# Patient Record
Sex: Female | Born: 1945 | Race: White | Hispanic: No | Marital: Single | Smoking: Never smoker
Health system: Southern US, Community
[De-identification: ages and names within clinical notes are randomized; demographics above are authoritative.]

## PROBLEM LIST (undated history)

## (undated) DIAGNOSIS — T4145XA Adverse effect of unspecified anesthetic, initial encounter: Secondary | ICD-10-CM

## (undated) DIAGNOSIS — F419 Anxiety disorder, unspecified: Secondary | ICD-10-CM

## (undated) DIAGNOSIS — T8149XA Infection following a procedure, other surgical site, initial encounter: Secondary | ICD-10-CM

## (undated) DIAGNOSIS — N8110 Cystocele, unspecified: Secondary | ICD-10-CM

## (undated) DIAGNOSIS — K219 Gastro-esophageal reflux disease without esophagitis: Secondary | ICD-10-CM

## (undated) DIAGNOSIS — F32A Depression, unspecified: Secondary | ICD-10-CM

## (undated) DIAGNOSIS — E46 Unspecified protein-calorie malnutrition: Secondary | ICD-10-CM

## (undated) DIAGNOSIS — K651 Peritoneal abscess: Secondary | ICD-10-CM

## (undated) DIAGNOSIS — N951 Menopausal and female climacteric states: Secondary | ICD-10-CM

## (undated) DIAGNOSIS — M47816 Spondylosis without myelopathy or radiculopathy, lumbar region: Secondary | ICD-10-CM

## (undated) DIAGNOSIS — K623 Rectal prolapse: Secondary | ICD-10-CM

## (undated) DIAGNOSIS — M1990 Unspecified osteoarthritis, unspecified site: Secondary | ICD-10-CM

## (undated) DIAGNOSIS — T8143XA Infection following a procedure, organ and space surgical site, initial encounter: Secondary | ICD-10-CM

## (undated) DIAGNOSIS — K56609 Unspecified intestinal obstruction, unspecified as to partial versus complete obstruction: Secondary | ICD-10-CM

## (undated) DIAGNOSIS — T8859XA Other complications of anesthesia, initial encounter: Secondary | ICD-10-CM

## (undated) DIAGNOSIS — E039 Hypothyroidism, unspecified: Secondary | ICD-10-CM

## (undated) DIAGNOSIS — IMO0002 Reserved for concepts with insufficient information to code with codable children: Secondary | ICD-10-CM

## (undated) DIAGNOSIS — M810 Age-related osteoporosis without current pathological fracture: Secondary | ICD-10-CM

## (undated) HISTORY — PX: TONSILLECTOMY: SUR1361

## (undated) HISTORY — PX: OTHER SURGICAL HISTORY: SHX169

## (undated) HISTORY — PX: ABDOMINAL HYSTERECTOMY: SHX81

## (undated) HISTORY — PX: ABDOMINAL SURGERY: SHX537

---

## 1999-06-29 ENCOUNTER — Other Ambulatory Visit: Admission: RE | Admit: 1999-06-29 | Discharge: 1999-06-29 | Payer: Self-pay | Admitting: Obstetrics and Gynecology

## 2000-07-14 ENCOUNTER — Other Ambulatory Visit: Admission: RE | Admit: 2000-07-14 | Discharge: 2000-07-14 | Payer: Self-pay | Admitting: Internal Medicine

## 2002-09-11 ENCOUNTER — Ambulatory Visit (HOSPITAL_COMMUNITY): Admission: RE | Admit: 2002-09-11 | Discharge: 2002-09-11 | Payer: Self-pay | Admitting: Obstetrics & Gynecology

## 2002-09-11 ENCOUNTER — Encounter: Payer: Self-pay | Admitting: Obstetrics & Gynecology

## 2005-06-21 ENCOUNTER — Ambulatory Visit: Payer: Self-pay | Admitting: Orthopedic Surgery

## 2006-10-06 ENCOUNTER — Encounter: Admission: RE | Admit: 2006-10-06 | Discharge: 2006-10-06 | Payer: Self-pay | Admitting: Obstetrics & Gynecology

## 2007-10-11 ENCOUNTER — Encounter: Admission: RE | Admit: 2007-10-11 | Discharge: 2007-10-11 | Payer: Self-pay | Admitting: Family Medicine

## 2008-10-29 ENCOUNTER — Encounter: Admission: RE | Admit: 2008-10-29 | Discharge: 2008-10-29 | Payer: Self-pay | Admitting: Family Medicine

## 2009-11-03 ENCOUNTER — Encounter: Admission: RE | Admit: 2009-11-03 | Discharge: 2009-11-03 | Payer: Self-pay | Admitting: Obstetrics & Gynecology

## 2010-05-04 ENCOUNTER — Encounter: Payer: Self-pay | Admitting: Family Medicine

## 2010-08-25 ENCOUNTER — Other Ambulatory Visit: Payer: Self-pay | Admitting: Family Medicine

## 2010-08-25 DIAGNOSIS — Z1231 Encounter for screening mammogram for malignant neoplasm of breast: Secondary | ICD-10-CM

## 2010-11-05 ENCOUNTER — Ambulatory Visit
Admission: RE | Admit: 2010-11-05 | Discharge: 2010-11-05 | Disposition: A | Discharge status: Home / Self Care | Payer: BC Managed Care – PPO | Source: Ambulatory Visit | Attending: Family Medicine | Admitting: Family Medicine

## 2010-11-05 DIAGNOSIS — Z1231 Encounter for screening mammogram for malignant neoplasm of breast: Secondary | ICD-10-CM

## 2011-08-30 ENCOUNTER — Other Ambulatory Visit: Payer: Self-pay | Admitting: Family Medicine

## 2011-08-30 DIAGNOSIS — Z1231 Encounter for screening mammogram for malignant neoplasm of breast: Secondary | ICD-10-CM

## 2011-10-07 ENCOUNTER — Telehealth (HOSPITAL_COMMUNITY): Payer: Self-pay | Admitting: Dietician

## 2011-10-07 NOTE — Telephone Encounter (Signed)
Pt called inquiring about services. She reports that she is pre-diabetic. Offered diabetes class to pt, but pt declined. Provided simple guidance on diabetic diet over telephone, emphasizing plate method and sources of carbohydrates. Discussed nutritional content of commonly eaten foods. Encouraged pt to limit foods in refined carbohydrates in diet. Pt also requesting written information to be sent to her home. Sent plate method handout and diabetes class flyer to pt home via [place] [employer].

## 2011-11-08 ENCOUNTER — Ambulatory Visit
Admission: RE | Admit: 2011-11-08 | Discharge: 2011-11-08 | Disposition: A | Discharge status: Home / Self Care | Payer: Medicare Other | Source: Ambulatory Visit | Attending: Family Medicine | Admitting: Family Medicine

## 2011-11-08 DIAGNOSIS — Z1231 Encounter for screening mammogram for malignant neoplasm of breast: Secondary | ICD-10-CM

## 2011-12-06 ENCOUNTER — Other Ambulatory Visit (HOSPITAL_COMMUNITY): Payer: Self-pay | Admitting: Family Medicine

## 2011-12-06 DIAGNOSIS — Z139 Encounter for screening, unspecified: Secondary | ICD-10-CM

## 2011-12-07 ENCOUNTER — Ambulatory Visit (HOSPITAL_COMMUNITY)
Admission: RE | Admit: 2011-12-07 | Discharge: 2011-12-07 | Disposition: A | Discharge status: Home / Self Care | Payer: Medicare Other | Source: Ambulatory Visit | Attending: Family Medicine | Admitting: Family Medicine

## 2011-12-07 DIAGNOSIS — Z139 Encounter for screening, unspecified: Secondary | ICD-10-CM

## 2011-12-07 DIAGNOSIS — M818 Other osteoporosis without current pathological fracture: Secondary | ICD-10-CM | POA: Insufficient documentation

## 2011-12-07 DIAGNOSIS — Z13820 Encounter for screening for osteoporosis: Secondary | ICD-10-CM | POA: Insufficient documentation

## 2011-12-08 ENCOUNTER — Other Ambulatory Visit (HOSPITAL_COMMUNITY): Payer: Medicare Other

## 2011-12-22 DIAGNOSIS — R109 Unspecified abdominal pain: Secondary | ICD-10-CM

## 2012-01-10 ENCOUNTER — Encounter (HOSPITAL_COMMUNITY): Payer: Self-pay | Admitting: *Deleted

## 2012-01-10 ENCOUNTER — Emergency Department (HOSPITAL_COMMUNITY): Payer: Medicare Other

## 2012-01-10 ENCOUNTER — Inpatient Hospital Stay (HOSPITAL_COMMUNITY)
Admission: EM | Admit: 2012-01-10 | Discharge: 2012-01-18 | DRG: 862 | Disposition: A | Discharge status: Home Health | Payer: Medicare Other | Attending: Internal Medicine | Admitting: Internal Medicine

## 2012-01-10 DIAGNOSIS — M7989 Other specified soft tissue disorders: Secondary | ICD-10-CM

## 2012-01-10 DIAGNOSIS — T8140XA Infection following a procedure, unspecified, initial encounter: Principal | ICD-10-CM | POA: Diagnosis present

## 2012-01-10 DIAGNOSIS — E860 Dehydration: Secondary | ICD-10-CM | POA: Diagnosis present

## 2012-01-10 DIAGNOSIS — D649 Anemia, unspecified: Secondary | ICD-10-CM

## 2012-01-10 DIAGNOSIS — Z885 Allergy status to narcotic agent status: Secondary | ICD-10-CM

## 2012-01-10 DIAGNOSIS — D473 Essential (hemorrhagic) thrombocythemia: Secondary | ICD-10-CM | POA: Diagnosis present

## 2012-01-10 DIAGNOSIS — R799 Abnormal finding of blood chemistry, unspecified: Secondary | ICD-10-CM | POA: Diagnosis present

## 2012-01-10 DIAGNOSIS — E876 Hypokalemia: Secondary | ICD-10-CM | POA: Diagnosis present

## 2012-01-10 DIAGNOSIS — Y836 Removal of other organ (partial) (total) as the cause of abnormal reaction of the patient, or of later complication, without mention of misadventure at the time of the procedure: Secondary | ICD-10-CM | POA: Diagnosis present

## 2012-01-10 DIAGNOSIS — Y92009 Unspecified place in unspecified non-institutional (private) residence as the place of occurrence of the external cause: Secondary | ICD-10-CM

## 2012-01-10 DIAGNOSIS — M810 Age-related osteoporosis without current pathological fracture: Secondary | ICD-10-CM | POA: Diagnosis present

## 2012-01-10 DIAGNOSIS — D509 Iron deficiency anemia, unspecified: Secondary | ICD-10-CM | POA: Diagnosis present

## 2012-01-10 DIAGNOSIS — R609 Edema, unspecified: Secondary | ICD-10-CM | POA: Diagnosis not present

## 2012-01-10 DIAGNOSIS — A498 Other bacterial infections of unspecified site: Secondary | ICD-10-CM | POA: Diagnosis present

## 2012-01-10 DIAGNOSIS — Z90710 Acquired absence of both cervix and uterus: Secondary | ICD-10-CM

## 2012-01-10 DIAGNOSIS — K651 Peritoneal abscess: Secondary | ICD-10-CM | POA: Diagnosis present

## 2012-01-10 DIAGNOSIS — E43 Unspecified severe protein-calorie malnutrition: Secondary | ICD-10-CM | POA: Diagnosis present

## 2012-01-10 DIAGNOSIS — K649 Unspecified hemorrhoids: Secondary | ICD-10-CM | POA: Diagnosis present

## 2012-01-10 DIAGNOSIS — M1990 Unspecified osteoarthritis, unspecified site: Secondary | ICD-10-CM | POA: Diagnosis present

## 2012-01-10 DIAGNOSIS — R197 Diarrhea, unspecified: Secondary | ICD-10-CM | POA: Diagnosis present

## 2012-01-10 DIAGNOSIS — R109 Unspecified abdominal pain: Secondary | ICD-10-CM

## 2012-01-10 DIAGNOSIS — Z79899 Other long term (current) drug therapy: Secondary | ICD-10-CM

## 2012-01-10 DIAGNOSIS — Z7982 Long term (current) use of aspirin: Secondary | ICD-10-CM

## 2012-01-10 DIAGNOSIS — Y9210 Unspecified residential institution as the place of occurrence of the external cause: Secondary | ICD-10-CM | POA: Diagnosis present

## 2012-01-10 DIAGNOSIS — E46 Unspecified protein-calorie malnutrition: Secondary | ICD-10-CM | POA: Diagnosis present

## 2012-01-10 DIAGNOSIS — IMO0002 Reserved for concepts with insufficient information to code with codable children: Secondary | ICD-10-CM

## 2012-01-10 HISTORY — DX: Unspecified osteoarthritis, unspecified site: M19.90

## 2012-01-10 HISTORY — DX: Age-related osteoporosis without current pathological fracture: M81.0

## 2012-01-10 LAB — URINALYSIS, ROUTINE W REFLEX MICROSCOPIC
Leukocytes, UA: NEGATIVE
Nitrite: NEGATIVE
Specific Gravity, Urine: 1.01 (ref 1.005–1.030)
pH: 6.5 (ref 5.0–8.0)

## 2012-01-10 LAB — COMPREHENSIVE METABOLIC PANEL
Albumin: 1.9 g/dL — ABNORMAL LOW (ref 3.5–5.2)
Alkaline Phosphatase: 106 U/L (ref 39–117)
BUN: 7 mg/dL (ref 6–23)
Chloride: 91 mEq/L — ABNORMAL LOW (ref 96–112)
Glucose, Bld: 98 mg/dL (ref 70–99)
Potassium: 2 mEq/L — CL (ref 3.5–5.1)
Total Bilirubin: 0.3 mg/dL (ref 0.3–1.2)

## 2012-01-10 LAB — CBC WITH DIFFERENTIAL/PLATELET
Basophils Relative: 0 % (ref 0–1)
Hemoglobin: 10.4 g/dL — ABNORMAL LOW (ref 12.0–15.0)
Lymphs Abs: 1 10*3/uL (ref 0.7–4.0)
Monocytes Relative: 8 % (ref 3–12)
Neutro Abs: 14.2 10*3/uL — ABNORMAL HIGH (ref 1.7–7.7)
Neutrophils Relative %: 86 % — ABNORMAL HIGH (ref 43–77)
RBC: 3.59 MIL/uL — ABNORMAL LOW (ref 3.87–5.11)

## 2012-01-10 LAB — LIPASE, BLOOD: Lipase: 64 U/L — ABNORMAL HIGH (ref 11–59)

## 2012-01-10 MED ORDER — HYDROMORPHONE HCL PF 1 MG/ML IJ SOLN
1.0000 mg | Freq: Once | INTRAMUSCULAR | Status: AC
  Administered 2012-01-10: 1 mg via INTRAVENOUS
  Filled 2012-01-10: qty 1

## 2012-01-10 MED ORDER — SODIUM CHLORIDE 0.9 % IV BOLUS (SEPSIS)
1000.0000 mL | Freq: Once | INTRAVENOUS | Status: AC
  Administered 2012-01-10: 1000 mL via INTRAVENOUS

## 2012-01-10 MED ORDER — POTASSIUM CHLORIDE 10 MEQ/100ML IV SOLN
10.0000 meq | Freq: Once | INTRAVENOUS | Status: AC
  Administered 2012-01-11: 10 meq via INTRAVENOUS
  Filled 2012-01-10: qty 100

## 2012-01-10 MED ORDER — SODIUM CHLORIDE 0.9 % IV SOLN
INTRAVENOUS | Status: DC
  Administered 2012-01-10: 23:00:00 via INTRAVENOUS

## 2012-01-10 MED ORDER — ONDANSETRON HCL 4 MG/2ML IJ SOLN
4.0000 mg | Freq: Once | INTRAMUSCULAR | Status: AC
  Administered 2012-01-10: 4 mg via INTRAVENOUS
  Filled 2012-01-10: qty 2

## 2012-01-10 MED ORDER — IOHEXOL 300 MG/ML  SOLN
100.0000 mL | Freq: Once | INTRAMUSCULAR | Status: AC | PRN
  Administered 2012-01-10: 100 mL via INTRAVENOUS

## 2012-01-10 NOTE — ED Provider Notes (Addendum)
History    This chart was scribed for Shelda Jakes, MD, MD by Smitty Pluck. The patient was seen in room APA09 and the patient's care was started at 9:20PM.   CSN: [id number]  Arrival date & time 01/10/12  1756      Chief Complaint  Patient presents with  . Abdominal Pain    (Consider location/radiation/quality/duration/timing/severity/associated sxs/prior treatment) Patient is a 66 y.o. female presenting with abdominal pain. The history is provided by the patient. No language interpreter was used.  Abdominal Pain The primary symptoms of the illness include abdominal pain, fever, nausea and diarrhea. The primary symptoms of the illness do not include shortness of breath. The current episode started more than 2 days ago. The onset of the illness was sudden. The problem has been gradually worsening.  The fever began more than 1 week ago. The maximum temperature recorded prior to her arrival was 100 to 100.9 F.  Nausea began more than 1 week ago.  The diarrhea began more than 1 week ago. The diarrhea is watery. The diarrhea occurs 5 to 10 times per day.   Yvette Branch is a 66 y.o. female who presents to the Emergency Department complaining of constant, moderate abdominal pain, fever and diarrhea onset 2 weeks ago. Pt has had 6 bowel movements today. Pt had home nurse come to her house today and was told her fever had temperature 100 today.  Pt had bowel obstruction surgery (adhesions) 10 days ago by [facility] at [place]. The symptoms have gotten worse since the surgery. Pt reports having decreased appetite. She states her abdominal pain is aggravated when she urinates. Pt reports having hysterectomy in the past.   PCP is Dr. Phillips Odor   Past Medical History  Diagnosis Date  . Arthritis   . Osteoporosis     Past Surgical History  Procedure Date  . Abdominal hysterectomy   . Abdominal surgery   . Bowel obstruction     History reviewed. No pertinent family history.  History    Substance Use Topics  . Smoking status: Never Smoker   . Smokeless tobacco: Not on file  . Alcohol Use: No    OB History    Grav Para Term Preterm Abortions TAB SAB Ect Mult Living                  Review of Systems  Constitutional: Positive for fever.  HENT: Negative for congestion and sore throat.   Respiratory: Negative for cough and shortness of breath.   Gastrointestinal: Positive for nausea, abdominal pain and diarrhea.  Skin: Negative for rash.  Neurological: Negative for numbness and headaches.  All other systems reviewed and are negative.    Allergies  Hydrocodone  Home Medications     BP 126/69  Pulse 86  Temp 101.5 F (38.6 C) (Rectal)  Resp 17  Ht 5\' 2"  (1.575 m)  Wt 100 lb (45.36 kg)  BMI 18.29 kg/m2  SpO2 96%  Physical Exam  Nursing note and vitals reviewed. Constitutional: She is oriented to person, place, and time. She appears well-developed and well-nourished. No distress.  HENT:  Head: Normocephalic and atraumatic.  Mouth/Throat: Mucous membranes are dry.  Cardiovascular: Normal rate, regular rhythm and normal heart sounds.   No murmur heard. Pulmonary/Chest: Effort normal and breath sounds normal. No respiratory distress. She has no wheezes. She has no rales.  Abdominal: Bowel sounds are decreased. There is no tenderness.       8 cm long incision  No redness No signs of infection   Musculoskeletal: Normal range of motion.  Lymphadenopathy:    She has no cervical adenopathy.  Neurological: She is alert and oriented to person, place, and time. No cranial nerve deficit.  Skin: Skin is warm and dry.  Psychiatric: She has a normal mood and affect. Her behavior is normal.    ED Course  Procedures (including critical care time) DIAGNOSTIC STUDIES: Oxygen Saturation is 98% on room air, normal by my interpretation.    COORDINATION OF CARE: 9:35 PM Discussed ED treatment with pt     Labs Reviewed  URINALYSIS, ROUTINE W REFLEX  MICROSCOPIC - Abnormal; Notable for the following:    Ketones, ur 15 (*)     All other components within normal limits  CBC WITH DIFFERENTIAL - Abnormal; Notable for the following:    WBC 16.6 (*)     RBC 3.59 (*)     Hemoglobin 10.4 (*)     HCT 29.9 (*)     Platelets 565 (*)     Neutrophils Relative 86 (*)     Neutro Abs 14.2 (*)     Lymphocytes Relative 6 (*)     Monocytes Absolute 1.3 (*)     All other components within normal limits  COMPREHENSIVE METABOLIC PANEL - Abnormal; Notable for the following:    Sodium 133 (*)     Potassium 2.0 (*)     Chloride 91 (*)     Creatinine, Ser 0.40 (*)     Total Protein 5.6 (*)     Albumin 1.9 (*)     All other components within normal limits  LIPASE, BLOOD - Abnormal; Notable for the following:    Lipase 64 (*)     All other components within normal limits   No results found. Results for orders placed during the hospital encounter of 01/10/12  URINALYSIS, ROUTINE W REFLEX MICROSCOPIC      Component Value Range   Color, Urine YELLOW  YELLOW   APPearance CLEAR  CLEAR   Specific Gravity, Urine 1.010  1.005 - 1.030   pH 6.5  5.0 - 8.0   Glucose, UA NEGATIVE  NEGATIVE mg/dL   Hgb urine dipstick NEGATIVE  NEGATIVE   Bilirubin Urine NEGATIVE  NEGATIVE   Ketones, ur 15 (*) NEGATIVE mg/dL   Protein, ur NEGATIVE  NEGATIVE mg/dL   Urobilinogen, UA 0.2  0.0 - 1.0 mg/dL   Nitrite NEGATIVE  NEGATIVE   Leukocytes, UA NEGATIVE  NEGATIVE  CBC WITH DIFFERENTIAL      Component Value Range   WBC 16.6 (*) 4.0 - 10.5 K/uL   RBC 3.59 (*) 3.87 - 5.11 MIL/uL   Hemoglobin 10.4 (*) 12.0 - 15.0 g/dL   HCT 16.1 (*) 09.6 - 04.5 %   MCV 83.3  78.0 - 100.0 fL   MCH 29.0  26.0 - 34.0 pg   MCHC 34.8  30.0 - 36.0 g/dL   RDW 40.9  81.1 - 91.4 %   Platelets 565 (*) 150 - 400 K/uL   Neutrophils Relative 86 (*) 43 - 77 %   Neutro Abs 14.2 (*) 1.7 - 7.7 K/uL   Lymphocytes Relative 6 (*) 12 - 46 %   Lymphs Abs 1.0  0.7 - 4.0 K/uL   Monocytes Relative 8  3 -  12 %   Monocytes Absolute 1.3 (*) 0.1 - 1.0 K/uL   Eosinophils Relative 0  0 - 5 %   Eosinophils Absolute 0.0  0.0 -  0.7 K/uL   Basophils Relative 0  0 - 1 %   Basophils Absolute 0.0  0.0 - 0.1 K/uL  COMPREHENSIVE METABOLIC PANEL      Component Value Range   Sodium 133 (*) 135 - 145 mEq/L   Potassium 2.0 (*) 3.5 - 5.1 mEq/L   Chloride 91 (*) 96 - 112 mEq/L   CO2 32  19 - 32 mEq/L   Glucose, Bld 98  70 - 99 mg/dL   BUN 7  6 - 23 mg/dL   Creatinine, Ser 1.61 (*) 0.50 - 1.10 mg/dL   Calcium 8.4  8.4 - 09.6 mg/dL   Total Protein 5.6 (*) 6.0 - 8.3 g/dL   Albumin 1.9 (*) 3.5 - 5.2 g/dL   AST 22  0 - 37 U/L   ALT 19  0 - 35 U/L   Alkaline Phosphatase 106  39 - 117 U/L   Total Bilirubin 0.3  0.3 - 1.2 mg/dL   GFR calc non Af Amer >90  >90 mL/min   GFR calc Af Amer >90  >90 mL/min  LIPASE, BLOOD      Component Value Range   Lipase 64 (*) 11 - 59 U/L    Date: 01/10/2012  Rate: 100  Rhythm: normal sinus rhythm  QRS Axis: normal  Intervals: normal  ST/T Wave abnormalities: nonspecific T wave changes  Conduction Disutrbances:none  Narrative Interpretation:   Old EKG Reviewed: none available    1. Abdominal pain   2. Hypokalemia       MDM  And patient's CT scan is still pending. It will be important in sorting out was going with abdominal pain nausea and diarrhea and the history of fever at home. Patient has marked hypokalemia along with hyponatremia hypokalemia is being treated with 10 mEq of potassium chloride x2. Patient is on cardiac monitor. Patient also has a moderate leukocytosis of 16,000. Patient will require admission even if CT scan is negative. Pending results of CT scan the surgical consultation may be required. Patient's surgery was done at [facility]. For the hypernatremia patient's been treated with 1 L of normal saline and has a maintenance rate as well to slowly correct that. Patient's urinalysis is negative liver function tests without sniffing abnormalities of the  lipase being mildly elevated at 64. This could represent some mild pancreatitis.     I personally performed the services described in this documentation, which was scribed in my presence. The recorded information has been reviewed and considered.      Shelda Jakes, MD 01/10/12 2354   Addendum: CT scan shows a large pelvic abscess measuring about 10 cm in size. Discussed with her surgeon in the evening he's okay with her being admitted here contacted Franky Macho from general surgery for consultation purposes also discussed patient with the admitting hospitalist we will admit here to step down unit have ordered blood cultures x2 lactate and pro calcitonin. Hospitals will decide which antibiotics. The pelvic abscess will most likely be drained percutaneously via CT direction by interventional radiology. All this is explained to the patient and she is in agreement with staying here. Patient did develop a fever here.  Shelda Jakes, MD 01/11/12 0120

## 2012-01-10 NOTE — ED Notes (Signed)
abd pain,fever, diarrhea, nausea, no vomiting, Had surgery for bowel obst at [facility] 2 weeks ago

## 2012-01-11 ENCOUNTER — Encounter (HOSPITAL_COMMUNITY): Payer: Self-pay | Admitting: Internal Medicine

## 2012-01-11 DIAGNOSIS — T8140XA Infection following a procedure, unspecified, initial encounter: Principal | ICD-10-CM

## 2012-01-11 DIAGNOSIS — E876 Hypokalemia: Secondary | ICD-10-CM | POA: Diagnosis present

## 2012-01-11 DIAGNOSIS — E860 Dehydration: Secondary | ICD-10-CM | POA: Diagnosis present

## 2012-01-11 DIAGNOSIS — D509 Iron deficiency anemia, unspecified: Secondary | ICD-10-CM | POA: Diagnosis present

## 2012-01-11 DIAGNOSIS — E46 Unspecified protein-calorie malnutrition: Secondary | ICD-10-CM | POA: Diagnosis present

## 2012-01-11 DIAGNOSIS — R109 Unspecified abdominal pain: Secondary | ICD-10-CM

## 2012-01-11 DIAGNOSIS — K651 Peritoneal abscess: Secondary | ICD-10-CM

## 2012-01-11 DIAGNOSIS — R197 Diarrhea, unspecified: Secondary | ICD-10-CM

## 2012-01-11 LAB — IRON AND TIBC: Iron: <10 ug/dL — ABNORMAL LOW (ref 42–135)

## 2012-01-11 LAB — COMPREHENSIVE METABOLIC PANEL
CO2: 29 mEq/L (ref 19–32)
Calcium: 7.6 mg/dL — ABNORMAL LOW (ref 8.4–10.5)
Chloride: 94 mEq/L — ABNORMAL LOW (ref 96–112)
Creatinine, Ser: 0.4 mg/dL — ABNORMAL LOW (ref 0.50–1.10)
GFR calc Af Amer: >90 mL/min (ref >90)
GFR calc non Af Amer: >90 mL/min (ref >90)
Glucose, Bld: 107 mg/dL — ABNORMAL HIGH (ref 70–99)
Total Bilirubin: 0.3 mg/dL (ref 0.3–1.2)

## 2012-01-11 LAB — CBC
Hemoglobin: 9.3 g/dL — ABNORMAL LOW (ref 12.0–15.0)
MCHC: 34.6 g/dL (ref 30.0–36.0)
Platelets: 566 10*3/uL — ABNORMAL HIGH (ref 150–400)
RBC: 3.2 MIL/uL — ABNORMAL LOW (ref 3.87–5.11)

## 2012-01-11 LAB — APTT: aPTT: 33 seconds (ref 24–37)

## 2012-01-11 LAB — FOLATE: Folate: 11.6 ng/mL

## 2012-01-11 LAB — LACTIC ACID, PLASMA: Lactic Acid, Venous: 0.8 mmol/L (ref 0.5–2.2)

## 2012-01-11 LAB — PROCALCITONIN: Procalcitonin: 4.22 ng/mL

## 2012-01-11 LAB — PROTIME-INR: INR: 1.08 (ref 0.00–1.49)

## 2012-01-11 LAB — VITAMIN B12: Vitamin B-12: 1534 pg/mL — ABNORMAL HIGH (ref 211–911)

## 2012-01-11 MED ORDER — ACETAMINOPHEN 325 MG PO TABS: 650.0000 mg | ORAL_TABLET | Freq: Four times a day (QID) | ORAL | Status: DC | PRN

## 2012-01-11 MED ORDER — ONDANSETRON HCL 4 MG PO TABS
4.0000 mg | ORAL_TABLET | Freq: Four times a day (QID) | ORAL | Status: DC | PRN
  Administered 2012-01-13: 4 mg via ORAL
  Filled 2012-01-11: qty 1

## 2012-01-11 MED ORDER — ACETAMINOPHEN 650 MG RE SUPP: 650.0000 mg | Freq: Four times a day (QID) | RECTAL | Status: DC | PRN

## 2012-01-11 MED ORDER — MAGNESIUM SULFATE 40 MG/ML IJ SOLN
2.0000 g | Freq: Once | INTRAMUSCULAR | Status: AC
  Administered 2012-01-11: 2 g via INTRAVENOUS
  Filled 2012-01-11: qty 50

## 2012-01-11 MED ORDER — PIPERACILLIN-TAZOBACTAM 3.375 G IVPB
3.3750 g | Freq: Three times a day (TID) | INTRAVENOUS | Status: DC
  Administered 2012-01-11 – 2012-01-15 (×13): 3.375 g via INTRAVENOUS
  Filled 2012-01-11 (×20): qty 50

## 2012-01-11 MED ORDER — METRONIDAZOLE IN NACL 5-0.79 MG/ML-% IV SOLN
500.0000 mg | Freq: Three times a day (TID) | INTRAVENOUS | Status: DC
  Administered 2012-01-11 – 2012-01-14 (×8): 500 mg via INTRAVENOUS
  Filled 2012-01-11 (×10): qty 100

## 2012-01-11 MED ORDER — POTASSIUM CHLORIDE 10 MEQ/100ML IV SOLN
10.0000 meq | INTRAVENOUS | Status: AC
  Administered 2012-01-11 (×4): 10 meq via INTRAVENOUS
  Filled 2012-01-11: qty 300
  Filled 2012-01-11: qty 100

## 2012-01-11 MED ORDER — PIPERACILLIN-TAZOBACTAM 3.375 G IVPB: 3.3750 g | Freq: Once | INTRAVENOUS | Status: DC

## 2012-01-11 MED ORDER — ONDANSETRON HCL 4 MG/2ML IJ SOLN
4.0000 mg | Freq: Four times a day (QID) | INTRAMUSCULAR | Status: DC | PRN
  Administered 2012-01-12 – 2012-01-14 (×7): 4 mg via INTRAVENOUS
  Filled 2012-01-11 (×8): qty 2

## 2012-01-11 MED ORDER — POTASSIUM CHLORIDE IN NACL 20-0.9 MEQ/L-% IV SOLN
INTRAVENOUS | Status: DC
  Administered 2012-01-11: 100 mL/h via INTRAVENOUS
  Administered 2012-01-11 – 2012-01-12 (×4): via INTRAVENOUS
  Administered 2012-01-13: 1000 mL via INTRAVENOUS
  Administered 2012-01-14: 20 mL/h via INTRAVENOUS

## 2012-01-11 MED ORDER — ALPRAZOLAM 0.25 MG PO TABS
0.2500 mg | ORAL_TABLET | Freq: Four times a day (QID) | ORAL | Status: DC | PRN
  Administered 2012-01-11 – 2012-01-18 (×20): 0.25 mg via ORAL
  Filled 2012-01-11 (×22): qty 1

## 2012-01-11 MED ORDER — VANCOMYCIN HCL 1000 MG IV SOLR
750.0000 mg | Freq: Two times a day (BID) | INTRAVENOUS | Status: DC
  Administered 2012-01-11 – 2012-01-12 (×2): 750 mg via INTRAVENOUS
  Filled 2012-01-11 (×4): qty 750

## 2012-01-11 MED ORDER — HYDROMORPHONE HCL PF 1 MG/ML IJ SOLN
1.0000 mg | INTRAMUSCULAR | Status: DC | PRN
  Administered 2012-01-11 – 2012-01-18 (×19): 1 mg via INTRAVENOUS
  Filled 2012-01-11 (×4): qty 1
  Filled 2012-01-11: qty 5
  Filled 2012-01-11 (×3): qty 1
  Filled 2012-01-11: qty 10
  Filled 2012-01-11: qty 5
  Filled 2012-01-11 (×4): qty 1
  Filled 2012-01-11: qty 15
  Filled 2012-01-11: qty 1
  Filled 2012-01-11: qty 15
  Filled 2012-01-11: qty 1
  Filled 2012-01-11: qty 15
  Filled 2012-01-11: qty 1
  Filled 2012-01-11: qty 15
  Filled 2012-01-11 (×2): qty 1
  Filled 2012-01-11: qty 20
  Filled 2012-01-11: qty 1
  Filled 2012-01-11: qty 20
  Filled 2012-01-11 (×2): qty 10
  Filled 2012-01-11: qty 1
  Filled 2012-01-11 (×2): qty 15
  Filled 2012-01-11 (×2): qty 1

## 2012-01-11 MED ORDER — SODIUM CHLORIDE 0.9 % IJ SOLN
3.0000 mL | Freq: Two times a day (BID) | INTRAMUSCULAR | Status: DC
  Administered 2012-01-11 – 2012-01-12 (×2): 3 mL via INTRAVENOUS

## 2012-01-11 MED ORDER — POTASSIUM CHLORIDE 10 MEQ/100ML IV SOLN
10.0000 meq | INTRAVENOUS | Status: AC
  Administered 2012-01-11 (×4): 10 meq via INTRAVENOUS
  Filled 2012-01-11: qty 400

## 2012-01-11 MED ORDER — VANCOMYCIN HCL IN DEXTROSE 1-5 GM/200ML-% IV SOLN
1000.0000 mg | Freq: Once | INTRAVENOUS | Status: DC
  Administered 2012-01-11: 1000 mg via INTRAVENOUS
  Filled 2012-01-11: qty 200

## 2012-01-11 MED ORDER — SODIUM CHLORIDE 0.9 % IV SOLN: INTRAVENOUS | Status: DC

## 2012-01-11 MED ORDER — ALBUTEROL SULFATE (5 MG/ML) 0.5% IN NEBU: 2.5000 mg | INHALATION_SOLUTION | RESPIRATORY_TRACT | Status: DC | PRN

## 2012-01-11 NOTE — Progress Notes (Signed)
Patient admitted earlier this morning by Dr. Rito Ehrlich.  Patient seen and examined, database reviewed.  She was recently admitted and treated at [facility] for a bowel obstruction and undergone surgical intervention. She had gone home, had continued abdominal pain, and developed diarrhea. She came back to the emergency room where she was noted to be markedly hypokalemic, had a significant leukocytosis, and CT scan of the abdomen and pelvis indicated intra-abdominal abscess. She's been started on broad-spectrum antibiotics. Stool C. difficile pending. We'll empirically start the patient on Flagyl. Potassium is being replaced. We'll await further recommendations surgery.

## 2012-01-11 NOTE — Progress Notes (Signed)
ANTIBIOTIC CONSULT NOTE - INITIAL  Pharmacy Consult for Zosyn & vancomycin Indication:   Intra-abdominal abcess  Allergies  Allergen Reactions  . Hydrocodone Other (See Comments)    REACTION: causes pains in stomach    Patient Measurements: Height: 5\' 2"  (157.5 cm) Weight: 118 lb 9.7 oz (53.8 kg) IBW/kg (Calculated) : 50.1    Vital Signs: Temp: 101.5 F (38.6 C) (09/30 2250) Temp src: Rectal (09/30 2250) BP: 124/64 mmHg (10/01 0115) Pulse Rate: 99  (10/01 0300) Intake/Output from previous day:   Intake/Output from this shift:    Labs:  [facility] 01/10/12 2152  WBC 16.6*  HGB 10.4*  PLT 565*  LABCREA --  CREATININE 0.40*   Estimated Creatinine Clearance: 55.4 ml/min (by C-G formula based on Cr of 0.4).   Microbiology: Recent Results (from the past 720 hour(s))  CULTURE, BLOOD (ROUTINE X 2)     Status: Normal (Preliminary result)   Collection Time   01/11/12  1:11 AM      Component Value Range Status Comment   Specimen Description Blood RIGHT ARM   Final    Special Requests BOTTLES DRAWN AEROBIC AND ANAEROBIC 6 CC EACH   Final    Culture PENDING   Incomplete    Report Status PENDING   Incomplete   CULTURE, BLOOD (ROUTINE X 2)     Status: Normal (Preliminary result)   Collection Time   01/11/12  1:12 AM      Component Value Range Status Comment   Specimen Description Blood LEFT HAND   Final    Special Requests BOTTLES DRAWN AEROBIC AND ANAEROBIC 6 CC EACH   Final    Culture PENDING   Incomplete    Report Status PENDING   Incomplete     Medical History: Past Medical History  Diagnosis Date  . Arthritis   . Osteoporosis     Medications:  Scheduled:    . HYDROmorphone  1 mg Intravenous Once  . ondansetron  4 mg Intravenous Once  . potassium chloride  10 mEq Intravenous Once  . potassium chloride  10 mEq Intravenous Once  . potassium chloride  10 mEq Intravenous Q1 Hr x 4  . sodium chloride  1,000 mL Intravenous Once  . sodium chloride  3 mL  Intravenous Q12H  . DISCONTD: sodium chloride   Intravenous STAT  . DISCONTD: piperacillin-tazobactam (ZOSYN)  IV  3.375 g Intravenous Once  . DISCONTD: vancomycin  1,000 mg Intravenous Once   Infusions:    . 0.9 % NaCl with KCl 20 mEq / L    . DISCONTD: sodium chloride 100 mL/hr at 01/10/12 2314   PRN: acetaminophen, acetaminophen, albuterol, ALPRAZolam, HYDROmorphone (DILAUDID) injection, iohexol, ondansetron (ZOFRAN) IV, ondansetron Assessment: Patient s/p abdominal surgery, re-admitted with radiological findings consistent with intra-abdominal abcess and possible atelectasis.  Noted that patient also malnourished with serum albumin = 1.9 (therefore expecting larger volume of distribution of  Vancomycin with possible capillary leaking & increased interstitial fluid pool).  Goal of Therapy:  Aggressive protocol for vancomycin with a Vd 1l/kg or higher suggests maintenance regimen 750mg  q12h to keep trough >15-53mcg/ml.  Zosyn protocol for CrCl.74ml/min remains 3.375gm IV q8h.  Plan:  1.  Start maintenance vancomycin regimen 750mg  IV q12h and will check serum vancomycin level prior to 3rd dose Wednesday afternoon. 2.  Start maintenance regimen Zosyn 3.375gm IV q8h  Shirley Muscat E 01/11/2012,3:45 AM

## 2012-01-11 NOTE — Evaluation (Signed)
Occupational Therapy Evaluation Patient Details Name: Yvette Branch MRN: [id number] DOB: 21-Apr-1945 Today's Date: 01/11/2012 Time: 1191-4782 OT Time Calculation (min): 22 min  OT Assessment / Plan / Recommendation Clinical Impression  Patient is a 66 y/o female s/p Intra-abdominal abcess post procedure presenting to acute OT with all education complete. Patient lives alone. Recommended patient purchase toilet seat riser and shower chair with back to increase safety and functional performance at home. Informed patient of vendors where DME may be purchased. Educated patient on Bil UE exercises that she could perform at home to strengthen triceps. Patient verbalized understanding. Patient does not require OT srevices at this time; will sign off.     OT Assessment  Patient does not need any further OT services    Follow Up Recommendations  No OT follow up       Equipment Recommendations  Tub/shower seat;Toilet rise with handles          Precautions / Restrictions Precautions Precautions: None Restrictions Weight Bearing Restrictions: No   Pertinent Vitals/Pain No report of pain.    ADL  Lower Body Dressing: Performed;Supervision/safety Where Assessed - Lower Body Dressing: Unsupported sitting Toilet Transfer: Performed;Supervision/safety Toilet Transfer Method: Other (comment) (ambulating) Toilet Transfer Equipment: Bedside commode Toileting - Clothing Manipulation and Hygiene: Performed;Supervision/safety Where Assessed - Toileting Clothing Manipulation and Hygiene: Standing Transfers/Ambulation Related to ADLs: Patient transfers at Supervision level w/o AD.      Visit Information  Last OT Received On: 01/11/12 Assistance Needed: +1    Subjective Data  Subjective: "I'm doing just fine." Patient Stated Goal: To go home.   Prior Functioning     Home Living Lives With: Alone Available Help at Discharge: Family;Available PRN/intermittently Type of Home: House Home  Access: Level entry Home Layout: One level Bathroom Shower/Tub: Tub/shower unit;Curtain Firefighter: Standard Home Adaptive Equipment: None Prior Function Level of Independence: Independent Able to Take Stairs?: Yes Driving: Yes Vocation: Retired Musician: No difficulties Dominant Hand: Right            Cognition  Overall Cognitive Status: Appears within functional limits for tasks assessed/performed Arousal/Alertness: Awake/alert Orientation Level: Appears intact for tasks assessed Behavior During Session: [facility] for tasks performed    Extremity/Trunk Assessment Right Upper Extremity Assessment RUE ROM/Strength/Tone: Deficits RUE ROM/Strength/Tone Deficits: A/ROM WFL in all ranges. MMT: 4/5 RUE Sensation: WFL - Light Touch;WFL - Proprioception RUE Coordination: WFL - gross/fine motor Left Upper Extremity Assessment LUE ROM/Strength/Tone: Deficits LUE ROM/Strength/Tone Deficits: A/ROM WFL in all ranges. MMT: 4/5 LUE Sensation: WFL - Light Touch;WFL - Proprioception LUE Coordination: WFL - gross/fine motor     Mobility  Transfers Transfers: Stand to Sit;Sit to Stand Sit to Stand: 5: Supervision;From chair/3-in-1;With upper extremity assist Stand to Sit: 5: Supervision;With upper extremity assist;To chair/3-in-1                End of Session OT - End of Session Activity Tolerance: Patient tolerated treatment well Patient left: with call bell/phone within reach;in chair    Limmie Patricia, OTR/L 01/11/2012, 2:22 PM

## 2012-01-11 NOTE — Progress Notes (Signed)
Dr Lovell Sheehan paged and made aware of surgery consult at 0930.

## 2012-01-11 NOTE — Evaluation (Signed)
Physical Therapy Evaluation Patient Details Name: Yvette Branch MRN: [id number] DOB: 19-Nov-1945 Today's Date: 01/11/2012 Time: 4098-1191 PT Time Calculation (min): 56 min  PT Assessment / Plan / Recommendation Clinical Impression  Pt was seen for eval and not found to have any functional problems. She plans to have family support when going home.  I do not see any PT or DME needs    PT Assessment  Patent does not need any further PT services    Follow Up Recommendations  No PT follow up    Barriers to Discharge        Equipment Recommendations  None recommended by PT    Recommendations for Other Services     Frequency      Precautions / Restrictions Precautions Precautions: None Restrictions Weight Bearing Restrictions: No   Pertinent Vitals/Pain       Mobility  Bed Mobility Bed Mobility: Supine to Sit;Sit to Supine Supine to Sit: HOB elevated;5: Supervision Sit to Supine: Not Tested (comment) Transfers Transfers: Stand Pivot Transfers;Stand to Sit;Sit to Stand Sit to Stand: 5: Supervision;With upper extremity assist;From bed Stand to Sit: 5: Supervision;With upper extremity assist;To chair/3-in-1 Stand Pivot Transfers: 5: Supervision Ambulation/Gait Ambulation/Gait Assistance: 6: Modified independent (Device/Increase time) Ambulation Distance (Feet): 300 Feet Assistive device: None Gait Pattern: Within Functional Limits Gait velocity: WNL General Gait Details: no gait abnormalities Stairs: No Wheelchair Mobility Wheelchair Mobility: No    Shoulder Instructions     Exercises General Exercises - Lower Extremity Ankle Circles/Pumps: Strengthening;Both;10 reps;Supine Quad Sets: AROM;Both;10 reps;Supine Gluteal Sets: AROM;Both;10 reps;Supine Short Arc Quad: AROM;Both;10 reps;Supine Heel Slides: AROM;Both;10 reps;Supine Hip ABduction/ADduction: AROM;Both;10 reps;Supine   PT Diagnosis:    PT Problem List:   PT Treatment Interventions:     PT Goals     Visit Information  Last PT Received On: 01/11/12    Subjective Data  Subjective: I've been getting up to go to the bathroom a lot Patient Stated Goal: recover from this illness   Prior Functioning  Home Living Lives With: Alone Available Help at Discharge: Family;Available 24 hours/day Type of Home: House Home Access: Level entry Home Layout: One level Home Adaptive Equipment: None Prior Function Level of Independence: Independent Able to Take Stairs?: Yes Driving: Yes Vocation: Retired Musician: No difficulties    Cognition  Overall Cognitive Status: Appears within functional limits for tasks assessed/performed Arousal/Alertness: Awake/alert Orientation Level: Appears intact for tasks assessed Behavior During Session: [facility] for tasks performed    Extremity/Trunk Assessment Right Lower Extremity Assessment RLE ROM/Strength/Tone: Within functional levels RLE Sensation: WFL - Light Touch RLE Coordination: WFL - gross motor Left Lower Extremity Assessment LLE ROM/Strength/Tone: Within functional levels LLE Sensation: WFL - Light Touch LLE Coordination: WFL - gross motor Trunk Assessment Trunk Assessment: Normal   Balance Balance Balance Assessed: No (WNL by observation)  End of Session PT - End of Session Equipment Utilized During Treatment: Gait belt Activity Tolerance: Patient tolerated treatment well Patient left: in chair;with call bell/phone within reach;with nursing in room Nurse Communication: Mobility status  GP     Konrad Penta 01/11/2012, 12:57 PM

## 2012-01-11 NOTE — H&P (Signed)
Yvette Branch is an 66 y.o. female.    PCP: Colette Ribas, MD  She was operated on by Dr. Gabriel Cirri in [facility] in [facility] about 10 days ago.  Chief Complaint: Abdominal pain, diarrhea  HPI: This is a 66 year old, Caucasian female, with a past with a history of osteoporosis, and osteoarthritis. She was admitted about the 2 weeks ago, at [facility] for small bowel obstruction. Adhesions were noted and patient underwent abdominal surgery. Patient was discharged subsequently. She went home. However, she did not feel any better. She still felt very weak. Had unsteady gait. Had dizziness at times. The abdominal pain persisted around the incision site. She's was having chills and then started having fevers in the last few days. She's been having diarrhea continuously since discharge. She reports 6-7 bowel movements on a daily basis. The stool is yellow in color. She notices some blood while wiping herself because she has hemorrhoids. The abdominal pain is at 8/10 in intensity. Is a sharp pain, which comes and goes. She's also noticed decreased amount of urination in the last few days. Denies any blood in the urine. She's had poor oral intake. She reports dry mouth. Overall she has been feeling quite poorly and so, she decided to come to the hospital.   Home Medications: Prior to Admission medications   Medication Sig Start Date End Date Taking? Authorizing Provider  ALPRAZolam (XANAX) 0.25 MG tablet Take 0.25 mg by mouth 4 (four) times daily as needed. For anxiety   Yes Historical Provider, MD  aspirin EC 81 MG tablet Take 81 mg by mouth daily.   Yes Historical Provider, MD  Estradiol Acetate (FEMRING) 0.05 MG/24HR RING Place 1 each vaginally every 3 (three) months.   Yes Historical Provider, MD  Glucos-MSM-C-Mn-Ginger-Willow (GLUCOSAMINE MSM COMPLEX PO) Take 1 tablet by mouth 2 (two) times daily.   Yes Historical Provider, MD  HYDROcodone-acetaminophen (VICODIN) 5-500  MG per tablet Take 1 tablet by mouth every 6 (six) hours as needed. For pain   Yes Historical Provider, MD  ibandronate (BONIVA) 150 MG tablet Take 150 mg by mouth every 30 (thirty) days. Take in the morning with a full glass of water, on an empty stomach, and do not take anything else by mouth or lie down for the next 30 min.   Yes Historical Provider, MD  lansoprazole (PREVACID) 30 MG capsule Take 30 mg by mouth 2 (two) times daily.   Yes Historical Provider, MD  Magnesium 400 MG CAPS Take 1 capsule by mouth every other day.   Yes Historical Provider, MD  Omega-3 Fatty Acids (FISH OIL) 1200 MG CAPS Take 1 capsule by mouth daily.   Yes Historical Provider, MD  pyridOXINE (VITAMIN B-6) 100 MG tablet Take 100 mg by mouth daily.   Yes Historical Provider, MD  UNABLE TO FIND Take 1 tablet by mouth 4 (four) times daily. Med Name: OSTEO-SHEATH (ENHANCED BONE SUPPORT-CALCIUM SUPPLEMENT) Includes: Calcium 1221mg  and Vitamin D3 1200IU   Yes Historical Provider, MD  vitamin A 8000 UNIT capsule Take 8,000 Units by mouth daily.   Yes Historical Provider, MD  vitamin C (ASCORBIC ACID) 500 MG tablet Take 500 mg by mouth daily.   Yes Historical Provider, MD  vitamin E 400 UNIT capsule Take 400 Units by mouth daily.   Yes Historical Provider, MD  vitamin k 100 MCG tablet Take 100 mcg by mouth daily.   Yes Historical Provider, MD  Vitamins-Lipotropics (BALANCED B-150 PO) Take 1 tablet by  mouth daily.   Yes Historical Provider, MD    Allergies:  Allergies  Allergen Reactions  . Hydrocodone Other (See Comments)    REACTION: causes pains in stomach    Past Medical History: Past Medical History  Diagnosis Date  . Arthritis   . Osteoporosis     Past Surgical History  Procedure Date  . Abdominal hysterectomy   . Abdominal surgery   . Bowel obstruction     Social History:  reports that she has never smoked. She does not have any smokeless tobacco history on file. She reports that she does not drink  alcohol or use illicit drugs.  Family History: There is a history of father throat cancer in her mother. Her sister has leukemia.  Review of Systems - History obtained from the patient General ROS: positive for  - fatigue and malaise Psychological ROS: negative Ophthalmic ROS: negative ENT ROS: negative Allergy and Immunology ROS: negative Hematological and Lymphatic ROS: negative Endocrine ROS: negative Respiratory ROS: no cough, shortness of breath, or wheezing Cardiovascular ROS: no chest pain or dyspnea on exertion Gastrointestinal ROS: as in hpi Genito-Urinary ROS: no dysuria, trouble voiding, or hematuria Musculoskeletal ROS: negative Neurological ROS: no TIA or stroke symptoms Dermatological ROS: negative  Physical Examination Blood pressure 126/69, pulse 86, temperature 101.5 F (38.6 C), temperature source Rectal, resp. rate 17, height 5\' 2"  (1.575 m), weight 45.36 kg (100 lb), SpO2 96.00%.  General appearance: alert, cooperative, appears stated age and no distress Head: Normocephalic, without obvious abnormality, atraumatic Eyes: conjunctivae/corneas clear. PERRL, EOM's intact. Throat: lips, mucosa, and tongue normal; teeth and gums normal Neck: no adenopathy, no carotid bruit, no JVD, supple, symmetrical, trachea midline and thyroid not enlarged, symmetric, no tenderness/mass/nodules Resp: clear to auscultation bilaterally Cardio: regular rate and rhythm, S1, S2 normal, no murmur, click, rub or gallop GI: Abdomen is soft. There is tenderness over the incision site. The incision site itself looks clean. There is no drainage noted. No rebound, rigidity, or guarding. Bowel sounds are sluggish. No masses, organomegaly. Extremities: Minimal pedal edema is noted bilaterally Pulses: 2+ and symmetric Skin: Skin color, texture, turgor normal. No rashes or lesions Lymph nodes: Cervical, supraclavicular, and axillary nodes normal. Neurologic: Grossly normal  Laboratory  Data: Results for orders placed during the hospital encounter of 01/10/12 (from the past 48 hour(s))  CBC WITH DIFFERENTIAL     Status: Abnormal   Collection Time   01/10/12  9:52 PM      Component Value Range Comment   WBC 16.6 (*) 4.0 - 10.5 K/uL    RBC 3.59 (*) 3.87 - 5.11 MIL/uL    Hemoglobin 10.4 (*) 12.0 - 15.0 g/dL    HCT 16.1 (*) 09.6 - 46.0 %    MCV 83.3  78.0 - 100.0 fL    MCH 29.0  26.0 - 34.0 pg    MCHC 34.8  30.0 - 36.0 g/dL    RDW 04.5  40.9 - 81.1 %    Platelets 565 (*) 150 - 400 K/uL    Neutrophils Relative 86 (*) 43 - 77 %    Neutro Abs 14.2 (*) 1.7 - 7.7 K/uL    Lymphocytes Relative 6 (*) 12 - 46 %    Lymphs Abs 1.0  0.7 - 4.0 K/uL    Monocytes Relative 8  3 - 12 %    Monocytes Absolute 1.3 (*) 0.1 - 1.0 K/uL    Eosinophils Relative 0  0 - 5 %    Eosinophils Absolute  0.0  0.0 - 0.7 K/uL    Basophils Relative 0  0 - 1 %    Basophils Absolute 0.0  0.0 - 0.1 K/uL   COMPREHENSIVE METABOLIC PANEL     Status: Abnormal   Collection Time   01/10/12  9:52 PM      Component Value Range Comment   Sodium 133 (*) 135 - 145 mEq/L    Potassium 2.0 (*) 3.5 - 5.1 mEq/L    Chloride 91 (*) 96 - 112 mEq/L    CO2 32  19 - 32 mEq/L    Glucose, Bld 98  70 - 99 mg/dL    BUN 7  6 - 23 mg/dL    Creatinine, Ser 4.09 (*) 0.50 - 1.10 mg/dL    Calcium 8.4  8.4 - 81.1 mg/dL    Total Protein 5.6 (*) 6.0 - 8.3 g/dL    Albumin 1.9 (*) 3.5 - 5.2 g/dL    AST 22  0 - 37 U/L    ALT 19  0 - 35 U/L    Alkaline Phosphatase 106  39 - 117 U/L    Total Bilirubin 0.3  0.3 - 1.2 mg/dL    GFR calc non Af Amer >90  >90 mL/min    GFR calc Af Amer >90  >90 mL/min   LIPASE, BLOOD     Status: Abnormal   Collection Time   01/10/12  9:52 PM      Component Value Range Comment   Lipase 64 (*) 11 - 59 U/L   URINALYSIS, ROUTINE W REFLEX MICROSCOPIC     Status: Abnormal   Collection Time   01/10/12 10:38 PM      Component Value Range Comment   Color, Urine YELLOW  YELLOW    APPearance CLEAR  CLEAR     Specific Gravity, Urine 1.010  1.005 - 1.030    pH 6.5  5.0 - 8.0    Glucose, UA NEGATIVE  NEGATIVE mg/dL    Hgb urine dipstick NEGATIVE  NEGATIVE    Bilirubin Urine NEGATIVE  NEGATIVE    Ketones, ur 15 (*) NEGATIVE mg/dL    Protein, ur NEGATIVE  NEGATIVE mg/dL    Urobilinogen, UA 0.2  0.0 - 1.0 mg/dL    Nitrite NEGATIVE  NEGATIVE    Leukocytes, UA NEGATIVE  NEGATIVE MICROSCOPIC NOT DONE ON URINES WITH NEGATIVE PROTEIN, BLOOD, LEUKOCYTES, NITRITE, OR GLUCOSE <1000 mg/dL.    Radiology Reports: Dg Chest 2 View  01/10/2012  *RADIOLOGY REPORT*  Clinical Data: Abdominal pain  CHEST - 2 VIEW  Comparison: 01/02/2012  Findings: Bilateral pleural effusions with associate consolidations, likely atelectasis.  Heart size and mediastinal contours within normal range.  No pneumothorax.  No acute osseous finding.  IMPRESSION: Small bilateral pleural effusions with associated consolidations, favor atelectasis.   Original Report Authenticated By: Waneta Martins, M.D.    Ct Abdomen Pelvis W Contrast  01/11/2012  *RADIOLOGY REPORT*  Clinical Data: Abdominal pain  CT ABDOMEN AND PELVIS WITH CONTRAST  Technique:  Multidetector CT imaging of the abdomen and pelvis was performed following the standard protocol during bolus administration of intravenous contrast.  Contrast: OMNIPAQUE IOHEXOL 300 MG/ML  SOLN  Comparison: 01/06/2012 radiograph, 12/22/2011 [facility]  Findings: Small bilateral pleural effusions.  Associated consolidations, likely atelectasis.  Normal heart size.  Trace pericardial fluid.  Unremarkable liver, spleen, pancreas, adrenal glands.  Symmetric renal enhancement.  Mild collecting system fullness bilaterally.  No obstructing intraluminal lesion identified.  Small amount of  water attenuation intraperitoneal fluid.  There is diffuse mesenteric edema and the distal colon is decompressed. There is circumferential thickening and pericolonic fat stranding. Small bowel loops are  diffusely thick walled and there are distended loops up to 3.5 cm.  Air and fluid noted within colon.  There is a 10.3 x 6.7 cm peripherally enhancing collection within the pelvis, in keeping with an abscess. Vaginal pessary again noted.  Sigmoid colonic diverticulosis.  The bladder is displaced anteriorly.  A smaller peripherally enhancing collection within the right paracolic gutter measures 1.6 x 2.8 x 3.4 cm.  Patent aorta and branch vessels.  No acute osseous finding.  IMPRESSION: Peripherally enhancing collection within the pelvis is concerning for abscess, measuring up to 10.3 x 6.7 cm.  Smaller peripherally enhancing collection within the right paracolic gutter measuring 1.6 x 2.8 cm.  Small bowel loops are distended and thick walled.  In keeping with a nonspecific enteritis (infectious, inflammatory, and ischemic considerations).  The colon is relatively decompressed distally with circumferential thickening.  Therefore, may reflect a nonspecific colitis.  Small bilateral pleural effusions with associated atelectasis. Small amount of free intraperitoneal fluid measuring water attenuation.  Discussed via telephone with Dr. Deretha Emory at 12:10 a.m. on 01/11/2012   Original Report Authenticated By: Waneta Martins, M.D.    ECG: Sinus rhythm at 100 beats per minute. Normal axis. Possible prolonged QT. No acute ST or T-wave changes are noted.  Assessment/Plan  Principal Problem:  *Intra-abdominal abscess post-procedure Active Problems:  Hypokalemia  Dehydration  Diarrhea  Malnutrition, calorie  Anemia   #1 intra-abdominal abscess: This is probably related to the surgical procedure that she had at [facility]. We will get records of the operative notes from there. General surgery has already been consulted and we await their input. We will place the patient on broad-spectrum antibiotics with vancomycin and Zosyn. Blood cultures will be obtained. Patient will likely require CT-guided  drainage of this abscess. Patient at this time appears to be hemodynamically stable. She'll be monitored in step down unit.  #2 acute diarrhea: We will get stool studies, including a C. difficile, PCR.  #3 dehydration with hyperkalemia: We will give her IV fluids. Repeat her potassium. Check magnesium levels.  #4 severe malnutrition: For now we will have to keep her n.p.o. But she will require nutritional supplementation when she is able to take orally. I don't anticipate her being n.p.o. for a prolonged duration of time.  #5 anemia: Continue to monitor. Check anemia panel.  #6 DVT, prophylaxis with SCDs for now. Once the procedure is done she can be switched over to heparin products.  She's a full code.  Physical and occupational therapy will be involved due to her deconditioned status.  Further management and decisions will depend on results of further testing and patient's response to treatment.   [facility]  [facility] Pager [phone]  01/11/2012, 1:46 AM

## 2012-01-11 NOTE — Consult Note (Signed)
Reason for Consult: Intra-abdominal abscess, status post exploratory laparotomy at another facility Referring Physician: [facility]  Yvette Branch is an 66 y.o. female.  HPI: Patient is a 66 year old white female who is approximately 10 days status post exploratory laparotomy by Dr. Marlene Bast at [facility] for a small bowel obstruction. Apparently, he did not resect any bowel. The patient was in the hospital for approximately 5-6 days postoperatively. She was discharged from [facility] last Thursday. She states she has not felt well since the surgery. She has had copious diarrhea. She is also had lower incisional and abdominal pain. She presented to [facility] where a CT scan of the abdomen revealed a large pelvic abscess. No significant free air was noted. There is no apparent bowel leak at this time.  Past Medical History  Diagnosis Date  . Arthritis   . Osteoporosis     Past Surgical History  Procedure Date  . Abdominal hysterectomy   . Abdominal surgery   . Bowel obstruction     History reviewed. No pertinent family history.  Social History:  reports that she has never smoked. She does not have any smokeless tobacco history on file. She reports that she does not drink alcohol or use illicit drugs.  Allergies:  Allergies  Allergen Reactions  . Hydrocodone Other (See Comments)    REACTION: causes pains in stomach    Medications: I have reviewed the patient's current medications.  Results for orders placed during the hospital encounter of 01/10/12 (from the past 48 hour(s))  CBC WITH DIFFERENTIAL     Status: Abnormal   Collection Time   01/10/12  9:52 PM      Component Value Range Comment   WBC 16.6 (*) 4.0 - 10.5 K/uL    RBC 3.59 (*) 3.87 - 5.11 MIL/uL    Hemoglobin 10.4 (*) 12.0 - 15.0 g/dL    HCT 16.1 (*) 09.6 - 46.0 %    MCV 83.3  78.0 - 100.0 fL    MCH 29.0  26.0 - 34.0 pg    MCHC 34.8  30.0 - 36.0 g/dL    RDW 04.5  40.9 - 81.1 %    Platelets 565 (*) 150 - 400 K/uL    Neutrophils Relative 86 (*) 43 - 77 %    Neutro Abs 14.2 (*) 1.7 - 7.7 K/uL    Lymphocytes Relative 6 (*) 12 - 46 %    Lymphs Abs 1.0  0.7 - 4.0 K/uL    Monocytes Relative 8  3 - 12 %    Monocytes Absolute 1.3 (*) 0.1 - 1.0 K/uL    Eosinophils Relative 0  0 - 5 %    Eosinophils Absolute 0.0  0.0 - 0.7 K/uL    Basophils Relative 0  0 - 1 %    Basophils Absolute 0.0  0.0 - 0.1 K/uL   COMPREHENSIVE METABOLIC PANEL     Status: Abnormal   Collection Time   01/10/12  9:52 PM      Component Value Range Comment   Sodium 133 (*) 135 - 145 mEq/L    Potassium 2.0 (*) 3.5 - 5.1 mEq/L    Chloride 91 (*) 96 - 112 mEq/L    CO2 32  19 - 32 mEq/L    Glucose, Bld 98  70 - 99 mg/dL    BUN 7  6 - 23 mg/dL    Creatinine, Ser 9.14 (*) 0.50 - 1.10 mg/dL    Calcium 8.4  8.4 -  10.5 mg/dL    Total Protein 5.6 (*) 6.0 - 8.3 g/dL    Albumin 1.9 (*) 3.5 - 5.2 g/dL    AST 22  0 - 37 U/L    ALT 19  0 - 35 U/L    Alkaline Phosphatase 106  39 - 117 U/L    Total Bilirubin 0.3  0.3 - 1.2 mg/dL    GFR calc non Af Amer >90  >90 mL/min    GFR calc Af Amer >90  >90 mL/min   LIPASE, BLOOD     Status: Abnormal   Collection Time   01/10/12  9:52 PM      Component Value Range Comment   Lipase 64 (*) 11 - 59 U/L   URINALYSIS, ROUTINE W REFLEX MICROSCOPIC     Status: Abnormal   Collection Time   01/10/12 10:38 PM      Component Value Range Comment   Color, Urine YELLOW  YELLOW    APPearance CLEAR  CLEAR    Specific Gravity, Urine 1.010  1.005 - 1.030    pH 6.5  5.0 - 8.0    Glucose, UA NEGATIVE  NEGATIVE mg/dL    Hgb urine dipstick NEGATIVE  NEGATIVE    Bilirubin Urine NEGATIVE  NEGATIVE    Ketones, ur 15 (*) NEGATIVE mg/dL    Protein, ur NEGATIVE  NEGATIVE mg/dL    Urobilinogen, UA 0.2  0.0 - 1.0 mg/dL    Nitrite NEGATIVE  NEGATIVE    Leukocytes, UA NEGATIVE  NEGATIVE MICROSCOPIC NOT DONE ON URINES WITH NEGATIVE PROTEIN, BLOOD, LEUKOCYTES, NITRITE, OR GLUCOSE <1000 mg/dL.    LACTIC ACID, PLASMA     Status: Normal   Collection Time   01/11/12  1:06 AM      Component Value Range Comment   Lactic Acid, Venous 0.8  0.5 - 2.2 mmol/L   PROCALCITONIN     Status: Normal   Collection Time   01/11/12  1:08 AM      Component Value Range Comment   Procalcitonin 4.22     CULTURE, BLOOD (ROUTINE X 2)     Status: Normal (Preliminary result)   Collection Time   01/11/12  1:11 AM      Component Value Range Comment   Specimen Description Blood RIGHT ARM      Special Requests BOTTLES DRAWN AEROBIC AND ANAEROBIC 6 CC EACH      Culture PENDING      Report Status PENDING     CULTURE, BLOOD (ROUTINE X 2)     Status: Normal (Preliminary result)   Collection Time   01/11/12  1:12 AM      Component Value Range Comment   Specimen Description Blood LEFT HAND      Special Requests BOTTLES DRAWN AEROBIC AND ANAEROBIC 6 CC EACH      Culture PENDING      Report Status PENDING     MAGNESIUM     Status: Normal   Collection Time   01/11/12  1:37 AM      Component Value Range Comment   Magnesium 1.5  1.5 - 2.5 mg/dL   RETICULOCYTES     Status: Abnormal   Collection Time   01/11/12  4:26 AM      Component Value Range Comment   Retic Ct Pct 1.8  0.4 - 3.1 %    RBC. 3.20 (*) 3.87 - 5.11 MIL/uL    Retic Count, Manual 57.6  19.0 - 186.0 K/uL   COMPREHENSIVE METABOLIC  PANEL     Status: Abnormal   Collection Time   01/11/12  4:26 AM      Component Value Range Comment   Sodium 134 (*) 135 - 145 mEq/L    Potassium 2.4 (*) 3.5 - 5.1 mEq/L    Chloride 94 (*) 96 - 112 mEq/L    CO2 29  19 - 32 mEq/L    Glucose, Bld 107 (*) 70 - 99 mg/dL    BUN 5 (*) 6 - 23 mg/dL    Creatinine, Ser 5.62 (*) 0.50 - 1.10 mg/dL    Calcium 7.6 (*) 8.4 - 10.5 mg/dL    Total Protein 5.1 (*) 6.0 - 8.3 g/dL    Albumin 1.7 (*) 3.5 - 5.2 g/dL    AST 21  0 - 37 U/L    ALT 16  0 - 35 U/L    Alkaline Phosphatase 113  39 - 117 U/L    Total Bilirubin 0.3  0.3 - 1.2 mg/dL    GFR calc non Af Amer >90  >90 mL/min     GFR calc Af Amer >90  >90 mL/min   CBC     Status: Abnormal   Collection Time   01/11/12  4:26 AM      Component Value Range Comment   WBC 16.8 (*) 4.0 - 10.5 K/uL    RBC 3.20 (*) 3.87 - 5.11 MIL/uL    Hemoglobin 9.3 (*) 12.0 - 15.0 g/dL    HCT 13.0 (*) 86.5 - 46.0 %    MCV 84.1  78.0 - 100.0 fL    MCH 29.1  26.0 - 34.0 pg    MCHC 34.6  30.0 - 36.0 g/dL    RDW 78.4  69.6 - 29.5 %    Platelets 566 (*) 150 - 400 K/uL     Dg Chest 2 View  01/10/2012  *RADIOLOGY REPORT*  Clinical Data: Abdominal pain  CHEST - 2 VIEW  Comparison: 01/02/2012  Findings: Bilateral pleural effusions with associate consolidations, likely atelectasis.  Heart size and mediastinal contours within normal range.  No pneumothorax.  No acute osseous finding.  IMPRESSION: Small bilateral pleural effusions with associated consolidations, favor atelectasis.   Original Report Authenticated By: Waneta Martins, M.D.    Ct Abdomen Pelvis W Contrast  01/11/2012  *RADIOLOGY REPORT*  Clinical Data: Abdominal pain  CT ABDOMEN AND PELVIS WITH CONTRAST  Technique:  Multidetector CT imaging of the abdomen and pelvis was performed following the standard protocol during bolus administration of intravenous contrast.  Contrast: OMNIPAQUE IOHEXOL 300 MG/ML  SOLN  Comparison: 01/06/2012 radiograph, 12/22/2011 CT[facility] possible  Findings: Small bilateral pleural effusions.  Associated consolidations, likely atelectasis.  Normal heart size.  Trace pericardial fluid.  Unremarkable liver, spleen, pancreas, adrenal glands.  Symmetric renal enhancement.  Mild collecting system fullness bilaterally.  No obstructing intraluminal lesion identified.  Small amount of water attenuation intraperitoneal fluid.  There is diffuse mesenteric edema and the distal colon is decompressed. There is circumferential thickening and pericolonic fat stranding. Small bowel loops are diffusely thick walled and there are distended loops up to 3.5 cm.  Air and  fluid noted within colon.  There is a 10.3 x 6.7 cm peripherally enhancing collection within the pelvis, in keeping with an abscess. Vaginal pessary again noted.  Sigmoid colonic diverticulosis.  The bladder is displaced anteriorly.  A smaller peripherally enhancing collection within the right paracolic gutter measures 1.6 x 2.8 x 3.4 cm.  Patent aorta and branch  vessels.  No acute osseous finding.  IMPRESSION: Peripherally enhancing collection within the pelvis is concerning for abscess, measuring up to 10.3 x 6.7 cm.  Smaller peripherally enhancing collection within the right paracolic gutter measuring 1.6 x 2.8 cm.  Small bowel loops are distended and thick walled.  In keeping with a nonspecific enteritis (infectious, inflammatory, and ischemic considerations).  The colon is relatively decompressed distally with circumferential thickening.  Therefore, may reflect a nonspecific colitis.  Small bilateral pleural effusions with associated atelectasis. Small amount of free intraperitoneal fluid measuring water attenuation.  Discussed via telephone with Dr. Deretha Emory at 12:10 a.m. on 01/11/2012   Original Report Authenticated By: Waneta Martins, M.D.     ROS: See chart Blood pressure 111/55, pulse 95, temperature 98.5 F (36.9 C), temperature source Oral, resp. rate 21, height 5\' 2"  (1.575 m), weight 53.8 kg (118 lb 9.7 oz), SpO2 94.00%. Physical Exam: Well-developed and well-nourished white female in no acute distress. Abdomen is slightly distended with active bowel sounds appreciated. No rigidity noted. Midline incision healing well.  Assessment/Plan: Impression: Intra-abdominal abscess, status post exploratory laparotomy at another facility. Plan: Agree with interventional radiologic drainage of the pelvic abscess. Agree with adding Flagyl to patient's antibiotics. No need for acute surgical intervention at this time. Will continue to follow with you.  Charlii Yost A 01/11/2012, 10:46 AM

## 2012-01-11 NOTE — Progress Notes (Signed)
Patient ID: Yvette Branch, female   DOB: 1946/02/02, 66 y.o.   MRN: [id number]   Request has been made per Dr Kerry Hough for abd abscess drain placement in Int Rad  Scheduled at [facility] Radiology 10/2 Pt to be at [facility] by 815 am via ambulance 10/2 Other orders in computer  I have spoken to RN pt will be consented at Radiology 10/2 prior to procedure

## 2012-01-11 NOTE — Progress Notes (Signed)
INITIAL ADULT NUTRITION ASSESSMENT Date: 01/11/2012   Time: 2:04 PM Reason for Assessment: Consult to assess nutritional status/requirements  ASSESSMENT: Female 66 y.o.  Dx: Intra-abdominal abscess post-procedure   Past Medical History  Diagnosis Date  . Arthritis   . Osteoporosis     Scheduled Meds:   . HYDROmorphone  1 mg Intravenous Once  . magnesium sulfate 1 - 4 g bolus IVPB  2 g Intravenous Once  . metronidazole  500 mg Intravenous Q8H  . ondansetron  4 mg Intravenous Once  . piperacillin-tazobactam (ZOSYN)  IV  3.375 g Intravenous Q8H  . potassium chloride  10 mEq Intravenous Once  . potassium chloride  10 mEq Intravenous Once  . potassium chloride  10 mEq Intravenous Q1 Hr x 4  . potassium chloride  10 mEq Intravenous Q1 Hr x 4  . sodium chloride  1,000 mL Intravenous Once  . sodium chloride  3 mL Intravenous Q12H  . vancomycin  750 mg Intravenous Q12H  . DISCONTD: sodium chloride   Intravenous STAT  . DISCONTD: piperacillin-tazobactam (ZOSYN)  IV  3.375 g Intravenous Once  . DISCONTD: vancomycin  1,000 mg Intravenous Once   Continuous Infusions:   . 0.9 % NaCl with KCl 20 mEq / L 100 mL/hr at 01/11/12 1400  . DISCONTD: sodium chloride 100 mL/hr at 01/10/12 2314   PRN Meds:.acetaminophen, acetaminophen, albuterol, ALPRAZolam, HYDROmorphone (DILAUDID) injection, iohexol, ondansetron (ZOFRAN) IV, ondansetron  Ht: 5\' 2"  (157.5 cm)  Wt: 118 lb 9.7 oz (53.8 kg)  Ideal Wt: 50.1 kg  % Ideal Wt: 108%  Usual Wt: 100-110# % Usual Wt: 108%  Wt Readings from Last 10 Encounters:  01/11/12 118 lb 9.7 oz (53.8 kg)    Body mass index is 21.69 kg/(m^2).Normal Range  Food/Nutrition Related Hx: Pt is s/p exploratory laparotomy at [facility]. She reports very limited po intake over past 3 weeks. Primarily liquids; tea, water, Boost 1x/day, peanut butter and bites of chicken. One soft brown stool reported today. She has significant wt gain 8% in 1 month and  inadequate protein-energy intake.  Pt meets criteria for severe malnutrition in the context of acute illness as evidenced by energy intake </= 50% for >/= 5 days and wasting of temporalis muscle.    CMP     Component Value Date/Time   NA 134* 01/11/2012 0426   K 2.4* 01/11/2012 0426   CL 94* 01/11/2012 0426   CO2 29 01/11/2012 0426   GLUCOSE 107* 01/11/2012 0426   BUN 5* 01/11/2012 0426   CREATININE 0.40* 01/11/2012 0426   CALCIUM 7.6* 01/11/2012 0426   PROT 5.1* 01/11/2012 0426   ALBUMIN 1.7* 01/11/2012 0426   AST 21 01/11/2012 0426   ALT 16 01/11/2012 0426   ALKPHOS 113 01/11/2012 0426   BILITOT 0.3 01/11/2012 0426   GFRNONAA >90 01/11/2012 0426   GFRAA >90 01/11/2012 0426    Intake/Output Summary (Last 24 hours) at 01/11/12 1406 Last data filed at 01/11/12 1400  Gross per 24 hour  Intake   1860 ml  Output   1450 ml  Net    410 ml    Diet Order: NPO  Supplements/Tube Feeding:none at this time  IVF:    0.9 % NaCl with KCl 20 mEq / L Last Rate: 100 mL/hr at 01/11/12 1400  DISCONTD: sodium chloride Last Rate: 100 mL/hr at 01/10/12 2314    Estimated Nutritional Needs:   Kcal:1650-1890 kcal Protein:80-90 gr Fluid:1 ml/kcal  NUTRITION DIAGNOSIS: -Inadequate oral intake (NI-2.1).  Status:  Ongoing  RELATED TO: recent hospitalization due to altered GI function   AS EVIDENCE BY: recent medical hx reflects bowel obstruction, NPO status, surgical procedure and pt reported poor oral intake past 3 weeks  MONITORING/EVALUATION(Goals): Monitor diet advancement and po intake, weight status Goal: Pt to meet >/= 90% of their estimated nutrition needs; not met  EDUCATION NEEDS: -Education not appropriate at this time  INTERVENTION: -RD to follow for nutrition plan.    Dietitian [phone]  DOCUMENTATION CODES Per approved criteria  -Severe malnutrition in the context of acute illness or injury    Francene Boyers 01/11/2012, 2:04 PM

## 2012-01-11 NOTE — ED Notes (Signed)
Patient to [facility]. Family at bedside.

## 2012-01-11 NOTE — Progress Notes (Signed)
Pt currently receiving runs of KCL. Critical K+ of 2.4 and previous K+ 2.0. Will continue to monitor.

## 2012-01-11 NOTE — ED Notes (Signed)
Patient back to bed. Patient placed on a bed side monitor. No needs voiced at this time.

## 2012-01-12 ENCOUNTER — Ambulatory Visit (HOSPITAL_COMMUNITY)
Admit: 2012-01-12 | Discharge: 2012-01-12 | Disposition: A | Discharge status: Home / Self Care | Payer: Medicare Other | Attending: Internal Medicine | Admitting: Internal Medicine

## 2012-01-12 ENCOUNTER — Encounter (HOSPITAL_COMMUNITY): Payer: Self-pay

## 2012-01-12 DIAGNOSIS — E46 Unspecified protein-calorie malnutrition: Secondary | ICD-10-CM

## 2012-01-12 LAB — CBC
MCV: 83.7 fL (ref 78.0–100.0)
Platelets: 599 10*3/uL — ABNORMAL HIGH (ref 150–400)
RBC: 3 MIL/uL — ABNORMAL LOW (ref 3.87–5.11)
RDW: 13.5 % (ref 11.5–15.5)
WBC: 18.9 10*3/uL — ABNORMAL HIGH (ref 4.0–10.5)

## 2012-01-12 LAB — BASIC METABOLIC PANEL
CO2: 24 mEq/L (ref 19–32)
Calcium: 7.2 mg/dL — ABNORMAL LOW (ref 8.4–10.5)
Creatinine, Ser: 0.41 mg/dL — ABNORMAL LOW (ref 0.50–1.10)
GFR calc Af Amer: >90 mL/min (ref >90)
GFR calc non Af Amer: >90 mL/min (ref >90)
Sodium: 134 mEq/L — ABNORMAL LOW (ref 135–145)

## 2012-01-12 LAB — URINE CULTURE

## 2012-01-12 LAB — VANCOMYCIN, TROUGH: Vancomycin Tr: 9.1 ug/mL — ABNORMAL LOW (ref 10.0–20.0)

## 2012-01-12 MED ORDER — SODIUM CHLORIDE 0.9 % IJ SOLN
INTRAMUSCULAR | Status: AC
  Filled 2012-01-12: qty 3

## 2012-01-12 MED ORDER — MIDAZOLAM HCL 5 MG/5ML IJ SOLN
INTRAMUSCULAR | Status: AC | PRN
  Administered 2012-01-12 (×3): 1 mg via INTRAVENOUS

## 2012-01-12 MED ORDER — FENTANYL CITRATE 0.05 MG/ML IJ SOLN
INTRAMUSCULAR | Status: AC | PRN
  Administered 2012-01-12: 50 ug via INTRAVENOUS
  Administered 2012-01-12: 100 ug via INTRAVENOUS
  Administered 2012-01-12: 50 ug via INTRAVENOUS

## 2012-01-12 MED ORDER — VANCOMYCIN HCL 1000 MG IV SOLR
1250.0000 mg | Freq: Two times a day (BID) | INTRAVENOUS | Status: DC
  Administered 2012-01-12 – 2012-01-17 (×10): 1250 mg via INTRAVENOUS
  Filled 2012-01-12 (×14): qty 1250

## 2012-01-12 NOTE — H&P (Signed)
Agree with PA note.  Proceed with CT guided drain placement.  Signed,  Sterling Big, MD Vascular & Interventional Radiologist [facility] Radiology

## 2012-01-12 NOTE — Progress Notes (Signed)
ANTIBIOTIC CONSULT NOTE - INITIAL  Pharmacy Consult for Zosyn & vancomycin Indication:   Intra-abdominal abcess  Allergies  Allergen Reactions  . Hydrocodone Other (See Comments)    REACTION: causes pains in stomach    Patient Measurements: Height: 5\' 2"  (157.5 cm) Weight: 125 lb 7.1 oz (56.9 kg) IBW/kg (Calculated) : 50.1    Vital Signs: Temp: 98.2 F (36.8 C) (10/02 0727) Temp src: Oral (10/02 0727) BP: 99/57 mmHg (10/02 1148) Pulse Rate: 95  (10/02 1148) Intake/Output from previous day: 10/01 0701 - 10/02 0700 In: 3380 [P.O.:180; I.V.:2300; IV Piggyback:900] Out: 1200 [Urine:1200] Intake/Output from this shift:    Labs:  Basename 01/12/12 0426 01/11/12 0426 01/10/12 2152  WBC 18.9* 16.8* 16.6*  HGB 8.6* 9.3* 10.4*  PLT 599* 566* 565*  LABCREA -- -- --  CREATININE 0.41* 0.40* 0.40*   Estimated Creatinine Clearance: 55.4 ml/min (by C-G formula based on Cr of 0.41).   Microbiology: Recent Results (from the past 720 hour(s))  CULTURE, BLOOD (ROUTINE X 2)     Status: Normal (Preliminary result)   Collection Time   01/11/12  1:11 AM      Component Value Range Status Comment   Specimen Description BLOOD RIGHT ARM   Final    Special Requests BOTTLES DRAWN AEROBIC AND ANAEROBIC 6CC   Final    Culture NO GROWTH 1 DAY   Final    Report Status PENDING   Incomplete   CULTURE, BLOOD (ROUTINE X 2)     Status: Normal (Preliminary result)   Collection Time   01/11/12  1:12 AM      Component Value Range Status Comment   Specimen Description BLOOD LEFT HAND   Final    Special Requests BOTTLES DRAWN AEROBIC AND ANAEROBIC 6CC   Final    Culture NO GROWTH 1 DAY   Final    Report Status PENDING   Incomplete   MRSA PCR SCREENING     Status: Normal   Collection Time   01/11/12  3:33 AM      Component Value Range Status Comment   MRSA by PCR NEGATIVE  NEGATIVE Final     Medical History: Past Medical History  Diagnosis Date  . Arthritis   . Osteoporosis     Medications:   Scheduled:     . metronidazole  500 mg Intravenous Q8H  . piperacillin-tazobactam (ZOSYN)  IV  3.375 g Intravenous Q8H  . potassium chloride  10 mEq Intravenous Q1 Hr x 4  . sodium chloride  3 mL Intravenous Q12H  . sodium chloride      . vancomycin  1,250 mg Intravenous Q12H  . DISCONTD: vancomycin  750 mg Intravenous Q12H   Infusions:     . 0.9 % NaCl with KCl 20 mEq / L 100 mL/hr at 01/12/12 0800   PRN: acetaminophen, acetaminophen, albuterol, ALPRAZolam, HYDROmorphone (DILAUDID) injection, ondansetron (ZOFRAN) IV, ondansetron  Assessment: Patient with intra-abdominal abscess on day#2 empiric, broad-spectrum antibiotics with Vancomycin & Zosyn. Excellent renal function. Trough level below desired goal range.   Goal of Therapy:  Vancomycin trough 15-38mcg/ml  Plan:  1)  Increase Vancomycin 1250mg  IV Q12h 2) Continue Zosyn 3) Weekly trough level & Scr while on Vancomycin 4) Monitor renal function and cx data   Tavonna Worthington, [facility] 01/12/2012,2:57 PM

## 2012-01-12 NOTE — Clinical Documentation Improvement (Signed)
MALNUTRITION DOCUMENTATION CLARIFICATION  THIS DOCUMENT IS NOT A PERMANENT PART OF THE MEDICAL RECORD  TO RESPOND TO THE THIS QUERY, FOLLOW THE INSTRUCTIONS BELOW:  1. If needed, update documentation for the patient's encounter via the notes activity.  2. Access this query again and click edit on the In [employer].  3. After updating, or not, click F2 to complete all highlighted (required) fields concerning your review. Select "additional documentation in the medical record" OR "no additional documentation provided".  4. Click Sign note button.  5. The deficiency will fall out of your In Basket *Please let us know if you are not able to complete this workflow by phone or e-mail (listed below).  Please update your documentation within the medical record to reflect your response to this query.                                                                                        01/12/12   Dear Dr. Kerry Hough / Associates,  In a better effort to capture your patient's severity of illness, reflect appropriate length of stay and utilization of resources, a review of the patient medical record has revealed the following indicators.  Based on your clinical judgment, please clarify and document in a progress note and/or discharge summary the clinical condition associated with the following supporting information. In responding to this query please exercise your independent judgment.  The fact that a query is asked, does not imply that any particular answer is desired or expected.  Please clarify "calorie malnutrition"  Possible Clinical Conditions?  Mild Malnutrition  Moderate Malnutrition Severe Malnutrition   Protein Calorie Malnutrition Severe Protein Calorie Malnutrition Other Condition________________ Cannot clinically determine     Clinical Information:   Risk Factors: Diagnosised w/"calorie malnutrition" Per RD: "Pt meets criteria for severe malnutrition in the context of acute  illness as evidenced by energy intake </= for 50%/= 5 days and wasting of temporalis muscle".  S/P abdominal surgery for bowel obstruction recently Continuous diarrhea since discharge NPO  Signs & Symptoms: -Ht: 5'2" Wt: 118 lbs -BMI: 21.69  -Diagnostics: -Albumin level: 1.7 -Total Protein: 5.1 -Calcium level: 7.6  Treatments: Nutrition Consult:NUTRITION DIAGNOSIS: Inadequate oral intake related to recent hospitalization due to altered GI function as evidenced by recent medical hx reflects bowel obstruction, NPO status, surgical procedure and pt reported poor oral intake past 3 weeks MONITORING/EVALUATION(Goals): Monitor diet advancement and po intake, weight status Goal: Pt to meet >/= 90% of their estimated nutrition needs; not met INTERVENTION: RD to follow for nutrition plan.  You may use possible, probable, or suspect with inpatient documentation. possible, probable, suspected diagnoses MUST be documented at the time of discharge  Reviewed: additional documentation in the medical record by Dr. Kerry Hough "severe protien caloriey malnutrition = [facility]  Thank You,  Harless Litten  RN, MSN Clinical Documentation Specialist: Office# [phone] [facility] [place]

## 2012-01-12 NOTE — Progress Notes (Signed)
UR Chart Review Completed  

## 2012-01-12 NOTE — Procedures (Signed)
Interventional Radiology Procedure Note  Procedure: Placement of 25F perc drain under CT guidance.  240 mL purulent, foul smelling fluid aspirated.  Sent for Gram stain and Cx Complications: None Recommendations: - Maintain to bulb suction - Record output.  When output is < 10-15 mL per day x 2-3 days, and pt is clinically feeling better repeat pelvic CT.  If no undrained fluid, tube can be cut and removed.  - Follow cultures, adjust Abx as indicated  Signed,  Sterling Big, MD Vascular & Interventional Radiologist [facility] Radiology

## 2012-01-12 NOTE — H&P (Signed)
Yvette Branch is an 66 y.o. female.   Chief Complaint: Small bowel obstruction; surgery 12 days ago at [facility] Did well for few days then developed abd pain and fever CT shows Abdominal/pelvic abscess Now for abscess drain placement  HPI: arthritis  Past Medical History  Diagnosis Date  . Arthritis   . Osteoporosis     Past Surgical History  Procedure Date  . Abdominal hysterectomy   . Abdominal surgery   . Bowel obstruction     No family history on file. Social History:  reports that she has never smoked. She does not have any smokeless tobacco history on file. She reports that she does not drink alcohol or use illicit drugs.  Allergies:  Allergies  Allergen Reactions  . Hydrocodone Other (See Comments)    REACTION: causes pains in stomach     (Not in a hospital admission)  Results for orders placed during the hospital encounter of 01/10/12 (from the past 48 hour(s))  CBC WITH DIFFERENTIAL     Status: Abnormal   Collection Time   01/10/12  9:52 PM      Component Value Range Comment   WBC 16.6 (*) 4.0 - 10.5 K/uL    RBC 3.59 (*) 3.87 - 5.11 MIL/uL    Hemoglobin 10.4 (*) 12.0 - 15.0 g/dL    HCT 40.9 (*) 81.1 - 46.0 %    MCV 83.3  78.0 - 100.0 fL    MCH 29.0  26.0 - 34.0 pg    MCHC 34.8  30.0 - 36.0 g/dL    RDW 91.4  78.2 - 95.6 %    Platelets 565 (*) 150 - 400 K/uL    Neutrophils Relative 86 (*) 43 - 77 %    Neutro Abs 14.2 (*) 1.7 - 7.7 K/uL    Lymphocytes Relative 6 (*) 12 - 46 %    Lymphs Abs 1.0  0.7 - 4.0 K/uL    Monocytes Relative 8  3 - 12 %    Monocytes Absolute 1.3 (*) 0.1 - 1.0 K/uL    Eosinophils Relative 0  0 - 5 %    Eosinophils Absolute 0.0  0.0 - 0.7 K/uL    Basophils Relative 0  0 - 1 %    Basophils Absolute 0.0  0.0 - 0.1 K/uL   COMPREHENSIVE METABOLIC PANEL     Status: Abnormal   Collection Time   01/10/12  9:52 PM      Component Value Range Comment   Sodium 133 (*) 135 - 145 mEq/L    Potassium 2.0 (*) 3.5 - 5.1 mEq/L    Chloride 91  (*) 96 - 112 mEq/L    CO2 32  19 - 32 mEq/L    Glucose, Bld 98  70 - 99 mg/dL    BUN 7  6 - 23 mg/dL    Creatinine, Ser 2.13 (*) 0.50 - 1.10 mg/dL    Calcium 8.4  8.4 - 08.6 mg/dL    Total Protein 5.6 (*) 6.0 - 8.3 g/dL    Albumin 1.9 (*) 3.5 - 5.2 g/dL    AST 22  0 - 37 U/L    ALT 19  0 - 35 U/L    Alkaline Phosphatase 106  39 - 117 U/L    Total Bilirubin 0.3  0.3 - 1.2 mg/dL    GFR calc non Af Amer >90  >90 mL/min    GFR calc Af Amer >90  >90 mL/min   LIPASE, BLOOD  Status: Abnormal   Collection Time   01/10/12  9:52 PM      Component Value Range Comment   Lipase 64 (*) 11 - 59 U/L   URINALYSIS, ROUTINE W REFLEX MICROSCOPIC     Status: Abnormal   Collection Time   01/10/12 10:38 PM      Component Value Range Comment   Color, Urine YELLOW  YELLOW    APPearance CLEAR  CLEAR    Specific Gravity, Urine 1.010  1.005 - 1.030    pH 6.5  5.0 - 8.0    Glucose, UA NEGATIVE  NEGATIVE mg/dL    Hgb urine dipstick NEGATIVE  NEGATIVE    Bilirubin Urine NEGATIVE  NEGATIVE    Ketones, ur 15 (*) NEGATIVE mg/dL    Protein, ur NEGATIVE  NEGATIVE mg/dL    Urobilinogen, UA 0.2  0.0 - 1.0 mg/dL    Nitrite NEGATIVE  NEGATIVE    Leukocytes, UA NEGATIVE  NEGATIVE MICROSCOPIC NOT DONE ON URINES WITH NEGATIVE PROTEIN, BLOOD, LEUKOCYTES, NITRITE, OR GLUCOSE <1000 mg/dL.  LACTIC ACID, PLASMA     Status: Normal   Collection Time   01/11/12  1:06 AM      Component Value Range Comment   Lactic Acid, Venous 0.8  0.5 - 2.2 mmol/L   PROCALCITONIN     Status: Normal   Collection Time   01/11/12  1:08 AM      Component Value Range Comment   Procalcitonin 4.22     CULTURE, BLOOD (ROUTINE X 2)     Status: Normal (Preliminary result)   Collection Time   01/11/12  1:11 AM      Component Value Range Comment   Specimen Description Blood RIGHT ARM      Special Requests BOTTLES DRAWN AEROBIC AND ANAEROBIC 6 CC EACH      Culture NO GROWTH <24 HRS      Report Status PENDING     CULTURE, BLOOD (ROUTINE X 2)      Status: Normal (Preliminary result)   Collection Time   01/11/12  1:12 AM      Component Value Range Comment   Specimen Description Blood LEFT HAND      Special Requests BOTTLES DRAWN AEROBIC AND ANAEROBIC 6 CC EACH      Culture NO GROWTH <24 HRS      Report Status PENDING     MAGNESIUM     Status: Normal   Collection Time   01/11/12  1:37 AM      Component Value Range Comment   Magnesium 1.5  1.5 - 2.5 mg/dL   MRSA PCR SCREENING     Status: Normal   Collection Time   01/11/12  3:33 AM      Component Value Range Comment   MRSA by PCR NEGATIVE  NEGATIVE   VITAMIN B12     Status: Abnormal   Collection Time   01/11/12  4:26 AM      Component Value Range Comment   Vitamin B-12 1534 (*) 211 - 911 pg/mL   FOLATE     Status: Normal   Collection Time   01/11/12  4:26 AM      Component Value Range Comment   Folate 11.6     IRON AND TIBC     Status: Abnormal   Collection Time   01/11/12  4:26 AM      Component Value Range Comment   Iron <10 (*) 42 - 135 ug/dL    TIBC Not calculated  due to Iron <10.  250 - 470 ug/dL    Saturation Ratios Not calculated due to Iron <10.  20 - 55 %    UIBC 93 (*) 125 - 400 ug/dL   FERRITIN     Status: Abnormal   Collection Time   01/11/12  4:26 AM      Component Value Range Comment   Ferritin 337 (*) 10 - 291 ng/mL   RETICULOCYTES     Status: Abnormal   Collection Time   01/11/12  4:26 AM      Component Value Range Comment   Retic Ct Pct 1.8  0.4 - 3.1 %    RBC. 3.20 (*) 3.87 - 5.11 MIL/uL    Retic Count, Manual 57.6  19.0 - 186.0 K/uL   COMPREHENSIVE METABOLIC PANEL     Status: Abnormal   Collection Time   01/11/12  4:26 AM      Component Value Range Comment   Sodium 134 (*) 135 - 145 mEq/L    Potassium 2.4 (*) 3.5 - 5.1 mEq/L    Chloride 94 (*) 96 - 112 mEq/L    CO2 29  19 - 32 mEq/L    Glucose, Bld 107 (*) 70 - 99 mg/dL    BUN 5 (*) 6 - 23 mg/dL    Creatinine, Ser 1.61 (*) 0.50 - 1.10 mg/dL    Calcium 7.6 (*) 8.4 - 10.5 mg/dL    Total Protein  5.1 (*) 6.0 - 8.3 g/dL    Albumin 1.7 (*) 3.5 - 5.2 g/dL    AST 21  0 - 37 U/L    ALT 16  0 - 35 U/L    Alkaline Phosphatase 113  39 - 117 U/L    Total Bilirubin 0.3  0.3 - 1.2 mg/dL    GFR calc non Af Amer >90  >90 mL/min    GFR calc Af Amer >90  >90 mL/min   CBC     Status: Abnormal   Collection Time   01/11/12  4:26 AM      Component Value Range Comment   WBC 16.8 (*) 4.0 - 10.5 K/uL    RBC 3.20 (*) 3.87 - 5.11 MIL/uL    Hemoglobin 9.3 (*) 12.0 - 15.0 g/dL    HCT 09.6 (*) 04.5 - 46.0 %    MCV 84.1  78.0 - 100.0 fL    MCH 29.1  26.0 - 34.0 pg    MCHC 34.6  30.0 - 36.0 g/dL    RDW 40.9  81.1 - 91.4 %    Platelets 566 (*) 150 - 400 K/uL   PROTIME-INR     Status: Normal   Collection Time   01/11/12  2:00 PM      Component Value Range Comment   Prothrombin Time 13.9  11.6 - 15.2 seconds    INR 1.08  0.00 - 1.49   APTT     Status: Normal   Collection Time   01/11/12  2:00 PM      Component Value Range Comment   aPTT 33  24 - 37 seconds   BASIC METABOLIC PANEL     Status: Abnormal   Collection Time   01/12/12  4:26 AM      Component Value Range Comment   Sodium 134 (*) 135 - 145 mEq/L    Potassium 3.1 (*) 3.5 - 5.1 mEq/L DELTA CHECK NOTED   Chloride 98  96 - 112 mEq/L    CO2 24  19 -  32 mEq/L    Glucose, Bld 59 (*) 70 - 99 mg/dL    BUN 5 (*) 6 - 23 mg/dL    Creatinine, Ser 0.86 (*) 0.50 - 1.10 mg/dL    Calcium 7.2 (*) 8.4 - 10.5 mg/dL    GFR calc non Af Amer >90  >90 mL/min    GFR calc Af Amer >90  >90 mL/min   CBC     Status: Abnormal   Collection Time   01/12/12  4:26 AM      Component Value Range Comment   WBC 18.9 (*) 4.0 - 10.5 K/uL    RBC 3.00 (*) 3.87 - 5.11 MIL/uL    Hemoglobin 8.6 (*) 12.0 - 15.0 g/dL    HCT 57.8 (*) 46.9 - 46.0 %    MCV 83.7  78.0 - 100.0 fL    MCH 28.7  26.0 - 34.0 pg    MCHC 34.3  30.0 - 36.0 g/dL    RDW 62.9  52.8 - 41.3 %    Platelets 599 (*) 150 - 400 K/uL    Dg Chest 2 View  01/10/2012  *RADIOLOGY REPORT*  Clinical Data: Abdominal  pain  CHEST - 2 VIEW  Comparison: 01/02/2012  Findings: Bilateral pleural effusions with associate consolidations, likely atelectasis.  Heart size and mediastinal contours within normal range.  No pneumothorax.  No acute osseous finding.  IMPRESSION: Small bilateral pleural effusions with associated consolidations, favor atelectasis.   Original Report Authenticated By: Waneta Martins, M.D.    Ct Abdomen Pelvis W Contrast  01/11/2012  *RADIOLOGY REPORT*  Clinical Data: Abdominal pain  CT ABDOMEN AND PELVIS WITH CONTRAST  Technique:  Multidetector CT imaging of the abdomen and pelvis was performed following the standard protocol during bolus administration of intravenous contrast.  Contrast: OMNIPAQUE IOHEXOL 300 MG/ML  SOLN  Comparison: 01/06/2012 radiograph, 12/22/2011 CT[facility] [place] possible  Findings: Small bilateral pleural effusions.  Associated consolidations, likely atelectasis.  Normal heart size.  Trace pericardial fluid.  Unremarkable liver, spleen, pancreas, adrenal glands.  Symmetric renal enhancement.  Mild collecting system fullness bilaterally.  No obstructing intraluminal lesion identified.  Small amount of water attenuation intraperitoneal fluid.  There is diffuse mesenteric edema and the distal colon is decompressed. There is circumferential thickening and pericolonic fat stranding. Small bowel loops are diffusely thick walled and there are distended loops up to 3.5 cm.  Air and fluid noted within colon.  There is a 10.3 x 6.7 cm peripherally enhancing collection within the pelvis, in keeping with an abscess. Vaginal pessary again noted.  Sigmoid colonic diverticulosis.  The bladder is displaced anteriorly.  A smaller peripherally enhancing collection within the right paracolic gutter measures 1.6 x 2.8 x 3.4 cm.  Patent aorta and branch vessels.  No acute osseous finding.  IMPRESSION: Peripherally enhancing collection within the pelvis is concerning for abscess, measuring up to 10.3 x  6.7 cm.  Smaller peripherally enhancing collection within the right paracolic gutter measuring 1.6 x 2.8 cm.  Small bowel loops are distended and thick walled.  In keeping with a nonspecific enteritis (infectious, inflammatory, and ischemic considerations).  The colon is relatively decompressed distally with circumferential thickening.  Therefore, may reflect a nonspecific colitis.  Small bilateral pleural effusions with associated atelectasis. Small amount of free intraperitoneal fluid measuring water attenuation.  Discussed via telephone with Dr. Deretha Emory at 12:10 a.m. on 01/11/2012   Original Report Authenticated By: Waneta Martins, M.D.     Review of Systems  Constitutional: Positive  for weight loss. Negative for fever.  Cardiovascular: Negative for chest pain.  Gastrointestinal: Positive for nausea and abdominal pain.  Neurological: Positive for weakness.    There were no vitals taken for this visit. Physical Exam  Constitutional: She appears well-developed and well-nourished.  Cardiovascular: Normal rate and regular rhythm.   Murmur heard. Respiratory: Effort normal. She has no wheezes.  GI: Soft. Bowel sounds are normal. There is tenderness.  Musculoskeletal: Normal range of motion.  Neurological: She is alert.  Psychiatric: She has a normal mood and affect. Her behavior is normal. Judgment and thought content normal.     Assessment/Plan SBO surgery 12 days ago Now with abscess Scheduled for drain placement Pt aware of procedure benefits and risks and agreeable to proceed Consent signed and in chart  Williams Dietrick A 01/12/2012, 9:29 AM

## 2012-01-12 NOTE — Progress Notes (Signed)
[facility] PROGRESS NOTE  Yvette Branch WJX:[id number] DOB: 08-04-1945 DOA: 01/10/2012 PCP: Colette Ribas, MD  Assessment/Plan:  Principal Problem:  *Intra-abdominal abscess post-procedure Active Problems:  Hypokalemia  Dehydration  Diarrhea  Malnutrition, calorie  Anemia   1. Intra-abdominal abscess. Patient underwent placement of percutaneous drain. She is on broad-spectrum antibiotics. Surgery is following. 2. Hypokalemia. Improved with replacement 3. Diarrhea. C. difficile pending. She is on Flagyl. 4. Anemia. Likely due to acute illness. Will continue to monitor and transfuse as needed 5. Leukocytosis, likely due to abscess, continue to follow 6. Severe protein calorie malnutrition.  Nutrition following, start clear liquids and advance as tolerated  Code Status: full code Family Communication: discussed with patient at bedside Disposition Plan: pending further hospital course, if remains stable then transfer to floor in am   Consultants:  General surgery, Dr. Lovell Sheehan  Interventional radiology  Procedures:  Percutaneous drain placement for intra abdominal abscess  Antibiotics:  Vancomycin 9/30  Zosyn 9/30  Flagyl 10/1  HPI/Subjective: Feeling better since drain has been placed. Abdomen is still sore, no nausea or vomiting.  Diarrhea appears to be improving.  Objective: Filed Vitals:   01/12/12 0600 01/12/12 0700 01/12/12 0727 01/12/12 0800  BP: 112/54 92/77 115/64 116/61  Pulse: 93 106 92 97  Temp:   98.2 F (36.8 C)   TempSrc:   Oral   Resp: 17 16 21 17   Height:      Weight:      SpO2: 95% 96% 96% 93%    Intake/Output Summary (Last 24 hours) at 01/12/12 1633 Last data filed at 01/12/12 0700  Gross per 24 hour  Intake   1910 ml  Output    800 ml  Net   1110 ml   Filed Weights   01/10/12 1850 01/11/12 0330 01/12/12 0500  Weight: 45.36 kg (100 lb) 53.8 kg (118 lb 9.7 oz) 56.9 kg (125 lb 7.1 oz)    Exam:   General:   NAD  Cardiovascular: s1, s2, rrr  Respiratory: cta b  Abdomen: soft, nt, bs+, drain placed on left side posteriorly  Data Reviewed: Basic Metabolic Panel:  Lab 01/12/12 2130 01/11/12 0426 01/11/12 0137 01/10/12 2152  NA 134* 134* -- 133*  K 3.1* 2.4* -- 2.0*  CL 98 94* -- 91*  CO2 24 29 -- 32  GLUCOSE 59* 107* -- 98  BUN 5* 5* -- 7  CREATININE 0.41* 0.40* -- 0.40*  CALCIUM 7.2* 7.6* -- 8.4  MG -- -- 1.5 --  PHOS -- -- -- --   Liver Function Tests:  Lab 01/11/12 0426 01/10/12 2152  AST 21 22  ALT 16 19  ALKPHOS 113 106  BILITOT 0.3 0.3  PROT 5.1* 5.6*  ALBUMIN 1.7* 1.9*    Lab 01/10/12 2152  LIPASE 64*  AMYLASE --   No results found for this basename: AMMONIA:5 in the last 168 hours CBC:  Lab 01/12/12 0426 01/11/12 0426 01/10/12 2152  WBC 18.9* 16.8* 16.6*  NEUTROABS -- -- 14.2*  HGB 8.6* 9.3* 10.4*  HCT 25.1* 26.9* 29.9*  MCV 83.7 84.1 83.3  PLT 599* 566* 565*   Cardiac Enzymes: No results found for this basename: CKTOTAL:5,CKMB:5,CKMBINDEX:5,TROPONINI:5 in the last 168 hours BNP (last 3 results) No results found for this basename: PROBNP:3 in the last 8760 hours CBG: No results found for this basename: GLUCAP:5 in the last 168 hours  Recent Results (from the past 240 hour(s))  CULTURE, BLOOD (ROUTINE X 2)     Status: Normal (  Preliminary result)   Collection Time   01/11/12  1:11 AM      Component Value Range Status Comment   Specimen Description BLOOD RIGHT ARM   Final    Special Requests BOTTLES DRAWN AEROBIC AND ANAEROBIC 6CC   Final    Culture NO GROWTH 1 DAY   Final    Report Status PENDING   Incomplete   CULTURE, BLOOD (ROUTINE X 2)     Status: Normal (Preliminary result)   Collection Time   01/11/12  1:12 AM      Component Value Range Status Comment   Specimen Description BLOOD LEFT HAND   Final    Special Requests BOTTLES DRAWN AEROBIC AND ANAEROBIC 6CC   Final    Culture NO GROWTH 1 DAY   Final    Report Status PENDING   Incomplete    MRSA PCR SCREENING     Status: Normal   Collection Time   01/11/12  3:33 AM      Component Value Range Status Comment   MRSA by PCR NEGATIVE  NEGATIVE Final      Studies: Dg Chest 2 View  01/10/2012  *RADIOLOGY REPORT*  Clinical Data: Abdominal pain  CHEST - 2 VIEW  Comparison: 01/02/2012  Findings: Bilateral pleural effusions with associate consolidations, likely atelectasis.  Heart size and mediastinal contours within normal range.  No pneumothorax.  No acute osseous finding.  IMPRESSION: Small bilateral pleural effusions with associated consolidations, favor atelectasis.   Original Report Authenticated By: Waneta Martins, M.D.    Ct Guided Abscess Drain  01/12/2012  *RADIOLOGY REPORT*  CT GUIDED ABSCESS DRAIN  Date: 01/12/2012  Clinical History: 66 year old female with loculated fluid collection concerning for abscess in the rectouterine recess of [place].  She is 12 days postop laparotomy for small bowel obstruction.  Procedures Performed: 1. CT guided placement of a 12 French peritoneal abscess drain  Interventional Radiologist:  Sterling Big, MD  Sedation: Moderate (conscious) sedation was used.  3 mg Versed, 200 mcg Fentanyl were administered intravenously.  The patient's vital signs were monitored continuously by radiology nursing throughout the procedure.  Sedation Time: 25 minutes  PROCEDURE/FINDINGS:   Informed consent was obtained from the patient following explanation of the procedure, risks, benefits and alternatives. The patient understands, agrees and consents for the procedure. All questions were addressed. A time out was performed.  Maximal barrier sterile technique utilized including caps, mask, sterile gowns, sterile gloves, large sterile drape, hand hygiene, and betadine skin prep.  A planning axial CT scan was performed.  The loculated fluid collection in the rectouterine recess of [place] was identified. An appropriate skin entry site was selected and marked.  Local  anesthesia was achieved with infiltration of 1% lidocaine.  Under CT fluoroscopic guidance, an 18 gauge trocar needle was advanced through the skin, and soft tissues and into the fluid collection. Care was taken to pass the needle through the sacrospinous ligament and medial to the gluteal vessels.  A wire was then advanced into the pelvic fluid collection.  The skin track was then serially dilated to 12-French, and ultimately a 12-French Cook multipurpose drainage catheter was advanced over a wire and positioned within the fluid collection.  A total of 230 ml of frankly purulent, foul- smelling material was then successfully aspirated.  A sample was sent to microbiology for Gram stain and culture.  The catheter was then secured to the skin with 0- Prolene suture and a soft plastic bumper. An adhesive retention device  was also used. The catheter was connected to a bulb suction device.  The patient tolerated the procedure very well, there was no immediate complication.  IMPRESSION:  Successful placement of a 12 French drainage catheter in the deep pelvic abscess.  A total of 240 ml of frankly purulent, foul- smelling fluid was successfully aspirated.  A sample was sent for Gram stain and culture.  Signed,  Sterling Big, MD Vascular & Interventional Radiologist [facility]   Original Report Authenticated By: Threasa Beards Abdomen Pelvis W Contrast  01/11/2012  *RADIOLOGY REPORT*  Clinical Data: Abdominal pain  CT ABDOMEN AND PELVIS WITH CONTRAST  Technique:  Multidetector CT imaging of the abdomen and pelvis was performed following the standard protocol during bolus administration of intravenous contrast.  Contrast: OMNIPAQUE IOHEXOL 300 MG/ML  SOLN  Comparison: 01/06/2012 radiograph, 12/22/2011 CT[facility] [place] [facility]  Findings: Small bilateral pleural effusions.  Associated consolidations, likely atelectasis.  Normal heart size.  Trace pericardial fluid.  Unremarkable liver, spleen, pancreas,  adrenal glands.  Symmetric renal enhancement.  Mild collecting system fullness bilaterally.  No obstructing intraluminal lesion identified.  Small amount of water attenuation intraperitoneal fluid.  There is diffuse mesenteric edema and the distal colon is decompressed. There is circumferential thickening and pericolonic fat stranding. Small bowel loops are diffusely thick walled and there are distended loops up to 3.5 cm.  Air and fluid noted within colon.  There is a 10.3 x 6.7 cm peripherally enhancing collection within the pelvis, in keeping with an abscess. Vaginal pessary again noted.  Sigmoid colonic diverticulosis.  The bladder is displaced anteriorly.  A smaller peripherally enhancing collection within the right paracolic gutter measures 1.6 x 2.8 x 3.4 cm.  Patent aorta and branch vessels.  No acute osseous finding.  IMPRESSION: Peripherally enhancing collection within the pelvis is concerning for abscess, measuring up to 10.3 x 6.7 cm.  Smaller peripherally enhancing collection within the right paracolic gutter measuring 1.6 x 2.8 cm.  Small bowel loops are distended and thick walled.  In keeping with a nonspecific enteritis (infectious, inflammatory, and ischemic considerations).  The colon is relatively decompressed distally with circumferential thickening.  Therefore, may reflect a nonspecific colitis.  Small bilateral pleural effusions with associated atelectasis. Small amount of free intraperitoneal fluid measuring water attenuation.  Discussed via telephone with Dr. Deretha Emory at 12:10 a.m. on 01/11/2012   Original Report Authenticated By: Waneta Martins, M.D.     Scheduled Meds:   . metronidazole  500 mg Intravenous Q8H  . piperacillin-tazobactam (ZOSYN)  IV  3.375 g Intravenous Q8H  . potassium chloride  10 mEq Intravenous Q1 Hr x 4  . sodium chloride  3 mL Intravenous Q12H  . sodium chloride      . vancomycin  1,250 mg Intravenous Q12H  . DISCONTD: vancomycin  750 mg Intravenous  Q12H   Continuous Infusions:   . 0.9 % NaCl with KCl 20 mEq / L 100 mL/hr at 01/12/12 0800    Principal Problem:  *Intra-abdominal abscess post-procedure Active Problems:  Hypokalemia  Dehydration  Diarrhea  Malnutrition, calorie  Anemia    Time spent: greater than 30 mins    Mikiya Nebergall  [facility] Pager [phone]. If 7PM-7AM, please contact night-coverage at [email or web address], password [facility] 01/12/2012, 4:33 PM  LOS: 2 days

## 2012-01-13 ENCOUNTER — Encounter (HOSPITAL_COMMUNITY): Payer: Medicare Other

## 2012-01-13 ENCOUNTER — Inpatient Hospital Stay (HOSPITAL_COMMUNITY): Payer: Medicare Other

## 2012-01-13 ENCOUNTER — Inpatient Hospital Stay: Admit: 2012-01-13 | Payer: Medicare Other

## 2012-01-13 DIAGNOSIS — D649 Anemia, unspecified: Secondary | ICD-10-CM

## 2012-01-13 LAB — CLOSTRIDIUM DIFFICILE BY PCR: Toxigenic C. Difficile by PCR: NEGATIVE

## 2012-01-13 LAB — CBC
HCT: 22.9 % — ABNORMAL LOW (ref 36.0–46.0)
MCV: 83.9 fL (ref 78.0–100.0)
Platelets: 578 10*3/uL — ABNORMAL HIGH (ref 150–400)
RBC: 2.73 MIL/uL — ABNORMAL LOW (ref 3.87–5.11)
RDW: 13.5 % (ref 11.5–15.5)
WBC: 21.8 10*3/uL — ABNORMAL HIGH (ref 4.0–10.5)

## 2012-01-13 LAB — BASIC METABOLIC PANEL
CO2: 26 mEq/L (ref 19–32)
Calcium: 6.5 mg/dL — ABNORMAL LOW (ref 8.4–10.5)
Chloride: 101 mEq/L (ref 96–112)
Creatinine, Ser: 0.43 mg/dL — ABNORMAL LOW (ref 0.50–1.10)
GFR calc Af Amer: >90 mL/min (ref >90)
Sodium: 135 mEq/L (ref 135–145)

## 2012-01-13 LAB — PHOSPHORUS: Phosphorus: 1.8 mg/dL — ABNORMAL LOW (ref 2.3–4.6)

## 2012-01-13 MED ORDER — ENOXAPARIN SODIUM 40 MG/0.4ML ~~LOC~~ SOLN
40.0000 mg | SUBCUTANEOUS | Status: DC
  Administered 2012-01-13 – 2012-01-17 (×5): 40 mg via SUBCUTANEOUS
  Filled 2012-01-13 (×5): qty 0.4

## 2012-01-13 MED ORDER — SODIUM CHLORIDE 0.9 % IJ SOLN
10.0000 mL | Freq: Two times a day (BID) | INTRAMUSCULAR | Status: DC
  Administered 2012-01-13 – 2012-01-14 (×2): 10 mL
  Filled 2012-01-13: qty 6

## 2012-01-13 MED ORDER — SODIUM CHLORIDE 0.9 % IJ SOLN
10.0000 mL | INTRAMUSCULAR | Status: DC | PRN
  Administered 2012-01-17: 10 mL
  Filled 2012-01-13: qty 3
  Filled 2012-01-13: qty 6
  Filled 2012-01-13: qty 3

## 2012-01-13 MED ORDER — MAGNESIUM SULFATE 40 MG/ML IJ SOLN
2.0000 g | Freq: Once | INTRAMUSCULAR | Status: DC
  Filled 2012-01-13: qty 50

## 2012-01-13 MED ORDER — POTASSIUM CHLORIDE CRYS ER 20 MEQ PO TBCR
40.0000 meq | EXTENDED_RELEASE_TABLET | ORAL | Status: AC
  Administered 2012-01-13 (×2): 40 meq via ORAL
  Filled 2012-01-13 (×2): qty 2

## 2012-01-13 MED ORDER — POTASSIUM PHOSPHATE DIBASIC 3 MMOLE/ML IV SOLN
20.0000 mmol | Freq: Once | INTRAVENOUS | Status: AC
  Administered 2012-01-13: 20 mmol via INTRAVENOUS
  Filled 2012-01-13: qty 6.67

## 2012-01-13 MED ORDER — MAGNESIUM SULFATE 40 MG/ML IJ SOLN
2.0000 g | Freq: Once | INTRAMUSCULAR | Status: AC
  Administered 2012-01-13: 2 g via INTRAVENOUS

## 2012-01-13 NOTE — Progress Notes (Signed)
MEDICATION RELATED CONSULT NOTE - INITIAL   Pharmacy Consult for Phosphorous replacement  Indication: hypophosphatemia  Allergies  Allergen Reactions  . Hydrocodone Other (See Comments)    REACTION: causes pains in stomach    Patient Measurements: Height: 5\' 2"  (157.5 cm) Weight: 128 lb 8.5 oz (58.3 kg) IBW/kg (Calculated) : 50.1   Vital Signs: Temp: 97.6 F (36.4 C) (10/03 0815) Temp src: Oral (10/03 0815) BP: 108/61 mmHg (10/03 0600) Pulse Rate: 92  (10/03 0600) Intake/Output from previous day: 10/02 0701 - 10/03 0700 In: 3051.3 [P.O.:360; I.V.:1841.3; IV Piggyback:850] Out: 1165 [Urine:1100; Drains:65] Intake/Output from this shift:    Labs:  Basename 01/13/12 0419 01/12/12 0426 01/11/12 1400 01/11/12 0426 01/11/12 0137 01/10/12 2152  WBC 21.8* 18.9* -- 16.8* -- --  HGB 7.9* 8.6* -- 9.3* -- --  HCT 22.9* 25.1* -- 26.9* -- --  PLT 578* 599* -- 566* -- --  APTT -- -- 33 -- -- --  CREATININE 0.43* 0.41* -- 0.40* -- --  LABCREA -- -- -- -- -- --  CREATININE 0.43* 0.41* -- 0.40* -- --  CREAT24HRUR -- -- -- -- -- --  MG 1.6 -- -- -- 1.5 --  PHOS 1.8* -- -- -- -- --  ALBUMIN -- -- -- 1.7* -- 1.9*  PROT -- -- -- 5.1* -- 5.6*  ALBUMIN -- -- -- 1.7* -- 1.9*  AST -- -- -- 21 -- 22  ALT -- -- -- 16 -- 19  ALKPHOS -- -- -- 113 -- 106  BILITOT -- -- -- 0.3 -- 0.3  BILIDIR -- -- -- -- -- --  IBILI -- -- -- -- -- --   Estimated Creatinine Clearance: 55.4 ml/min (by C-G formula based on Cr of 0.43).   Microbiology: Recent Results (from the past 720 hour(s))  URINE CULTURE     Status: Normal   Collection Time   01/10/12 10:38 PM      Component Value Range Status Comment   Specimen Description URINE, CLEAN CATCH   Final    Special Requests NONE   Final    Culture  Setup Time 01/11/2012 21:42   Final    Colony Count NO GROWTH   Final    Culture NO GROWTH   Final    Report Status 01/12/2012 FINAL   Final   CULTURE, BLOOD (ROUTINE X 2)     Status: Normal (Preliminary  result)   Collection Time   01/11/12  1:11 AM      Component Value Range Status Comment   Specimen Description BLOOD RIGHT ARM   Final    Special Requests BOTTLES DRAWN AEROBIC AND ANAEROBIC 6CC   Final    Culture NO GROWTH 1 DAY   Final    Report Status PENDING   Incomplete   CULTURE, BLOOD (ROUTINE X 2)     Status: Normal (Preliminary result)   Collection Time   01/11/12  1:12 AM      Component Value Range Status Comment   Specimen Description BLOOD LEFT HAND   Final    Special Requests BOTTLES DRAWN AEROBIC AND ANAEROBIC 6CC   Final    Culture NO GROWTH 1 DAY   Final    Report Status PENDING   Incomplete   MRSA PCR SCREENING     Status: Normal   Collection Time   01/11/12  3:33 AM      Component Value Range Status Comment   MRSA by PCR NEGATIVE  NEGATIVE Final   CULTURE, ROUTINE-ABSCESS  Status: Normal (Preliminary result)   Collection Time   01/12/12 11:19 AM      Component Value Range Status Comment   Specimen Description PERITONEAL CAVITY   Final    Special Requests Normal   Final    Gram Stain     Final    Value: ABUNDANT WBC PRESENT, PREDOMINANTLY PMN     NO SQUAMOUS EPITHELIAL CELLS SEEN     ABUNDANT GRAM NEGATIVE RODS     ABUNDANT GRAM POSITIVE COCCI IN PAIRS     IN CHAINS IN CLUSTERS   Culture MODERATE ESCHERICHIA COLI   Final    Report Status PENDING   Incomplete   ANAEROBIC CULTURE     Status: Normal (Preliminary result)   Collection Time   01/12/12 11:19 AM      Component Value Range Status Comment   Specimen Description PERITONEAL CAVITY   Final    Special Requests Normal   Final    Gram Stain     Final    Value: ABUNDANT WBC PRESENT, PREDOMINANTLY PMN     NO SQUAMOUS EPITHELIAL CELLS SEEN     ABUNDANT GRAM NEGATIVE RODS     ABUNDANT GRAM POSITIVE COCCI IN PAIRS     IN CLUSTERS IN CHAINS   Culture PENDING   Incomplete    Report Status PENDING   Incomplete     Medical History: Past Medical History  Diagnosis Date  . Arthritis   . Osteoporosis      Medications:  Scheduled:    . magnesium sulfate  2 g Intravenous Once  . metronidazole  500 mg Intravenous Q8H  . piperacillin-tazobactam (ZOSYN)  IV  3.375 g Intravenous Q8H  . potassium phosphate IVPB (mmol)  20 mmol Intravenous Once  . sodium chloride  3 mL Intravenous Q12H  . sodium chloride      . sodium chloride      . sodium chloride      . vancomycin  1,250 mg Intravenous Q12H  . DISCONTD: magnesium sulfate 1 - 4 g bolus IVPB  2 g Intravenous Once  . DISCONTD: vancomycin  750 mg Intravenous Q12H    Assessment: 66yo female admitted with abd abscess.  K and PO4 low today.  Replenish with K-Phos  Goal of Therapy:  Phosphorous and K+ replacement  Plan:  K-Phos 20 mmols today x 1 over 6 hours F/U labs tomorrow  Valrie Hart A 01/13/2012,9:00 AM

## 2012-01-13 NOTE — Progress Notes (Signed)
Swelling to left arm +2 in hand.  Able to use getting out of bed.  No complaints of pain.  Baseline warranted. Patient in need of additional IV access. Multiple antibiotics, pain, nausea medications, IVF's

## 2012-01-13 NOTE — Progress Notes (Signed)
[facility] PROGRESS NOTE  Yvette Branch ZOX:[id number] DOB: Oct 27, 1945 DOA: 01/10/2012 PCP: Colette Ribas, MD  Assessment/Plan:  Principal Problem:  *Intra-abdominal abscess post-procedure Active Problems:  Hypokalemia  Dehydration  Diarrhea  Malnutrition, calorie  Anemia  This lady was admitted to the hospital with progressively worse abdominal pain as well as diarrhea. She was noted to be significantly dehydrated and hypokalemic. CT scan of the abdomen and pelvis revealed a pelvic abscess. The patient was recently at [facility] with a small bowel obstruction and underwent surgical repair. She was admitted to the hospital for further treatments including drainage of abscess and IV antibiotics.  1. Intra-abdominal abscess in the setting of recent bowel surgery. Patient underwent placement of percutaneous drain yesterday. She is on broad-spectrum antibiotics with vancomycin and zosyn. Surgery is following. Follow up cultures from abscess. 2. Hypokalemia. Possibly due to diarrhea that has depleted her intracellular stores.  Will continue with replacement 3. Diarrhea. C. difficile pending. She is on Flagyl. 4. Anemia. Likely due to acute illness. Will continue to monitor and transfuse for hemoglobin less than 7.  She does not have any signs of bleeding.  Check stool occult blood. 5. Leukocytosis, appears to be worse today.  Patient does not appear toxic.  Will continue current antibiotics and observe. 6. Severe protein calorie malnutrition.  Nutrition following, she is tolerating her diet, encourage ensure. 7. Left upper extremity edema.  Check venous dopplers to rule out DVT  Code Status: full code Family Communication: discussed with patient at bedside Disposition Plan: pending further hospital course, if remains stable then transfer to floor in am   Consultants:  General surgery, Dr. Lovell Sheehan  Interventional radiology  Procedures:  Percutaneous drain  placement for intra abdominal abscess 10/2  Antibiotics:  Vancomycin 9/30  Zosyn 9/30  Flagyl 10/1  HPI/Subjective: Feeling a little better today. She was able to tolerate breakfast this morning. Abdominal pain feels a little better today.  No vomiting. She feels the diarrhea may be getting better.  It was noted that her left arm appears swollen  Objective: Filed Vitals:   01/13/12 0500 01/13/12 0525 01/13/12 0600 01/13/12 0815  BP: 97/72  108/61   Pulse:  84 92   Temp:    97.6 F (36.4 C)  TempSrc:    Oral  Resp: 21 14 20    Height:      Weight: 58.3 kg (128 lb 8.5 oz)     SpO2:  96% 92%     Intake/Output Summary (Last 24 hours) at 01/13/12 0928 Last data filed at 01/13/12 0600  Gross per 24 hour  Intake 3051.33 ml  Output   1165 ml  Net 1886.33 ml   Filed Weights   01/11/12 0330 01/12/12 0500 01/13/12 0500  Weight: 53.8 kg (118 lb 9.7 oz) 56.9 kg (125 lb 7.1 oz) 58.3 kg (128 lb 8.5 oz)    Exam:   General:  NAD  Cardiovascular: s1, s2, rrr  Respiratory: cta b  Abdomen: soft, nt, bs+, drain placed on left side posteriorly  EXT: 1+ edema in the LUE  Data Reviewed: Basic Metabolic Panel:  Lab 01/13/12 8119 01/12/12 0426 01/11/12 0426 01/11/12 0137 01/10/12 2152  NA 135 134* 134* -- 133*  K 2.8* 3.1* 2.4* -- 2.0*  CL 101 98 94* -- 91*  CO2 26 24 29  -- 32  GLUCOSE 100* 59* 107* -- 98  BUN 4* 5* 5* -- 7  CREATININE 0.43* 0.41* 0.40* -- 0.40*  CALCIUM 6.5* 7.2* 7.6* --  8.4  MG 1.6 -- -- 1.5 --  PHOS 1.8* -- -- -- --   Liver Function Tests:  Lab 01/11/12 0426 01/10/12 2152  AST 21 22  ALT 16 19  ALKPHOS 113 106  BILITOT 0.3 0.3  PROT 5.1* 5.6*  ALBUMIN 1.7* 1.9*    Lab 01/10/12 2152  LIPASE 64*  AMYLASE --   No results found for this basename: AMMONIA:5 in the last 168 hours CBC:  Lab 01/13/12 0419 01/12/12 0426 01/11/12 0426 01/10/12 2152  WBC 21.8* 18.9* 16.8* 16.6*  NEUTROABS -- -- -- 14.2*  HGB 7.9* 8.6* 9.3* 10.4*  HCT 22.9* 25.1*  26.9* 29.9*  MCV 83.9 83.7 84.1 83.3  PLT 578* 599* 566* 565*   Cardiac Enzymes: No results found for this basename: CKTOTAL:5,CKMB:5,CKMBINDEX:5,TROPONINI:5 in the last 168 hours BNP (last 3 results) No results found for this basename: PROBNP:3 in the last 8760 hours CBG: No results found for this basename: GLUCAP:5 in the last 168 hours  Recent Results (from the past 240 hour(s))  URINE CULTURE     Status: Normal   Collection Time   01/10/12 10:38 PM      Component Value Range Status Comment   Specimen Description URINE, CLEAN CATCH   Final    Special Requests NONE   Final    Culture  Setup Time 01/11/2012 21:42   Final    Colony Count NO GROWTH   Final    Culture NO GROWTH   Final    Report Status 01/12/2012 FINAL   Final   CULTURE, BLOOD (ROUTINE X 2)     Status: Normal (Preliminary result)   Collection Time   01/11/12  1:11 AM      Component Value Range Status Comment   Specimen Description BLOOD RIGHT ARM   Final    Special Requests BOTTLES DRAWN AEROBIC AND ANAEROBIC 6CC   Final    Culture NO GROWTH 1 DAY   Final    Report Status PENDING   Incomplete   CULTURE, BLOOD (ROUTINE X 2)     Status: Normal (Preliminary result)   Collection Time   01/11/12  1:12 AM      Component Value Range Status Comment   Specimen Description BLOOD LEFT HAND   Final    Special Requests BOTTLES DRAWN AEROBIC AND ANAEROBIC 6CC   Final    Culture NO GROWTH 1 DAY   Final    Report Status PENDING   Incomplete   MRSA PCR SCREENING     Status: Normal   Collection Time   01/11/12  3:33 AM      Component Value Range Status Comment   MRSA by PCR NEGATIVE  NEGATIVE Final   CULTURE, ROUTINE-ABSCESS     Status: Normal (Preliminary result)   Collection Time   01/12/12 11:19 AM      Component Value Range Status Comment   Specimen Description PERITONEAL CAVITY   Final    Special Requests Normal   Final    Gram Stain     Final    Value: ABUNDANT WBC PRESENT, PREDOMINANTLY PMN     NO SQUAMOUS EPITHELIAL  CELLS SEEN     ABUNDANT GRAM NEGATIVE RODS     ABUNDANT GRAM POSITIVE COCCI IN PAIRS     IN CHAINS IN CLUSTERS   Culture MODERATE ESCHERICHIA COLI   Final    Report Status PENDING   Incomplete   ANAEROBIC CULTURE     Status: Normal (Preliminary result)   Collection  Time   01/12/12 11:19 AM      Component Value Range Status Comment   Specimen Description PERITONEAL CAVITY   Final    Special Requests Normal   Final    Gram Stain     Final    Value: ABUNDANT WBC PRESENT, PREDOMINANTLY PMN     NO SQUAMOUS EPITHELIAL CELLS SEEN     ABUNDANT GRAM NEGATIVE RODS     ABUNDANT GRAM POSITIVE COCCI IN PAIRS     IN CLUSTERS IN CHAINS   Culture PENDING   Incomplete    Report Status PENDING   Incomplete      Studies: Ct Guided Abscess Drain  01/12/2012  *RADIOLOGY REPORT*  CT GUIDED ABSCESS DRAIN  Date: 01/12/2012  Clinical History: 66 year old female with loculated fluid collection concerning for abscess in the rectouterine recess of [place].  She is 12 days postop laparotomy for small bowel obstruction.  Procedures Performed: 1. CT guided placement of a 12 French peritoneal abscess drain  Interventional Radiologist:  Sterling Big, MD  Sedation: Moderate (conscious) sedation was used.  3 mg Versed, 200 mcg Fentanyl were administered intravenously.  The patient's vital signs were monitored continuously by radiology nursing throughout the procedure.  Sedation Time: 25 minutes  PROCEDURE/FINDINGS:   Informed consent was obtained from the patient following explanation of the procedure, risks, benefits and alternatives. The patient understands, agrees and consents for the procedure. All questions were addressed. A time out was performed.  Maximal barrier sterile technique utilized including caps, mask, sterile gowns, sterile gloves, large sterile drape, hand hygiene, and betadine skin prep.  A planning axial CT scan was performed.  The loculated fluid collection in the rectouterine recess of Riley Lam was  identified. An appropriate skin entry site was selected and marked.  Local anesthesia was achieved with infiltration of 1% lidocaine.  Under CT fluoroscopic guidance, an 18 gauge trocar needle was advanced through the skin, and soft tissues and into the fluid collection. Care was taken to pass the needle through the sacrospinous ligament and medial to the gluteal vessels.  A wire was then advanced into the pelvic fluid collection.  The skin track was then serially dilated to 12-French, and ultimately a 12-French Cook multipurpose drainage catheter was advanced over a wire and positioned within the fluid collection.  A total of 230 ml of frankly purulent, foul- smelling material was then successfully aspirated.  A sample was sent to microbiology for Gram stain and culture.  The catheter was then secured to the skin with 0- Prolene suture and a soft plastic bumper. An adhesive retention device was also used. The catheter was connected to a bulb suction device.  The patient tolerated the procedure very well, there was no immediate complication.  IMPRESSION:  Successful placement of a 12 French drainage catheter in the deep pelvic abscess.  A total of 240 ml of frankly purulent, foul- smelling fluid was successfully aspirated.  A sample was sent for Gram stain and culture.  Signed,  Sterling Big, MD Vascular & Interventional Radiologist [facility] Radiology   Original Report Authenticated By: Vilma Prader     Scheduled Meds:    . magnesium sulfate  2 g Intravenous Once  . metronidazole  500 mg Intravenous Q8H  . piperacillin-tazobactam (ZOSYN)  IV  3.375 g Intravenous Q8H  . potassium phosphate IVPB (mmol)  20 mmol Intravenous Once  . sodium chloride  3 mL Intravenous Q12H  . sodium chloride      . sodium chloride      .  sodium chloride      . vancomycin  1,250 mg Intravenous Q12H  . DISCONTD: magnesium sulfate 1 - 4 g bolus IVPB  2 g Intravenous Once  . DISCONTD: vancomycin  750 mg Intravenous Q12H    Continuous Infusions:    . 0.9 % NaCl with KCl 20 mEq / L 50 mL/hr at 01/13/12 0600    Principal Problem:  *Intra-abdominal abscess post-procedure Active Problems:  Hypokalemia  Dehydration  Diarrhea  Malnutrition, calorie  Anemia    Time spent:    [facility]  [facility] Pager [phone]. If 7PM-7AM, please contact night-coverage at [email or web address], password [facility] 01/13/2012, 9:28 AM  LOS: 3 days

## 2012-01-13 NOTE — Plan of Care (Signed)
Problem: Phase I Progression Outcomes Goal: OOB as tolerated unless otherwise ordered Outcome: Progressing Out of bed to bsc with standby assist with lines, patient is very cautious but is steady.  Improving every time out of bed Goal: Incision/dressings dry and intact Outcome: Progressing Dressing remains dry and intact, JP is sutured in place Goal: Sutures/staples intact Outcome: Progressing Sutures and dressing intact Goal: Tubes/drains patent Outcome: Progressing JP drain patent Goal: Initial discharge plan identified Outcome: Progressing Home

## 2012-01-13 NOTE — Progress Notes (Signed)
Subjective: States she feels better. Still with some lower abdominal pain.  Objective: Vital signs in last 24 hours: Temp:  [97.6 F (36.4 C)-98.4 F (36.9 C)] 97.6 F (36.4 C) (10/03 0815) Pulse Rate:  [82-108] 92  (10/03 0600) Resp:  [11-21] 20  (10/03 0600) BP: (78-125)/(39-86) 108/61 mmHg (10/03 0600) SpO2:  [89 %-100 %] 92 % (10/03 0600) Weight:  [58.3 kg (128 lb 8.5 oz)] 58.3 kg (128 lb 8.5 oz) (10/03 0500) Last BM Date: 01/11/12  Intake/Output from previous day: 10/02 0701 - 10/03 0700 In: 3051.3 [P.O.:360; I.V.:1841.3; IV Piggyback:850] Out: 1165 [Urine:1100; Drains:65] Intake/Output this shift:    General appearance: alert, cooperative and no distress GI: Soft, mildly tender in the suprapubic region. Incision healing well.  Lab Results:   [facility] 01/13/12 0419 01/12/12 0426  WBC 21.8* 18.9*  HGB 7.9* 8.6*  HCT 22.9* 25.1*  PLT 578* 599*   BMET  Basename 01/13/12 0419 01/12/12 0426  NA 135 134*  K 2.8* 3.1*  CL 101 98  CO2 26 24  GLUCOSE 100* 59*  BUN 4* 5*  CREATININE 0.43* 0.41*  CALCIUM 6.5* 7.2*   PT/INR  Basename 01/11/12 1400  LABPROT 13.9  INR 1.08    Studies/Results: Ct Guided Abscess Drain  01/12/2012  *RADIOLOGY REPORT*  CT GUIDED ABSCESS DRAIN  Date: 01/12/2012  Clinical History: 66 year old female with loculated fluid collection concerning for abscess in the rectouterine recess of Douglas.  She is 12 days postop laparotomy for small bowel obstruction.  Procedures Performed: 1. CT guided placement of a 12 French peritoneal abscess drain  Interventional Radiologist:  Sterling Big, MD  Sedation: Moderate (conscious) sedation was used.  3 mg Versed, 200 mcg Fentanyl were administered intravenously.  The patient's vital signs were monitored continuously by radiology nursing throughout the procedure.  Sedation Time: 25 minutes  PROCEDURE/FINDINGS:   Informed consent was obtained from the patient following explanation of the procedure,  risks, benefits and alternatives. The patient understands, agrees and consents for the procedure. All questions were addressed. A time out was performed.  Maximal barrier sterile technique utilized including caps, mask, sterile gowns, sterile gloves, large sterile drape, hand hygiene, and betadine skin prep.  A planning axial CT scan was performed.  The loculated fluid collection in the rectouterine recess of Riley Lam was identified. An appropriate skin entry site was selected and marked.  Local anesthesia was achieved with infiltration of 1% lidocaine.  Under CT fluoroscopic guidance, an 18 gauge trocar needle was advanced through the skin, and soft tissues and into the fluid collection. Care was taken to pass the needle through the sacrospinous ligament and medial to the gluteal vessels.  A wire was then advanced into the pelvic fluid collection.  The skin track was then serially dilated to 12-French, and ultimately a 12-French Cook multipurpose drainage catheter was advanced over a wire and positioned within the fluid collection.  A total of 230 ml of frankly purulent, foul- smelling material was then successfully aspirated.  A sample was sent to microbiology for Gram stain and culture.  The catheter was then secured to the skin with 0- Prolene suture and a soft plastic bumper. An adhesive retention device was also used. The catheter was connected to a bulb suction device.  The patient tolerated the procedure very well, there was no immediate complication.  IMPRESSION:  Successful placement of a 12 French drainage catheter in the deep pelvic abscess.  A total of 240 ml of frankly purulent, foul- smelling fluid  was successfully aspirated.  A sample was sent for Gram stain and culture.  Signed,  Sterling Big, MD Vascular & Interventional Radiologist [facility] Radiology   Original Report Authenticated By: Vilma Prader     Anti-infectives: Anti-infectives     Start     Dose/Rate Route Frequency Ordered Stop    01/12/12 1500   vancomycin (VANCOCIN) 1,250 mg in sodium chloride 0.9 % 250 mL IVPB        1,250 mg 166.7 mL/hr over 90 Minutes Intravenous Every 12 hours 01/12/12 1457     01/11/12 1500   vancomycin (VANCOCIN) 750 mg in sodium chloride 0.9 % 150 mL IVPB  Status:  Discontinued        750 mg 150 mL/hr over 60 Minutes Intravenous Every 12 hours 01/11/12 0358 01/12/12 1457   01/11/12 0900   metroNIDAZOLE (FLAGYL) IVPB 500 mg        500 mg 100 mL/hr over 60 Minutes Intravenous Every 8 hours 01/11/12 0820     01/11/12 0500  piperacillin-tazobactam (ZOSYN) IVPB 3.375 g       3.375 g 12.5 mL/hr over 240 Minutes Intravenous Every 8 hours 01/11/12 0358     01/11/12 0130   vancomycin (VANCOCIN) IVPB 1000 mg/200 mL premix  Status:  Discontinued        1,000 mg 200 mL/hr over 60 Minutes Intravenous  Once 01/11/12 0121 01/11/12 0337   01/11/12 0130   piperacillin-tazobactam (ZOSYN) IVPB 3.375 g  Status:  Discontinued        3.375 g 12.5 mL/hr over 240 Minutes Intravenous  Once 01/11/12 0121 01/11/12 2130          Assessment/Plan: Impression: Stable, status post CT-guided drainage of pelvic abscess. Still awaiting ID of bacteria. We'll continue triple antibiotics. Will monitor leukocytosis. Will have PICC line placed. Supplement phosphorus.  LOS: 3 days    Esequiel Kleinfelter A 01/13/2012

## 2012-01-14 ENCOUNTER — Inpatient Hospital Stay (HOSPITAL_COMMUNITY): Payer: Medicare Other

## 2012-01-14 DIAGNOSIS — D473 Essential (hemorrhagic) thrombocythemia: Secondary | ICD-10-CM | POA: Diagnosis present

## 2012-01-14 DIAGNOSIS — D509 Iron deficiency anemia, unspecified: Secondary | ICD-10-CM

## 2012-01-14 DIAGNOSIS — R609 Edema, unspecified: Secondary | ICD-10-CM | POA: Diagnosis present

## 2012-01-14 DIAGNOSIS — M7989 Other specified soft tissue disorders: Secondary | ICD-10-CM

## 2012-01-14 LAB — BASIC METABOLIC PANEL
Calcium: 7.3 mg/dL — ABNORMAL LOW (ref 8.4–10.5)
GFR calc non Af Amer: >90 mL/min (ref >90)
Glucose, Bld: 116 mg/dL — ABNORMAL HIGH (ref 70–99)
Potassium: 4.2 mEq/L (ref 3.5–5.1)
Sodium: 135 mEq/L (ref 135–145)

## 2012-01-14 LAB — PHOSPHORUS: Phosphorus: 2 mg/dL — ABNORMAL LOW (ref 2.3–4.6)

## 2012-01-14 LAB — CULTURE, ROUTINE-ABSCESS: Special Requests: NORMAL

## 2012-01-14 LAB — CBC
Platelets: 733 10*3/uL — ABNORMAL HIGH (ref 150–400)
RBC: 3.08 MIL/uL — ABNORMAL LOW (ref 3.87–5.11)
RDW: 13.6 % (ref 11.5–15.5)
WBC: 22.2 10*3/uL — ABNORMAL HIGH (ref 4.0–10.5)

## 2012-01-14 MED ORDER — LOPERAMIDE HCL 2 MG PO CAPS
4.0000 mg | ORAL_CAPSULE | Freq: Once | ORAL | Status: AC
  Administered 2012-01-14: 4 mg via ORAL
  Filled 2012-01-14: qty 2

## 2012-01-14 MED ORDER — FUROSEMIDE 20 MG PO TABS
20.0000 mg | ORAL_TABLET | Freq: Two times a day (BID) | ORAL | Status: DC
  Administered 2012-01-14 (×2): 20 mg via ORAL
  Filled 2012-01-14 (×3): qty 1

## 2012-01-14 MED ORDER — ONDANSETRON HCL 4 MG/2ML IJ SOLN
4.0000 mg | INTRAMUSCULAR | Status: DC | PRN
  Administered 2012-01-14 – 2012-01-18 (×9): 4 mg via INTRAVENOUS
  Filled 2012-01-14 (×10): qty 2

## 2012-01-14 MED ORDER — METRONIDAZOLE 500 MG PO TABS
500.0000 mg | ORAL_TABLET | Freq: Three times a day (TID) | ORAL | Status: DC
  Administered 2012-01-14 – 2012-01-15 (×4): 500 mg via ORAL
  Filled 2012-01-14 (×4): qty 1

## 2012-01-14 MED ORDER — K PHOS MONO-SOD PHOS DI & MONO 155-852-130 MG PO TABS
250.0000 mg | ORAL_TABLET | Freq: Two times a day (BID) | ORAL | Status: DC
  Administered 2012-01-14 (×2): 250 mg via ORAL
  Filled 2012-01-14 (×7): qty 1

## 2012-01-14 MED ORDER — PRO-STAT SUGAR FREE PO LIQD
30.0000 mL | Freq: Two times a day (BID) | ORAL | Status: DC
  Administered 2012-01-14 (×2): 30 mL via ORAL
  Filled 2012-01-14 (×2): qty 30

## 2012-01-14 MED ORDER — BOOST / RESOURCE BREEZE PO LIQD
1.0000 | Freq: Two times a day (BID) | ORAL | Status: DC
  Administered 2012-01-14: 1 via ORAL
  Filled 2012-01-14 (×8): qty 1

## 2012-01-14 MED ORDER — POTASSIUM PHOSPHATE DIBASIC 3 MMOLE/ML IV SOLN
20.0000 mmol | Freq: Once | INTRAVENOUS | Status: AC
  Administered 2012-01-14: 20 mmol via INTRAVENOUS
  Filled 2012-01-14: qty 6.67

## 2012-01-14 MED ORDER — ONDANSETRON HCL 4 MG PO TABS: 4.0000 mg | ORAL_TABLET | ORAL | Status: DC | PRN

## 2012-01-14 NOTE — Progress Notes (Signed)
Pt was seen on 01-11-12 for an eval.  At that time, pt was found to be very close to prior functional level and ambulated independently for functional distances.  Her strength and balance were WNL.  She stated that she had family members who could support her at home.  I attempted to see pt for re-assess per MD request.  Pt declined working with me...she stated that she had just gotten to her new room and felt like she might have some more diarrhea.  We discussed her current status and she reports that she is able to get up OOB and is able to ambulate in the room.  The aide confirms this.  I have asked the nursing service to ambulate pt in the hallway when pt is agreeable to do this.  If she is now unable to ambulate, please reconsult.  Thanks!

## 2012-01-14 NOTE — Progress Notes (Addendum)
ANTIBIOTIC CONSULT NOTE   Pharmacy Consult for Zosyn & vancomycin Indication:   Intra-abdominal abcess  Allergies  Allergen Reactions  . Hydrocodone Other (See Comments)    REACTION: causes pains in stomach   Patient Measurements: Height: 5\' 2"  (157.5 cm) Weight: 133 lb 13.1 oz (60.7 kg) IBW/kg (Calculated) : 50.1   Vital Signs: Temp: 97.6 F (36.4 C) (10/04 0400) Temp src: Oral (10/04 0400) BP: 97/49 mmHg (10/04 0600) Pulse Rate: 83  (10/04 0600) Intake/Output from previous day: 10/03 0701 - 10/04 0700 In: 2666.7 [I.V.:1160; IV Piggyback:1506.7] Out: 1740 [Urine:1700; Drains:40] Intake/Output from this shift:    Labs:  [facility] 01/14/12 0446 01/13/12 0419 01/12/12 0426  WBC 22.2* 21.8* 18.9*  HGB 8.8* 7.9* 8.6*  PLT 733* 578* 599*  LABCREA -- -- --  CREATININE 0.48* 0.43* 0.41*   Estimated Creatinine Clearance: 60.1 ml/min (by C-G formula based on Cr of 0.48).  Microbiology: Recent Results (from the past 720 hour(s))  URINE CULTURE     Status: Normal   Collection Time   01/10/12 10:38 PM      Component Value Range Status Comment   Specimen Description URINE, CLEAN CATCH   Final    Special Requests NONE   Final    Culture  Setup Time 01/11/2012 21:42   Final    Colony Count NO GROWTH   Final    Culture NO GROWTH   Final    Report Status 01/12/2012 FINAL   Final   CULTURE, BLOOD (ROUTINE X 2)     Status: Normal (Preliminary result)   Collection Time   01/11/12  1:11 AM      Component Value Range Status Comment   Specimen Description BLOOD RIGHT ARM   Final    Special Requests BOTTLES DRAWN AEROBIC AND ANAEROBIC 6CC   Final    Culture NO GROWTH 3 DAYS   Final    Report Status PENDING   Incomplete   CULTURE, BLOOD (ROUTINE X 2)     Status: Normal (Preliminary result)   Collection Time   01/11/12  1:12 AM      Component Value Range Status Comment   Specimen Description BLOOD LEFT HAND   Final    Special Requests BOTTLES DRAWN AEROBIC AND ANAEROBIC 6CC   Final     Culture NO GROWTH 3 DAYS   Final    Report Status PENDING   Incomplete   MRSA PCR SCREENING     Status: Normal   Collection Time   01/11/12  3:33 AM      Component Value Range Status Comment   MRSA by PCR NEGATIVE  NEGATIVE Final   CULTURE, ROUTINE-ABSCESS     Status: Normal (Preliminary result)   Collection Time   01/12/12 11:19 AM      Component Value Range Status Comment   Specimen Description PERITONEAL CAVITY   Final    Special Requests Normal   Final    Gram Stain     Final    Value: ABUNDANT WBC PRESENT, PREDOMINANTLY PMN     NO SQUAMOUS EPITHELIAL CELLS SEEN     ABUNDANT GRAM NEGATIVE RODS     ABUNDANT GRAM POSITIVE COCCI IN PAIRS     IN CHAINS IN CLUSTERS   Culture MODERATE ESCHERICHIA COLI   Final    Report Status PENDING   Incomplete   ANAEROBIC CULTURE     Status: Normal (Preliminary result)   Collection Time   01/12/12 11:19 AM      Component  Value Range Status Comment   Specimen Description PERITONEAL CAVITY   Final    Special Requests Normal   Final    Gram Stain     Final    Value: ABUNDANT WBC PRESENT, PREDOMINANTLY PMN     NO SQUAMOUS EPITHELIAL CELLS SEEN     ABUNDANT GRAM NEGATIVE RODS     ABUNDANT GRAM POSITIVE COCCI IN PAIRS     IN CLUSTERS IN CHAINS   Culture     Final    Value: NO ANAEROBES ISOLATED; CULTURE IN PROGRESS FOR 5 DAYS   Report Status PENDING   Incomplete   CLOSTRIDIUM DIFFICILE BY PCR     Status: Normal   Collection Time   01/13/12  9:51 PM      Component Value Range Status Comment   C difficile by pcr NEGATIVE  NEGATIVE Final    Medical History: Past Medical History  Diagnosis Date  . Arthritis   . Osteoporosis    Medications:  Scheduled:     . enoxaparin (LOVENOX) injection  40 mg Subcutaneous Q24H  . feeding supplement  30 mL Oral BID WC  . feeding supplement  1 Container Oral BID BM  . magnesium sulfate  2 g Intravenous Once  . metronidazole  500 mg Intravenous Q8H  . piperacillin-tazobactam (ZOSYN)  IV  3.375 g  Intravenous Q8H  . potassium chloride  40 mEq Oral Q3H  . potassium phosphate IVPB (mmol)  20 mmol Intravenous Once  . potassium phosphate IVPB (mmol)  20 mmol Intravenous Once  . sodium chloride  10-40 mL Intracatheter Q12H  . sodium chloride  3 mL Intravenous Q12H  . vancomycin  1,250 mg Intravenous Q12H  . DISCONTD: magnesium sulfate 1 - 4 g bolus IVPB  2 g Intravenous Once   Infusions:     . 0.9 % NaCl with KCl 20 mEq / L 50 mL/hr at 01/14/12 0600   PRN: acetaminophen, acetaminophen, albuterol, ALPRAZolam, HYDROmorphone (DILAUDID) injection, ondansetron (ZOFRAN) IV, ondansetron, sodium chloride  Assessment: Patient with intra-abdominal abscess on empiric, broad-spectrum antibiotics with Vancomycin & Zosyn & Flagyl.  Cultures noted as above.  Still with leukocytosis.  Excellent renal function which is stable.  Trough level was below desired goal range therefore Vanco increased.  Goal of Therapy:  Vancomycin trough 15-51mcg/ml  Plan:  1) Vancomycin 1250mg  IV Q12h 2) Continue Zosyn and Flagyl 3) Weekly trough level & Scr while on Vancomycin 4) Monitor renal function and cx data  5) Duration of ABX per MD  Valrie Hart A 01/14/2012,7:48 AM

## 2012-01-14 NOTE — Progress Notes (Signed)
MEDICATION RELATED CONSULT NOTE   Pharmacy Consult for Phosphorous replacement  Indication: hypophosphatemia  Allergies  Allergen Reactions  . Hydrocodone Other (See Comments)    REACTION: causes pains in stomach   Patient Measurements: Height: 5\' 2"  (157.5 cm) Weight: 133 lb 13.1 oz (60.7 kg) IBW/kg (Calculated) : 50.1   Vital Signs: Temp: 97.6 F (36.4 C) (10/04 0400) Temp src: Oral (10/04 0400) BP: 97/49 mmHg (10/04 0600) Pulse Rate: 83  (10/04 0600) Intake/Output from previous day: 10/03 0701 - 10/04 0700 In: 2666.7 [I.V.:1160; IV Piggyback:1506.7] Out: 1740 [Urine:1700; Drains:40] Intake/Output from this shift:    Labs:  Basename 01/14/12 0446 01/13/12 0419 01/12/12 0426 01/11/12 1400  WBC 22.2* 21.8* 18.9* --  HGB 8.8* 7.9* 8.6* --  HCT 25.6* 22.9* 25.1* --  PLT 733* 578* 599* --  APTT -- -- -- 33  CREATININE 0.48* 0.43* 0.41* --  LABCREA -- -- -- --  CREATININE 0.48* 0.43* 0.41* --  CREAT24HRUR -- -- -- --  MG -- 1.6 -- --  PHOS 2.0* 1.8* -- --  ALBUMIN -- -- -- --  PROT -- -- -- --  ALBUMIN -- -- -- --  AST -- -- -- --  ALT -- -- -- --  ALKPHOS -- -- -- --  BILITOT -- -- -- --  BILIDIR -- -- -- --  IBILI -- -- -- --   Estimated Creatinine Clearance: 60.1 ml/min (by C-G formula based on Cr of 0.48).  Microbiology: Recent Results (from the past 720 hour(s))  URINE CULTURE     Status: Normal   Collection Time   01/10/12 10:38 PM      Component Value Range Status Comment   Specimen Description URINE, CLEAN CATCH   Final    Special Requests NONE   Final    Culture  Setup Time 01/11/2012 21:42   Final    Colony Count NO GROWTH   Final    Culture NO GROWTH   Final    Report Status 01/12/2012 FINAL   Final   CULTURE, BLOOD (ROUTINE X 2)     Status: Normal (Preliminary result)   Collection Time   01/11/12  1:11 AM      Component Value Range Status Comment   Specimen Description BLOOD RIGHT ARM   Final    Special Requests BOTTLES DRAWN AEROBIC AND  ANAEROBIC 6CC   Final    Culture NO GROWTH 3 DAYS   Final    Report Status PENDING   Incomplete   CULTURE, BLOOD (ROUTINE X 2)     Status: Normal (Preliminary result)   Collection Time   01/11/12  1:12 AM      Component Value Range Status Comment   Specimen Description BLOOD LEFT HAND   Final    Special Requests BOTTLES DRAWN AEROBIC AND ANAEROBIC 6CC   Final    Culture NO GROWTH 3 DAYS   Final    Report Status PENDING   Incomplete   MRSA PCR SCREENING     Status: Normal   Collection Time   01/11/12  3:33 AM      Component Value Range Status Comment   MRSA by PCR NEGATIVE  NEGATIVE Final   CULTURE, ROUTINE-ABSCESS     Status: Normal (Preliminary result)   Collection Time   01/12/12 11:19 AM      Component Value Range Status Comment   Specimen Description PERITONEAL CAVITY   Final    Special Requests Normal   Final    Gram  Stain     Final    Value: ABUNDANT WBC PRESENT, PREDOMINANTLY PMN     NO SQUAMOUS EPITHELIAL CELLS SEEN     ABUNDANT GRAM NEGATIVE RODS     ABUNDANT GRAM POSITIVE COCCI IN PAIRS     IN CHAINS IN CLUSTERS   Culture MODERATE ESCHERICHIA COLI   Final    Report Status PENDING   Incomplete   ANAEROBIC CULTURE     Status: Normal (Preliminary result)   Collection Time   01/12/12 11:19 AM      Component Value Range Status Comment   Specimen Description PERITONEAL CAVITY   Final    Special Requests Normal   Final    Gram Stain     Final    Value: ABUNDANT WBC PRESENT, PREDOMINANTLY PMN     NO SQUAMOUS EPITHELIAL CELLS SEEN     ABUNDANT GRAM NEGATIVE RODS     ABUNDANT GRAM POSITIVE COCCI IN PAIRS     IN CLUSTERS IN CHAINS   Culture     Final    Value: NO ANAEROBES ISOLATED; CULTURE IN PROGRESS FOR 5 DAYS   Report Status PENDING   Incomplete   CLOSTRIDIUM DIFFICILE BY PCR     Status: Normal   Collection Time   01/13/12  9:51 PM      Component Value Range Status Comment   C difficile by pcr NEGATIVE  NEGATIVE Final    Medical History: Past Medical History    Diagnosis Date  . Arthritis   . Osteoporosis    Medications:  Scheduled:     . enoxaparin (LOVENOX) injection  40 mg Subcutaneous Q24H  . feeding supplement  30 mL Oral BID WC  . feeding supplement  1 Container Oral BID BM  . magnesium sulfate  2 g Intravenous Once  . metronidazole  500 mg Intravenous Q8H  . piperacillin-tazobactam (ZOSYN)  IV  3.375 g Intravenous Q8H  . potassium chloride  40 mEq Oral Q3H  . potassium phosphate IVPB (mmol)  20 mmol Intravenous Once  . potassium phosphate IVPB (mmol)  20 mmol Intravenous Once  . sodium chloride  10-40 mL Intracatheter Q12H  . sodium chloride  3 mL Intravenous Q12H  . vancomycin  1,250 mg Intravenous Q12H  . DISCONTD: magnesium sulfate 1 - 4 g bolus IVPB  2 g Intravenous Once   Assessment: 66yo female admitted with abd abscess.  PO4 remains low today despite K-PHos yesterday.  Serum K+ WNL today.  Replenish PO4 with K-Phos.  Goal of Therapy:  Phosphorous replacement  Plan:  K-Phos 20 mmols today x 1 over 6 hours F/U labs tomorrow  Valrie Hart A 01/14/2012,7:46 AM

## 2012-01-14 NOTE — Progress Notes (Signed)
Subjective: Patient has some left flank soreness, but she says that she does not require pain medication for it. She has no nausea vomiting. She complains of generalized swelling in her legs. She denies shortness of breath or chest pain. She had 2 small soft stools yesterday. No diarrhea.  Objective: Vital signs in last 24 hours: Filed Vitals:   01/14/12 0000 01/14/12 0400 01/14/12 0500 01/14/12 0600  BP:    97/49  Pulse:    83  Temp: 97.8 F (36.6 C) 97.6 F (36.4 C)    TempSrc: Oral Oral    Resp:    12  Height:      Weight:   60.7 kg (133 lb 13.1 oz)   SpO2:    95%    Intake/Output Summary (Last 24 hours) at 01/14/12 0830 Last data filed at 01/14/12 0600  Gross per 24 hour  Intake 2516.67 ml  Output   1740 ml  Net 776.67 ml    Weight change: 2.4 kg (5 lb 4.7 oz)  Physical exam: General: Pleasant alert 66 year old Caucasian woman lying in bed, in no acute distress. Lungs: Decreased breath sounds in the bases and rare wheeze, no respiratory distress. Heart: S1, S2, with no murmurs rubs or gallops. Abdomen: Healing vertical scar. Left flank drain in place, draining yellow serous fluid. Hypoactive bowel sounds, mildly tender over the left flank. Extremities: 1+ left upper extremity edema. 1+ bilateral lower extremity edema. PICC line in place in the right upper extremity. Pedal pulses palpable. Neurologic: She is alert and oriented x3. Cranial nerves II through XII are intact.  Lab Results: Basic Metabolic Panel:  Basename 01/14/12 0446 01/13/12 0419  NA 135 135  K 4.2 2.8*  CL 102 101  CO2 28 26  GLUCOSE 116* 100*  BUN 3* 4*  CREATININE 0.48* 0.43*  CALCIUM 7.3* 6.5*  MG -- 1.6  PHOS 2.0* 1.8*   Liver Function Tests: No results found for this basename: AST:2,ALT:2,ALKPHOS:2,BILITOT:2,PROT:2,ALBUMIN:2 in the last 72 hours No results found for this basename: LIPASE:2,AMYLASE:2 in the last 72 hours No results found for this basename: AMMONIA:2 in the last 72  hours CBC:  Basename 01/14/12 0446 01/13/12 0419  WBC 22.2* 21.8*  NEUTROABS -- --  HGB 8.8* 7.9*  HCT 25.6* 22.9*  MCV 83.1 83.9  PLT 733* 578*   Cardiac Enzymes: No results found for this basename: CKTOTAL:3,CKMB:3,CKMBINDEX:3,TROPONINI:3 in the last 72 hours BNP: No results found for this basename: PROBNP:3 in the last 72 hours D-Dimer: No results found for this basename: DDIMER:2 in the last 72 hours CBG: No results found for this basename: GLUCAP:6 in the last 72 hours Hemoglobin A1C: No results found for this basename: HGBA1C in the last 72 hours Fasting Lipid Panel: No results found for this basename: CHOL,HDL,LDLCALC,TRIG,CHOLHDL,LDLDIRECT in the last 72 hours Thyroid Function Tests: No results found for this basename: TSH,T4TOTAL,FREET4,T3FREE,THYROIDAB in the last 72 hours Anemia Panel: No results found for this basename: VITAMINB12,FOLATE,FERRITIN,TIBC,IRON,RETICCTPCT in the last 72 hours Coagulation:  Basename 01/11/12 1400  LABPROT 13.9  INR 1.08   Urine Drug Screen: Drugs of Abuse  No results found for this basename: labopia, cocainscrnur, labbenz, amphetmu, thcu, labbarb    Alcohol Level: No results found for this basename: ETH:2 in the last 72 hours Urinalysis: No results found for this basename: COLORURINE:2,APPERANCEUR:2,LABSPEC:2,PHURINE:2,GLUCOSEU:2,HGBUR:2,BILIRUBINUR:2,KETONESUR:2,PROTEINUR:2,UROBILINOGEN:2,NITRITE:2,LEUKOCYTESUR:2 in the last 72 hours Misc. Labs:   Micro: Recent Results (from the past 240 hour(s))  URINE CULTURE     Status: Normal   Collection Time   01/10/12  10:38 PM      Component Value Range Status Comment   Specimen Description URINE, CLEAN CATCH   Final    Special Requests NONE   Final    Culture  Setup Time 01/11/2012 21:42   Final    Colony Count NO GROWTH   Final    Culture NO GROWTH   Final    Report Status 01/12/2012 FINAL   Final   CULTURE, BLOOD (ROUTINE X 2)     Status: Normal (Preliminary result)    Collection Time   01/11/12  1:11 AM      Component Value Range Status Comment   Specimen Description BLOOD RIGHT ARM   Final    Special Requests BOTTLES DRAWN AEROBIC AND ANAEROBIC 6CC   Final    Culture NO GROWTH 3 DAYS   Final    Report Status PENDING   Incomplete   CULTURE, BLOOD (ROUTINE X 2)     Status: Normal (Preliminary result)   Collection Time   01/11/12  1:12 AM      Component Value Range Status Comment   Specimen Description BLOOD LEFT HAND   Final    Special Requests BOTTLES DRAWN AEROBIC AND ANAEROBIC 6CC   Final    Culture NO GROWTH 3 DAYS   Final    Report Status PENDING   Incomplete   MRSA PCR SCREENING     Status: Normal   Collection Time   01/11/12  3:33 AM      Component Value Range Status Comment   MRSA by PCR NEGATIVE  NEGATIVE Final   CULTURE, ROUTINE-ABSCESS     Status: Normal   Collection Time   01/12/12 11:19 AM      Component Value Range Status Comment   Specimen Description PERITONEAL CAVITY   Final    Special Requests Normal   Final    Gram Stain     Final    Value: ABUNDANT WBC PRESENT, PREDOMINANTLY PMN     NO SQUAMOUS EPITHELIAL CELLS SEEN     ABUNDANT GRAM NEGATIVE RODS     ABUNDANT GRAM POSITIVE COCCI IN PAIRS     IN CHAINS IN CLUSTERS   Culture MODERATE ESCHERICHIA COLI   Final    Report Status 01/14/2012 FINAL   Final    Organism ID, Bacteria ESCHERICHIA COLI   Final   ANAEROBIC CULTURE     Status: Normal (Preliminary result)   Collection Time   01/12/12 11:19 AM      Component Value Range Status Comment   Specimen Description PERITONEAL CAVITY   Final    Special Requests Normal   Final    Gram Stain     Final    Value: ABUNDANT WBC PRESENT, PREDOMINANTLY PMN     NO SQUAMOUS EPITHELIAL CELLS SEEN     ABUNDANT GRAM NEGATIVE RODS     ABUNDANT GRAM POSITIVE COCCI IN PAIRS     IN CLUSTERS IN CHAINS   Culture     Final    Value: NO ANAEROBES ISOLATED; CULTURE IN PROGRESS FOR 5 DAYS   Report Status PENDING   Incomplete   CLOSTRIDIUM DIFFICILE  BY PCR     Status: Normal   Collection Time   01/13/12  9:51 PM      Component Value Range Status Comment   C difficile by pcr NEGATIVE  NEGATIVE Final     Studies/Results: Ct Guided Abscess Drain  01/12/2012  *RADIOLOGY REPORT*  CT GUIDED ABSCESS DRAIN  Date: 01/12/2012  Clinical History: 66 year old female with loculated fluid collection concerning for abscess in the rectouterine recess of [place].  She is 12 days postop laparotomy for small bowel obstruction.  Procedures Performed: 1. CT guided placement of a 12 French peritoneal abscess drain  Interventional Radiologist:  Sterling Big, MD  Sedation: Moderate (conscious) sedation was used.  3 mg Versed, 200 mcg Fentanyl were administered intravenously.  The patient's vital signs were monitored continuously by radiology nursing throughout the procedure.  Sedation Time: 25 minutes  PROCEDURE/FINDINGS:   Informed consent was obtained from the patient following explanation of the procedure, risks, benefits and alternatives. The patient understands, agrees and consents for the procedure. All questions were addressed. A time out was performed.  Maximal barrier sterile technique utilized including caps, mask, sterile gowns, sterile gloves, large sterile drape, hand hygiene, and betadine skin prep.  A planning axial CT scan was performed.  The loculated fluid collection in the rectouterine recess of [place] was identified. An appropriate skin entry site was selected and marked.  Local anesthesia was achieved with infiltration of 1% lidocaine.  Under CT fluoroscopic guidance, an 18 gauge trocar needle was advanced through the skin, and soft tissues and into the fluid collection. Care was taken to pass the needle through the sacrospinous ligament and medial to the gluteal vessels.  A wire was then advanced into the pelvic fluid collection.  The skin track was then serially dilated to 12-French, and ultimately a 12-French Cook multipurpose drainage catheter  was advanced over a wire and positioned within the fluid collection.  A total of 230 ml of frankly purulent, foul- smelling material was then successfully aspirated.  A sample was sent to microbiology for Gram stain and culture.  The catheter was then secured to the skin with 0- Prolene suture and a soft plastic bumper. An adhesive retention device was also used. The catheter was connected to a bulb suction device.  The patient tolerated the procedure very well, there was no immediate complication.  IMPRESSION:  Successful placement of a 12 French drainage catheter in the deep pelvic abscess.  A total of 240 ml of frankly purulent, foul- smelling fluid was successfully aspirated.  A sample was sent for Gram stain and culture.  Signed,  Sterling Big, MD Vascular & Interventional Radiologist [facility] Radiology   Original Report Authenticated By: Alvino Blood Chest Port 1 View  01/13/2012  *RADIOLOGY REPORT*  Clinical Data: PICC line placement.  PORTABLE CHEST - 1 VIEW  Comparison: January 10, 2012.  Findings: Cardiomediastinal silhouette appears normal.  Right-sided PICC line is noted with tip in the expected position of the SVC. Minimal subsegmental atelectasis is noted in the right lung base. Left lung is clear.  IMPRESSION: Right-sided PICC line is noted with tip in expected position of the SVC.   Original Report Authenticated By: Venita Sheffield., M.D.     Medications:  Scheduled:   . enoxaparin (LOVENOX) injection  40 mg Subcutaneous Q24H  . feeding supplement  30 mL Oral BID WC  . feeding supplement  1 Container Oral BID BM  . magnesium sulfate  2 g Intravenous Once  . metronidazole  500 mg Intravenous Q8H  . piperacillin-tazobactam (ZOSYN)  IV  3.375 g Intravenous Q8H  . potassium chloride  40 mEq Oral Q3H  . potassium phosphate IVPB (mmol)  20 mmol Intravenous Once  . potassium phosphate IVPB (mmol)  20 mmol Intravenous Once  . sodium chloride  10-40 mL Intracatheter Q12H  .  sodium chloride  3 mL Intravenous Q12H  . vancomycin  1,250 mg Intravenous Q12H  . DISCONTD: magnesium sulfate 1 - 4 g bolus IVPB  2 g Intravenous Once   Continuous:   . 0.9 % NaCl with KCl 20 mEq / L 50 mL/hr at 01/14/12 0600   VWU:[id number], acetaminophen, albuterol, ALPRAZolam, HYDROmorphone (DILAUDID) injection, ondansetron (ZOFRAN) IV, ondansetron, sodium chloride  Assessment: Principal Problem:  *Intra-abdominal abscess post-procedure Active Problems:  Hypokalemia  Dehydration  Diarrhea  Malnutrition, calorie  Iron deficiency anemia  Left arm swelling  Thrombocytosis  Hypophosphatemia  Hypomagnesemia  Peripheral edema    1. Intra-abdominal abscess in the setting of recent bowel surgery. One of the abscess cultures is growing out Escherichia coli, sensitive to Zosyn. The other culture result is pending. Would favor continuing Zosyn although antibiotic could be narrowed. Her white blood cell count is still greater than 20,000. Therefore, we'll continue Zosyn, vancomycin, and Flagyl. Her urine culture is negative. Blood cultures are negative to date.  Diarrhea. Resolving. C. difficile PCR negative. Would favor changing Flagyl to oral and possibly discontinuing it in several days.  Left arm swelling. Final results of the venous ultrasound are pending. The preliminary report was that the patient had a superficial thrombus. This does not traditionally need full dose anticoagulation. We'll continue prophylactic Lovenox.  Generalized peripheral edema. This is likely secondary to volume repletion in addition to IV fluids given with antibiotics and electrolyte supplementation over the past several days. Her ins and outs are positive almost 5 L.  Severe malnutrition. Her albumin is 1.7. This may also be contributing to generalized edema. The patient has been evaluated by the registered dietitian. We'll continue liquid supplement.  Iron deficiency anemia. Her hemoglobin increased  nearly a gram with no transfusion. In addition to iron deficiency anemia, her anemia is also likely secondary to acute infection. We'll hold off on iron supplementation until infection is clinically resolving, but will add a multivitamin with iron.  Electrolyte derangements including hypokalemia, hypomagnesemia, and hypophosphatemia. Status post oral potassium. The patient's potassium level has improved significantly. Status post IV magnesium sulfate. The patient's magnesium level is still borderline low but improved. Status post IV phosphorus. The patient's phosphorus level has improved but is still low.   Plan: 1. We'll start oral phosphorus supplementation. 2. We'll change Flagyl to by mouth. We'll consider discontinuing it in several days. 3. Out of bed to the chair today. We'll order physical therapy consultation. 4. We'll give gentle Lasix for treatment of peripheral edema. Will KVO the IV fluids. 5. We'll check the final results of the left upper extremity venous ultrasound. Continue prophylactic Lovenox for now.   LOS: 4 days   Yvette Branch 01/14/2012, 8:30 AM

## 2012-01-14 NOTE — Progress Notes (Signed)
Nutrition Follow-up  Intervention:   -Resource Breeze po BID, each supplement provides 250 kcal and 9 grams of protein. -ProStat 30 ml po BID provides 100 kcal, 15 gr protein each  Assessment:   Pt diet now sucessfully advanced. She's tolerating oral intake with po's 0-15% today which is inadequate to meet her estimated nutritional needs.  Diet Order: Regular  Meds: Scheduled Meds:   . enoxaparin (LOVENOX) injection  40 mg Subcutaneous Q24H  . feeding supplement  30 mL Oral BID WC  . feeding supplement  1 Container Oral BID BM  . furosemide  20 mg Oral BID  . metroNIDAZOLE  500 mg Oral Q8H  . phosphorus  250 mg Oral BID  . piperacillin-tazobactam (ZOSYN)  IV  3.375 g Intravenous Q8H  . potassium phosphate IVPB (mmol)  20 mmol Intravenous Once  . potassium phosphate IVPB (mmol)  20 mmol Intravenous Once  . sodium chloride  10-40 mL Intracatheter Q12H  . sodium chloride  3 mL Intravenous Q12H  . vancomycin  1,250 mg Intravenous Q12H  . DISCONTD: metronidazole  500 mg Intravenous Q8H   Continuous Infusions:   . 0.9 % NaCl with KCl 20 mEq / L 20 mL/hr at 01/14/12 1300   PRN Meds:.acetaminophen, acetaminophen, albuterol, ALPRAZolam, HYDROmorphone (DILAUDID) injection, ondansetron (ZOFRAN) IV, ondansetron, sodium chloride  Labs:  CMP     Component Value Date/Time   NA 135 01/14/2012 0446   K 4.2 01/14/2012 0446   CL 102 01/14/2012 0446   CO2 28 01/14/2012 0446   GLUCOSE 116* 01/14/2012 0446   BUN 3* 01/14/2012 0446   CREATININE 0.48* 01/14/2012 0446   CALCIUM 7.3* 01/14/2012 0446   PROT 5.1* 01/11/2012 0426   ALBUMIN 1.7* 01/11/2012 0426   AST 21 01/11/2012 0426   ALT 16 01/11/2012 0426   ALKPHOS 113 01/11/2012 0426   BILITOT 0.3 01/11/2012 0426   GFRNONAA >90 01/14/2012 0446   GFRAA >90 01/14/2012 0446     Intake/Output Summary (Last 24 hours) at 01/14/12 1412 Last data filed at 01/14/12 1300  Gross per 24 hour  Intake   2970 ml  Output   2270 ml  Net    700 ml    Weight  Status:  133.13# (60.7 kg) reflects severe wt gain of 14# (6 kg) r/t edema?  Estimated needs: Kcal:1650-1890 kcal ,Protein:80-90 gr ,Fluid:1 ml/kcal  NUTRITION DIAGNOSIS:  -Inadequate oral intake (NI-2.1). Status: Ongoing    MONITORING/EVALUATION(Goals):  Monitor diet tolerance, % po intake, labs, weight status  Goal: Pt to meet >/= 90% of their estimated nutrition needs; not met   Royann Shivers MS,RD,CSG,LDN Office: [phone] Pager: [phone]

## 2012-01-14 NOTE — Progress Notes (Signed)
Pt transferred to room 212. Report called to [facility] on 200. Pt is alert and oriented. Midline incision line is intact. No visible drainage or redness. drsg to lt buttock is clean dry and intact. JP charged and draining small amt thin yellow fluid.Yvette Branch and SCD"s in place Dual lumen PICC in rt upper arm patent. Denies any pain. Transferred via w/c w/o any difficulty.

## 2012-01-14 NOTE — Progress Notes (Signed)
Subjective: No significant complaints, feels better. Diarrhea seems to be resolving. It is tolerating by mouth well.  Objective: Vital signs in last 24 hours: Temp:  [97.4 F (36.3 C)-98.2 F (36.8 C)] 98.2 F (36.8 C) (10/04 0800) Pulse Rate:  [81-96] 87  (10/04 1100) Resp:  [12-22] 20  (10/04 1100) BP: (95-130)/(49-70) 115/57 mmHg (10/04 0900) SpO2:  [92 %-99 %] 96 % (10/04 1100) Weight:  [60.7 kg (133 lb 13.1 oz)] 60.7 kg (133 lb 13.1 oz) (10/04 0500) Last BM Date: 01/13/12  Intake/Output from previous day: 10/03 0701 - 10/04 0700 In: 2686.7 [I.V.:1180; IV Piggyback:1506.7] Out: 1740 [Urine:1700; Drains:40] Intake/Output this shift: Total I/O In: 940 [P.O.:360; I.V.:80; IV Piggyback:500] Out: 500 [Urine:500]  General appearance: alert and cooperative GI: soft, non-tender; bowel sounds normal; no masses,  no organomegaly  Lab Results:   [facility] 01/14/12 0446 01/13/12 0419  WBC 22.2* 21.8*  HGB 8.8* 7.9*  HCT 25.6* 22.9*  PLT 733* 578*   BMET  Basename 01/14/12 0446 01/13/12 0419  NA 135 135  K 4.2 2.8*  CL 102 101  CO2 28 26  GLUCOSE 116* 100*  BUN 3* 4*  CREATININE 0.48* 0.43*  CALCIUM 7.3* 6.5*   PT/INR  Basename 01/11/12 1400  LABPROT 13.9  INR 1.08    Studies/Results: Ct Guided Abscess Drain  01/12/2012  *RADIOLOGY REPORT*  CT GUIDED ABSCESS DRAIN  Date: 01/12/2012  Clinical History: 66 year old female with loculated fluid collection concerning for abscess in the rectouterine recess of [place].  She is 12 days postop laparotomy for small bowel obstruction.  Procedures Performed: 1. CT guided placement of a 12 French peritoneal abscess drain  Interventional Radiologist:  Sterling Big, MD  Sedation: Moderate (conscious) sedation was used.  3 mg Versed, 200 mcg Fentanyl were administered intravenously.  The patient's vital signs were monitored continuously by radiology nursing throughout the procedure.  Sedation Time: 25 minutes   PROCEDURE/FINDINGS:   Informed consent was obtained from the patient following explanation of the procedure, risks, benefits and alternatives. The patient understands, agrees and consents for the procedure. All questions were addressed. A time out was performed.  Maximal barrier sterile technique utilized including caps, mask, sterile gowns, sterile gloves, large sterile drape, hand hygiene, and betadine skin prep.  A planning axial CT scan was performed.  The loculated fluid collection in the rectouterine recess of [place] was identified. An appropriate skin entry site was selected and marked.  Local anesthesia was achieved with infiltration of 1% lidocaine.  Under CT fluoroscopic guidance, an 18 gauge trocar needle was advanced through the skin, and soft tissues and into the fluid collection. Care was taken to pass the needle through the sacrospinous ligament and medial to the gluteal vessels.  A wire was then advanced into the pelvic fluid collection.  The skin track was then serially dilated to 12-French, and ultimately a 12-French Cook multipurpose drainage catheter was advanced over a wire and positioned within the fluid collection.  A total of 230 ml of frankly purulent, foul- smelling material was then successfully aspirated.  A sample was sent to microbiology for Gram stain and culture.  The catheter was then secured to the skin with 0- Prolene suture and a soft plastic bumper. An adhesive retention device was also used. The catheter was connected to a bulb suction device.  The patient tolerated the procedure very well, there was no immediate complication.  IMPRESSION:  Successful placement of a 12 French drainage catheter in the deep pelvic abscess.  A total of 240 ml of frankly purulent, foul- smelling fluid was successfully aspirated.  A sample was sent for Gram stain and culture.  Signed,  Sterling Big, MD Vascular & Interventional Radiologist [facility]iology   Original Report Authenticated  By: Alvino Blood Chest Port 1 View  01/13/2012  *RADIOLOGY REPORT*  Clinical Data: PICC line placement.  PORTABLE CHEST - 1 VIEW  Comparison: January 10, 2012.  Findings: Cardiomediastinal silhouette appears normal.  Right-sided PICC line is noted with tip in the expected position of the SVC. Minimal subsegmental atelectasis is noted in the right lung base. Left lung is clear.  IMPRESSION: Right-sided PICC line is noted with tip in expected position of the SVC.   Original Report Authenticated By: Venita Sheffield., M.D.     Anti-infectives: Anti-infectives     Start     Dose/Rate Route Frequency Ordered Stop   01/14/12 0900   metroNIDAZOLE (FLAGYL) tablet 500 mg        500 mg Oral 3 times per day 01/14/12 0850     01/12/12 1500   vancomycin (VANCOCIN) 1,250 mg in sodium chloride 0.9 % 250 mL IVPB        1,250 mg 166.7 mL/hr over 90 Minutes Intravenous Every 12 hours 01/12/12 1457     01/11/12 1500   vancomycin (VANCOCIN) 750 mg in sodium chloride 0.9 % 150 mL IVPB  Status:  Discontinued        750 mg 150 mL/hr over 60 Minutes Intravenous Every 12 hours 01/11/12 0358 01/12/12 1457   01/11/12 0900   metroNIDAZOLE (FLAGYL) IVPB 500 mg  Status:  Discontinued        500 mg 100 mL/hr over 60 Minutes Intravenous Every 8 hours 01/11/12 0820 01/14/12 0850   01/11/12 0500  piperacillin-tazobactam (ZOSYN) IVPB 3.375 g       3.375 g 12.5 mL/hr over 240 Minutes Intravenous Every 8 hours 01/11/12 0358     01/11/12 0130   vancomycin (VANCOCIN) IVPB 1000 mg/200 mL premix  Status:  Discontinued        1,000 mg 200 mL/hr over 60 Minutes Intravenous  Once 01/11/12 0121 01/11/12 0337   01/11/12 0130   piperacillin-tazobactam (ZOSYN) IVPB 3.375 g  Status:  Discontinued        3.375 g 12.5 mL/hr over 240 Minutes Intravenous  Once 01/11/12 0121 01/11/12 0454          Assessment/Plan: Impression: Intra-abdominal abscess, status post exploratory laparotomy with lysis of adhesions another facility.  Initial cultures revealed Escherichia coli. Would try to narrow antibiotic selection once leukocytosis starts trending downward. Agree with transfer from ICU. Will continue diet. Will continue to follow closely with you. No enteric contents are noted within the drain. Therefore, doubt ongoing perforation of bowel.  LOS: 4 days    Delio Slates A 01/14/2012

## 2012-01-14 NOTE — Care Management Note (Signed)
    Page 1 of 2   01/18/2012     11:43:37 AM   CARE MANAGEMENT NOTE 01/18/2012  Patient:  Yvette Branch, Yvette Branch   Account Number:  [id number]  Date Initiated:  01/14/2012  Documentation initiated by:  Rosemary Holms  Subjective/Objective Assessment:   Pt admitted from home where she lives alone. Pt states she has very little assistance. She is active with [facility] [place] and will resume this.     Action/Plan:   Pt states she is weak and may need HH PT. Also is thinking DME such as a 3 in 1 and tub bench.   Anticipated DC Date:  01/18/2012   Anticipated DC Plan:  HOME W HOME HEALTH SERVICES      DC Planning Services  CM consult      Choice offered to / List presented to:  C-1 Patient   DME arranged  3-N-1  Ronita Hipps      DME agency  [facility]     [facility] arranged  HH-1 RN  HH-2 PT      [facility] agency  [facility]   Status of service:  Completed, signed off Medicare Important Message given?  YES (If response is "NO", the following Medicare IM given date fields will be blank) Date Medicare IM given:  01/18/2012 Date Additional Medicare IM given:    Discharge Disposition:  HOME W HOME HEALTH SERVICES  Per UR Regulation:    If discussed at Long Length of Stay Meetings, dates discussed:   01/18/2012    Comments:  01/18/12 1100 Mahala Rommel RN BSN CM PICC to be removed and JP drain will remain in. [facility] notified. Abby for DME notified to ship 3 in 1 [facility] and shower bench to pt's home.  01/17/12 1100 Rozina Pointer RN BSN CM Spoke to pt today. Feeling much better and anticipating DC tomorrow. [facility] set up for RN and PT., DME needs requested are 3 in 1 [facility] and a shower stool/tub bench.  01/14/12 1100 Vesper Trant Leanord Hawking RN BSN CM

## 2012-01-15 DIAGNOSIS — D473 Essential (hemorrhagic) thrombocythemia: Secondary | ICD-10-CM

## 2012-01-15 LAB — CBC WITH DIFFERENTIAL/PLATELET
Eosinophils Relative: 1 % (ref 0–5)
Lymphs Abs: 2.1 10*3/uL (ref 0.7–4.0)
MCH: 28.3 pg (ref 26.0–34.0)
MCV: 83.7 fL (ref 78.0–100.0)
Monocytes Absolute: 1.6 10*3/uL — ABNORMAL HIGH (ref 0.1–1.0)
Platelets: 740 10*3/uL — ABNORMAL HIGH (ref 150–400)
RBC: 3 MIL/uL — ABNORMAL LOW (ref 3.87–5.11)
RDW: 13.6 % (ref 11.5–15.5)

## 2012-01-15 LAB — HEPATIC FUNCTION PANEL
AST: 17 U/L (ref 0–37)
Albumin: 1.6 g/dL — ABNORMAL LOW (ref 3.5–5.2)
Alkaline Phosphatase: 82 U/L (ref 39–117)
Total Bilirubin: 0.2 mg/dL — ABNORMAL LOW (ref 0.3–1.2)

## 2012-01-15 LAB — BASIC METABOLIC PANEL
BUN: 3 mg/dL — ABNORMAL LOW (ref 6–23)
CO2: 32 mEq/L (ref 19–32)
Chloride: 101 mEq/L (ref 96–112)
Glucose, Bld: 101 mg/dL — ABNORMAL HIGH (ref 70–99)
Potassium: 3.7 mEq/L (ref 3.5–5.1)

## 2012-01-15 LAB — FECAL LACTOFERRIN, QUANT

## 2012-01-15 MED ORDER — LOPERAMIDE HCL 2 MG PO CAPS
2.0000 mg | ORAL_CAPSULE | ORAL | Status: DC | PRN
  Administered 2012-01-15: 2 mg via ORAL
  Filled 2012-01-15: qty 1

## 2012-01-15 MED ORDER — GLUCERNA SHAKE PO LIQD
237.0000 mL | Freq: Three times a day (TID) | ORAL | Status: DC
  Administered 2012-01-15 – 2012-01-18 (×9): 237 mL via ORAL

## 2012-01-15 MED ORDER — POTASSIUM CHLORIDE CRYS ER 20 MEQ PO TBCR
20.0000 meq | EXTENDED_RELEASE_TABLET | Freq: Two times a day (BID) | ORAL | Status: AC
  Administered 2012-01-15 (×2): 20 meq via ORAL
  Filled 2012-01-15 (×2): qty 1

## 2012-01-15 MED ORDER — DEXTROSE 5 % IV SOLN
1.0000 g | INTRAVENOUS | Status: DC
  Administered 2012-01-15 – 2012-01-18 (×4): 1 g via INTRAVENOUS
  Filled 2012-01-15 (×5): qty 10

## 2012-01-15 MED ORDER — FUROSEMIDE 20 MG PO TABS: 20.0000 mg | ORAL_TABLET | Freq: Every day | ORAL | Status: DC

## 2012-01-15 NOTE — Progress Notes (Signed)
Subjective: Continues to improve. She did have lower abdominal pain yesterday evening after drinking a protein supplement.  Objective: Vital signs in last 24 hours: Temp:  [97.4 F (36.3 C)-97.9 F (36.6 C)] 97.4 F (36.3 C) (10/05 0500) Pulse Rate:  [72-93] 93  (10/05 0500) Resp:  [14-20] 18  (10/05 0500) BP: (115-116)/(53-57) 115/53 mmHg (10/05 0500) SpO2:  [96 %-99 %] 98 % (10/05 0500) Last BM Date: 01/14/12  Intake/Output from previous day: 10/04 0701 - 10/05 0700 In: 3082 [P.O.:1440; I.V.:492; IV Piggyback:1150] Out: 3330 [Urine:3300; Drains:30] Intake/Output this shift:    General appearance: alert, cooperative and no distress GI: soft, non-tender; bowel sounds normal; no masses,  no organomegaly Incision healing well.  Lab Results:   Basename 01/15/12 0715 01/14/12 0446  WBC 16.0* 22.2*  HGB 8.5* 8.8*  HCT 25.1* 25.6*  PLT 740* 733*   BMET  Basename 01/15/12 0715 01/14/12 0446  NA 138 135  K 3.7 4.2  CL 101 102  CO2 32 28  GLUCOSE 101* 116*  BUN 3* 3*  CREATININE 0.51 0.48*  CALCIUM 7.8* 7.3*   PT/INR No results found for this basename: LABPROT:2,INR:2 in the last 72 hours  Studies/Results: US Venous Img Upper Uni Left  01/14/2012  *RADIOLOGY REPORT*  Clinical Data:  Left arm pain and edema, evaluate for DVT  LEFT UPPER EXTREMITY VENOUS DUPLEX ULTRASOUND  Technique:  Gray-scale sonography with graded compression, as well as color Doppler and duplex ultrasound were performed to evaluate the upper extremity deep venous system from the level of the subclavian vein and including the jugular, axillary, basilic and upper cephalic vein.  Spectral Doppler was utilized to evaluate flow at rest and with distal augmentation maneuvers.  Comparison:  None.  Findings:  Normal compressibility of the upper extremity deep veins is demonstrated.  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  No venous filling defects visualized on grayscale or color  Doppler US.  Normal direction of flow is seen throughout the deep veins.  Spectral Doppler waveforms show normal morphology at rest and with distal augmentation.  The cephalic vein at the level of the wrist is expanded with hypoechoic thrombus and is noncompressible (images 31 and 32).  The cephalic vein appears normally patent at the level of the elbow, however there is partially occlusive thrombus within the cephalic vein at the level of the shoulder (image 36 and 37).  IMPRESSION: 1.  No evidence of deep venous thrombosis within the left upper extremity. 2.   Examination is positive for superficial venous thrombosis within the cephalic vein.   Original Report Authenticated By: Waynard Reeds, M.D.    Dg Chest Port 1 View  01/13/2012  *RADIOLOGY REPORT*  Clinical Data: PICC line placement.  PORTABLE CHEST - 1 VIEW  Comparison: January 10, 2012.  Findings: Cardiomediastinal silhouette appears normal.  Right-sided PICC line is noted with tip in the expected position of the SVC. Minimal subsegmental atelectasis is noted in the right lung base. Left lung is clear.  IMPRESSION: Right-sided PICC line is noted with tip in expected position of the SVC.   Original Report Authenticated By: Venita Sheffield., M.D.     Anti-infectives: Anti-infectives     Start     Dose/Rate Route Frequency Ordered Stop   01/15/12 1000   cefTRIAXone (ROCEPHIN) 1 g in dextrose 5 % 50 mL IVPB        1 g 100 mL/hr over 30 Minutes Intravenous Every 24 hours 01/15/12  1610     01/14/12 0900   metroNIDAZOLE (FLAGYL) tablet 500 mg  Status:  Discontinued        500 mg Oral 3 times per day 01/14/12 0850 01/15/12 0917   01/12/12 1500   vancomycin (VANCOCIN) 1,250 mg in sodium chloride 0.9 % 250 mL IVPB        1,250 mg 166.7 mL/hr over 90 Minutes Intravenous Every 12 hours 01/12/12 1457     01/11/12 1500   vancomycin (VANCOCIN) 750 mg in sodium chloride 0.9 % 150 mL IVPB  Status:  Discontinued        750 mg 150 mL/hr over 60  Minutes Intravenous Every 12 hours 01/11/12 0358 01/12/12 1457   01/11/12 0900   metroNIDAZOLE (FLAGYL) IVPB 500 mg  Status:  Discontinued        500 mg 100 mL/hr over 60 Minutes Intravenous Every 8 hours 01/11/12 0820 01/14/12 0850   01/11/12 0500   piperacillin-tazobactam (ZOSYN) IVPB 3.375 g  Status:  Discontinued        3.375 g 12.5 mL/hr over 240 Minutes Intravenous Every 8 hours 01/11/12 0358 01/15/12 0917   01/11/12 0130   vancomycin (VANCOCIN) IVPB 1000 mg/200 mL premix  Status:  Discontinued        1,000 mg 200 mL/hr over 60 Minutes Intravenous  Once 01/11/12 0121 01/11/12 0337   01/11/12 0130   piperacillin-tazobactam (ZOSYN) IVPB 3.375 g  Status:  Discontinued        3.375 g 12.5 mL/hr over 240 Minutes Intravenous  Once 01/11/12 0121 01/11/12 9604          Assessment/Plan: Impression: Status post drainage of pelvic abscess. Leukocytosis is resolving. Agree with narrowing antibiotic coverage. Will decide in a.m. whether a repeat CT scan the abdomen is warranted.  LOS: 5 days    Belvia Gotschall A 01/15/2012

## 2012-01-15 NOTE — Progress Notes (Addendum)
Subjective: The patient complains of diarrhea. She says that she had 7 or 8 loose bowel movements yesterday. She was given Imodium once. It did help to decrease the stool output. She had far less stools overnight. She believes that the nutritional supplement is causing some of her diarrhea. She denies worsening abdominal pain. She denies nausea vomiting, but she has a poor appetite.  Objective: Vital signs in last 24 hours: Filed Vitals:   01/14/12 1300 01/14/12 1925 01/14/12 2300 01/15/12 0500  BP:   116/57 115/53  Pulse: 89  72 93  Temp:   97.9 F (36.6 C) 97.4 F (36.3 C)  TempSrc:   Oral Oral  Resp: 18  20 18   Height:      Weight:      SpO2: 97% 98% 99% 98%    Intake/Output Summary (Last 24 hours) at 01/15/12 1610 Last data filed at 01/15/12 0636  Gross per 24 hour  Intake   2682 ml  Output   3030 ml  Net   -348 ml    Weight change:   Physical exam: General: Pleasant alert 66 year old Caucasian woman sitting up in bed, in no acute distress. Lungs: Decreased breath sounds in the bases and rare wheeze, no respiratory distress. Heart: S1, S2, with no murmurs rubs or gallops. Abdomen: Healing vertical scar. Left flank drain in place, draining a tan cloudy fluid. Hypoactive bowel sounds, mildly tender over the left flank. Extremities: 1+ left upper extremity edema. 1+ bilateral lower extremity edema. PICC line in place in the right upper extremity. Pedal pulses palpable. Neurologic: She is alert and oriented x3. Cranial nerves II through XII are intact.  Lab Results: Basic Metabolic Panel:  Basename 01/15/12 0715 01/14/12 0446 01/13/12 0419  NA 138 135 --  K 3.7 4.2 --  CL 101 102 --  CO2 32 28 --  GLUCOSE 101* 116* --  BUN 3* 3* --  CREATININE 0.51 0.48* --  CALCIUM 7.8* 7.3* --  MG -- -- 1.6  PHOS 4.5 2.0* --   Liver Function Tests:  Basename 01/15/12 0715  AST 17  ALT 10  ALKPHOS 82  BILITOT 0.2*  PROT 4.8*  ALBUMIN 1.6*   No results found for this  basename: LIPASE:2,AMYLASE:2 in the last 72 hours No results found for this basename: AMMONIA:2 in the last 72 hours CBC:  Basename 01/15/12 0715 01/14/12 0446  WBC 16.0* 22.2*  NEUTROABS 11.9* --  HGB 8.5* 8.8*  HCT 25.1* 25.6*  MCV 83.7 83.1  PLT 740* 733*   Cardiac Enzymes: No results found for this basename: CKTOTAL:3,CKMB:3,CKMBINDEX:3,TROPONINI:3 in the last 72 hours BNP: No results found for this basename: PROBNP:3 in the last 72 hours D-Dimer: No results found for this basename: DDIMER:2 in the last 72 hours CBG: No results found for this basename: GLUCAP:6 in the last 72 hours Hemoglobin A1C: No results found for this basename: HGBA1C in the last 72 hours Fasting Lipid Panel: No results found for this basename: CHOL,HDL,LDLCALC,TRIG,CHOLHDL,LDLDIRECT in the last 72 hours Thyroid Function Tests: No results found for this basename: TSH,T4TOTAL,FREET4,T3FREE,THYROIDAB in the last 72 hours Anemia Panel: No results found for this basename: VITAMINB12,FOLATE,FERRITIN,TIBC,IRON,RETICCTPCT in the last 72 hours Coagulation: No results found for this basename: LABPROT:2,INR:2 in the last 72 hours Urine Drug Screen: Drugs of Abuse  No results found for this basename: labopia,  cocainscrnur,  labbenz,  amphetmu,  thcu,  labbarb    Alcohol Level: No results found for this basename: ETH:2 in the last 72 hours Urinalysis:  No results found for this basename: COLORURINE:2,APPERANCEUR:2,LABSPEC:2,PHURINE:2,GLUCOSEU:2,HGBUR:2,BILIRUBINUR:2,KETONESUR:2,PROTEINUR:2,UROBILINOGEN:2,NITRITE:2,LEUKOCYTESUR:2 in the last 72 hours Misc. Labs:   Micro: Recent Results (from the past 240 hour(s))  URINE CULTURE     Status: Normal   Collection Time   01/10/12 10:38 PM      Component Value Range Status Comment   Specimen Description URINE, CLEAN CATCH   Final    Special Requests NONE   Final    Culture  Setup Time 01/11/2012 21:42   Final    Colony Count NO GROWTH   Final    Culture NO  GROWTH   Final    Report Status 01/12/2012 FINAL   Final   CULTURE, BLOOD (ROUTINE X 2)     Status: Normal (Preliminary result)   Collection Time   01/11/12  1:11 AM      Component Value Range Status Comment   Specimen Description BLOOD RIGHT ARM   Final    Special Requests BOTTLES DRAWN AEROBIC AND ANAEROBIC 6CC   Final    Culture NO GROWTH 3 DAYS   Final    Report Status PENDING   Incomplete   CULTURE, BLOOD (ROUTINE X 2)     Status: Normal (Preliminary result)   Collection Time   01/11/12  1:12 AM      Component Value Range Status Comment   Specimen Description BLOOD LEFT HAND   Final    Special Requests BOTTLES DRAWN AEROBIC AND ANAEROBIC 6CC   Final    Culture NO GROWTH 3 DAYS   Final    Report Status PENDING   Incomplete   MRSA PCR SCREENING     Status: Normal   Collection Time   01/11/12  3:33 AM      Component Value Range Status Comment   MRSA by PCR NEGATIVE  NEGATIVE Final   CULTURE, ROUTINE-ABSCESS     Status: Normal   Collection Time   01/12/12 11:19 AM      Component Value Range Status Comment   Specimen Description PERITONEAL CAVITY   Final    Special Requests Normal   Final    Gram Stain     Final    Value: ABUNDANT WBC PRESENT, PREDOMINANTLY PMN     NO SQUAMOUS EPITHELIAL CELLS SEEN     ABUNDANT GRAM NEGATIVE RODS     ABUNDANT GRAM POSITIVE COCCI IN PAIRS     IN CHAINS IN CLUSTERS   Culture MODERATE ESCHERICHIA COLI   Final    Report Status 01/14/2012 FINAL   Final    Organism ID, Bacteria ESCHERICHIA COLI   Final   ANAEROBIC CULTURE     Status: Normal (Preliminary result)   Collection Time   01/12/12 11:19 AM      Component Value Range Status Comment   Specimen Description PERITONEAL CAVITY   Final    Special Requests Normal   Final    Gram Stain     Final    Value: ABUNDANT WBC PRESENT, PREDOMINANTLY PMN     NO SQUAMOUS EPITHELIAL CELLS SEEN     ABUNDANT GRAM NEGATIVE RODS     ABUNDANT GRAM POSITIVE COCCI IN PAIRS     IN CLUSTERS IN CHAINS   Culture      Final    Value: NO ANAEROBES ISOLATED; CULTURE IN PROGRESS FOR 5 DAYS   Report Status PENDING   Incomplete   CLOSTRIDIUM DIFFICILE BY PCR     Status: Normal   Collection Time   01/13/12  9:51 PM  Component Value Range Status Comment   C difficile by pcr NEGATIVE  NEGATIVE Final   CLOSTRIDIUM DIFFICILE BY PCR     Status: Normal   Collection Time   01/14/12  5:15 PM      Component Value Range Status Comment   C difficile by pcr NEGATIVE  NEGATIVE Final     Studies/Results: US Venous Img Upper Uni Left  01/14/2012  *RADIOLOGY REPORT*  Clinical Data:  Left arm pain and edema, evaluate for DVT  LEFT UPPER EXTREMITY VENOUS DUPLEX ULTRASOUND  Technique:  Gray-scale sonography with graded compression, as well as color Doppler and duplex ultrasound were performed to evaluate the upper extremity deep venous system from the level of the subclavian vein and including the jugular, axillary, basilic and upper cephalic vein.  Spectral Doppler was utilized to evaluate flow at rest and with distal augmentation maneuvers.  Comparison:  None.  Findings:  Normal compressibility of the upper extremity deep veins is demonstrated.  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  No venous filling defects visualized on grayscale or color Doppler US.  Normal direction of flow is seen throughout the deep veins.  Spectral Doppler waveforms show normal morphology at rest and with distal augmentation.  The cephalic vein at the level of the wrist is expanded with hypoechoic thrombus and is noncompressible (images 31 and 32).  The cephalic vein appears normally patent at the level of the elbow, however there is partially occlusive thrombus within the cephalic vein at the level of the shoulder (image 36 and 37).  IMPRESSION: 1.  No evidence of deep venous thrombosis within the left upper extremity. 2.   Examination is positive for superficial venous thrombosis within the cephalic vein.   Original Report  Authenticated By: Waynard Reeds, M.D.    Dg Chest Port 1 View  01/13/2012  *RADIOLOGY REPORT*  Clinical Data: PICC line placement.  PORTABLE CHEST - 1 VIEW  Comparison: January 10, 2012.  Findings: Cardiomediastinal silhouette appears normal.  Right-sided PICC line is noted with tip in the expected position of the SVC. Minimal subsegmental atelectasis is noted in the right lung base. Left lung is clear.  IMPRESSION: Right-sided PICC line is noted with tip in expected position of the SVC.   Original Report Authenticated By: Venita Sheffield., M.D.     Medications:  Scheduled:    . enoxaparin (LOVENOX) injection  40 mg Subcutaneous Q24H  . feeding supplement  1 Container Oral BID BM  . furosemide  20 mg Oral BID  . loperamide  4 mg Oral Once  . metroNIDAZOLE  500 mg Oral Q8H  . phosphorus  250 mg Oral BID  . piperacillin-tazobactam (ZOSYN)  IV  3.375 g Intravenous Q8H  . potassium phosphate IVPB (mmol)  20 mmol Intravenous Once  . sodium chloride  10-40 mL Intracatheter Q12H  . sodium chloride  3 mL Intravenous Q12H  . vancomycin  1,250 mg Intravenous Q12H  . DISCONTD: feeding supplement  30 mL Oral BID WC   Continuous:    . 0.9 % NaCl with KCl 20 mEq / L 20 mL/hr at 01/14/12 1300   RUE:[id number], acetaminophen, albuterol, ALPRAZolam, HYDROmorphone (DILAUDID) injection, ondansetron (ZOFRAN) IV, ondansetron, sodium chloride, DISCONTD: ondansetron (ZOFRAN) IV, DISCONTD: ondansetron  Assessment: Principal Problem:  *Intra-abdominal abscess post-procedure Active Problems:  Hypokalemia  Dehydration  Diarrhea  Malnutrition, calorie  Iron deficiency anemia  Left arm swelling  Thrombocytosis  Hypophosphatemia  Hypomagnesemia  Peripheral edema  1. Intra-abdominal abscess in the setting of recent bowel surgery. One of the abscess cultures is growing out Escherichia coli, sensitive to Zosyn. The other culture result is pending. Her white blood cell count is decreasing.  She continues on Zosyn, vancomycin, and Flagyl, however, antibiotic therapy will be narrowed today.  Her urine culture is negative. Blood cultures are negative to date.  Diarrhea. Resolving. C. difficile PCR negative x2. She is on Flagyl empirically. The etiology of her diarrhea is unknown at this time, but will consider nutritional supplements and/or electrolyte repletion as a potential cause.  Left arm swelling. Ultrasound notes a superficial thrombus, but no DVT. This does not require full dose anticoagulation. We'll continue prophylactic Lovenox.  Generalized peripheral edema. This is likely secondary to volume repletion in addition to IV fluids given with antibiotics and electrolyte supplementation over the past several days. Her ins and outs are positive. Lasix was started. We'll continue gentle Lasix for now.  Severe malnutrition. Her albumin is 1.7. This may also be contributing to generalized edema. The patient has been evaluated by the registered dietitian. Nutritional supplements were started.  Iron deficiency anemia. In addition to iron deficiency anemia, her anemia is also likely secondary to acute infection. We'll hold off on iron supplementation until her infection is clinically resolving.  Electrolyte derangements including hypokalemia, hypomagnesemia, and hypophosphatemia. Status post oral potassium. The patient's potassium level has improved significantly. Status post IV magnesium sulfate. The patient's magnesium level is still borderline low but improved. Status post IV phosphorus and oral K-Phos. Her electrolytes are much improved.   Plan: 1. We'll discontinue K-Phos. 2. Pro stat was discontinued per patient request. We'll discontinue other liquid supplement and encourage the patient to eat more as tolerated. We'll add Glucerna as the patient requests. 3. We'll narrow antibiotic to ceftriaxone and vancomycin. 4. We'll decrease Lasix to once daily. 5. Increase activity as  tolerated. 6. Imodium as needed.   LOS: 5 days   Yvette Branch 01/15/2012, 9:07 AM

## 2012-01-15 NOTE — Progress Notes (Addendum)
MEDICATION RELATED CONSULT NOTE   Pharmacy Consult for Phosphorous replacement  Indication: hypophosphatemia  Allergies  Allergen Reactions  . Hydrocodone Other (See Comments)    REACTION: causes pains in stomach   Patient Measurements: Height: 5\' 2"  (157.5 cm) Weight: 133 lb 13.1 oz (60.7 kg) IBW/kg (Calculated) : 50.1   Vital Signs: Temp: 97.4 F (36.3 C) (10/05 0500) Temp src: Oral (10/05 0500) BP: 115/53 mmHg (10/05 0500) Pulse Rate: 93  (10/05 0500) Intake/Output from previous day: 10/04 0701 - 10/05 0700 In: 3082 [P.O.:1440; I.V.:492; IV Piggyback:1150] Out: 3330 [Urine:3300; Drains:30] Intake/Output from this shift:    Labs:  [facility] 01/15/12 0715 01/14/12 0446 01/13/12 0419  WBC 16.0* 22.2* 21.8*  HGB 8.5* 8.8* 7.9*  HCT 25.1* 25.6* 22.9*  PLT 740* 733* 578*  APTT -- -- --  CREATININE 0.51 0.48* 0.43*  LABCREA -- -- --  CREATININE 0.51 0.48* 0.43*  CREAT24HRUR -- -- --  MG -- -- 1.6  PHOS 4.5 2.0* 1.8*  ALBUMIN 1.6* -- --  PROT 4.8* -- --  ALBUMIN 1.6* -- --  AST 17 -- --  ALT 10 -- --  ALKPHOS 82 -- --  BILITOT 0.2* -- --  BILIDIR 0.1 -- --  IBILI 0.1* -- --   Estimated Creatinine Clearance: 60.1 ml/min (by C-G formula based on Cr of 0.51).  Microbiology: Recent Results (from the past 720 hour(s))  URINE CULTURE     Status: Normal   Collection Time   01/10/12 10:38 PM      Component Value Range Status Comment   Specimen Description URINE, CLEAN CATCH   Final    Special Requests NONE   Final    Culture  Setup Time 01/11/2012 21:42   Final    Colony Count NO GROWTH   Final    Culture NO GROWTH   Final    Report Status 01/12/2012 FINAL   Final   CULTURE, BLOOD (ROUTINE X 2)     Status: Normal (Preliminary result)   Collection Time   01/11/12  1:11 AM      Component Value Range Status Comment   Specimen Description BLOOD RIGHT ARM   Final    Special Requests BOTTLES DRAWN AEROBIC AND ANAEROBIC 6CC   Final    Culture NO GROWTH 3 DAYS   Final     Report Status PENDING   Incomplete   CULTURE, BLOOD (ROUTINE X 2)     Status: Normal (Preliminary result)   Collection Time   01/11/12  1:12 AM      Component Value Range Status Comment   Specimen Description BLOOD LEFT HAND   Final    Special Requests BOTTLES DRAWN AEROBIC AND ANAEROBIC 6CC   Final    Culture NO GROWTH 3 DAYS   Final    Report Status PENDING   Incomplete   MRSA PCR SCREENING     Status: Normal   Collection Time   01/11/12  3:33 AM      Component Value Range Status Comment   MRSA by PCR NEGATIVE  NEGATIVE Final   CULTURE, ROUTINE-ABSCESS     Status: Normal   Collection Time   01/12/12 11:19 AM      Component Value Range Status Comment   Specimen Description PERITONEAL CAVITY   Final    Special Requests Normal   Final    Gram Stain     Final    Value: ABUNDANT WBC PRESENT, PREDOMINANTLY PMN     NO SQUAMOUS EPITHELIAL CELLS  SEEN     ABUNDANT GRAM NEGATIVE RODS     ABUNDANT GRAM POSITIVE COCCI IN PAIRS     IN CHAINS IN CLUSTERS   Culture MODERATE ESCHERICHIA COLI   Final    Report Status 01/14/2012 FINAL   Final    Organism ID, Bacteria ESCHERICHIA COLI   Final   ANAEROBIC CULTURE     Status: Normal (Preliminary result)   Collection Time   01/12/12 11:19 AM      Component Value Range Status Comment   Specimen Description PERITONEAL CAVITY   Final    Special Requests Normal   Final    Gram Stain     Final    Value: ABUNDANT WBC PRESENT, PREDOMINANTLY PMN     NO SQUAMOUS EPITHELIAL CELLS SEEN     ABUNDANT GRAM NEGATIVE RODS     ABUNDANT GRAM POSITIVE COCCI IN PAIRS     IN CLUSTERS IN CHAINS   Culture     Final    Value: NO ANAEROBES ISOLATED; CULTURE IN PROGRESS FOR 5 DAYS   Report Status PENDING   Incomplete   CLOSTRIDIUM DIFFICILE BY PCR     Status: Normal   Collection Time   01/13/12  9:51 PM      Component Value Range Status Comment   C difficile by pcr NEGATIVE  NEGATIVE Final   CLOSTRIDIUM DIFFICILE BY PCR     Status: Normal   Collection Time    01/14/12  5:15 PM      Component Value Range Status Comment   C difficile by pcr NEGATIVE  NEGATIVE Final    Medical History: Past Medical History  Diagnosis Date  . Arthritis   . Osteoporosis    Medications:  Scheduled:     . enoxaparin (LOVENOX) injection  40 mg Subcutaneous Q24H  . feeding supplement  1 Container Oral BID BM  . furosemide  20 mg Oral BID  . loperamide  4 mg Oral Once  . metroNIDAZOLE  500 mg Oral Q8H  . phosphorus  250 mg Oral BID  . piperacillin-tazobactam (ZOSYN)  IV  3.375 g Intravenous Q8H  . potassium phosphate IVPB (mmol)  20 mmol Intravenous Once  . sodium chloride  10-40 mL Intracatheter Q12H  . sodium chloride  3 mL Intravenous Q12H  . vancomycin  1,250 mg Intravenous Q12H  . DISCONTD: feeding supplement  30 mL Oral BID WC   Assessment: 66yo female admitted with abd abscess.   Replenish PO4 with K-Phos. PO4 WLN today  Goal of Therapy:  Phosphorous replacement  Plan:  No additional K-Phos needed at this time Check labs Monday  [place], Meredith Mells Bennett 01/15/2012,9:00 AM

## 2012-01-16 ENCOUNTER — Inpatient Hospital Stay (HOSPITAL_COMMUNITY): Payer: Medicare Other

## 2012-01-16 LAB — CULTURE, BLOOD (ROUTINE X 2)

## 2012-01-16 LAB — CBC
HCT: 25.9 % — ABNORMAL LOW (ref 36.0–46.0)
MCH: 28.4 pg (ref 26.0–34.0)
MCV: 83.5 fL (ref 78.0–100.0)
Platelets: 843 10*3/uL — ABNORMAL HIGH (ref 150–400)
RBC: 3.1 MIL/uL — ABNORMAL LOW (ref 3.87–5.11)

## 2012-01-16 MED ORDER — KCL IN DEXTROSE-NACL 20-5-0.9 MEQ/L-%-% IV SOLN
INTRAVENOUS | Status: DC
  Administered 2012-01-16 – 2012-01-17 (×4): via INTRAVENOUS

## 2012-01-16 NOTE — Progress Notes (Signed)
Subjective: The patient is anxious about going home too soon. She says that her abdomen is hurting "quite a bit" (the patient is sitting up in bed appearing very comfortable). She says her diarrhea has slowed down from yesterday, but she is still having it. She denies nausea or vomiting.  Objective: Vital signs in last 24 hours: Filed Vitals:   01/15/12 0500 01/15/12 1348 01/15/12 2200 01/16/12 0410  BP: 115/53 103/64 117/47 119/74  Pulse: 93 87 91 87  Temp: 97.4 F (36.3 C) 97.6 F (36.4 C) 97.6 F (36.4 C) 98.2 F (36.8 C)  TempSrc: Oral Oral Oral Oral  Resp: 18 18 20 18   Height:      Weight:      SpO2: 98% 95% 98% 94%    Intake/Output Summary (Last 24 hours) at 01/16/12 0835 Last data filed at 01/16/12 0748  Gross per 24 hour  Intake   2270 ml  Output   3591 ml  Net  -1321 ml    Weight change:   Physical exam: General: Pleasant alert 66 year old Caucasian woman sitting up in bed, in no acute distress. Lungs: Decreased breath sounds in the bases, otherwise clear Heart: S1, S2, with no murmurs rubs or gallops. Abdomen: Her abdomen is very soft, and minimally tender over the left slight. Healing vertical scar. Left flank drain in place, draining a scant amount of tan cloudy fluid. Hypoactive bowel sounds. Extremities: Trace left upper extremity edema. Trace to 1+ bilateral lower extremity edema. PICC line in place in the right upper extremity. Pedal pulses palpable. Neurologic: She is alert and oriented x3. Cranial nerves II through XII are intact.  Lab Results: Basic Metabolic Panel:  Basename 01/15/12 0715 01/14/12 0446  NA 138 135  K 3.7 4.2  CL 101 102  CO2 32 28  GLUCOSE 101* 116*  BUN 3* 3*  CREATININE 0.51 0.48*  CALCIUM 7.8* 7.3*  MG -- --  PHOS 4.5 2.0*   Liver Function Tests:  Basename 01/15/12 0715  AST 17  ALT 10  ALKPHOS 82  BILITOT 0.2*  PROT 4.8*  ALBUMIN 1.6*   No results found for this basename: LIPASE:2,AMYLASE:2 in the last 72  hours No results found for this basename: AMMONIA:2 in the last 72 hours CBC:  Basename 01/16/12 0603 01/15/12 0715  WBC 16.5* 16.0*  NEUTROABS -- 11.9*  HGB 8.8* 8.5*  HCT 25.9* 25.1*  MCV 83.5 83.7  PLT 843* 740*   Cardiac Enzymes: No results found for this basename: CKTOTAL:3,CKMB:3,CKMBINDEX:3,TROPONINI:3 in the last 72 hours BNP: No results found for this basename: PROBNP:3 in the last 72 hours D-Dimer: No results found for this basename: DDIMER:2 in the last 72 hours CBG: No results found for this basename: GLUCAP:6 in the last 72 hours Hemoglobin A1C: No results found for this basename: HGBA1C in the last 72 hours Fasting Lipid Panel: No results found for this basename: CHOL,HDL,LDLCALC,TRIG,CHOLHDL,LDLDIRECT in the last 72 hours Thyroid Function Tests: No results found for this basename: TSH,T4TOTAL,FREET4,T3FREE,THYROIDAB in the last 72 hours Anemia Panel: No results found for this basename: VITAMINB12,FOLATE,FERRITIN,TIBC,IRON,RETICCTPCT in the last 72 hours Coagulation: No results found for this basename: LABPROT:2,INR:2 in the last 72 hours Urine Drug Screen: Drugs of Abuse  No results found for this basename: labopia,  cocainscrnur,  labbenz,  amphetmu,  thcu,  labbarb    Alcohol Level: No results found for this basename: ETH:2 in the last 72 hours Urinalysis: No results found for this basename: COLORURINE:2,APPERANCEUR:2,LABSPEC:2,PHURINE:2,GLUCOSEU:2,HGBUR:2,BILIRUBINUR:2,KETONESUR:2,PROTEINUR:2,UROBILINOGEN:2,NITRITE:2,LEUKOCYTESUR:2 in the last 72 hours Misc. Labs:  Micro: Recent Results (from the past 240 hour(s))  URINE CULTURE     Status: Normal   Collection Time   01/10/12 10:38 PM      Component Value Range Status Comment   Specimen Description URINE, CLEAN CATCH   Final    Special Requests NONE   Final    Culture  Setup Time 01/11/2012 21:42   Final    Colony Count NO GROWTH   Final    Culture NO GROWTH   Final    Report Status 01/12/2012  FINAL   Final   CULTURE, BLOOD (ROUTINE X 2)     Status: Normal (Preliminary result)   Collection Time   01/11/12  1:11 AM      Component Value Range Status Comment   Specimen Description BLOOD RIGHT ARM   Final    Special Requests BOTTLES DRAWN AEROBIC AND ANAEROBIC 6CC   Final    Culture NO GROWTH 4 DAYS   Final    Report Status PENDING   Incomplete   CULTURE, BLOOD (ROUTINE X 2)     Status: Normal (Preliminary result)   Collection Time   01/11/12  1:12 AM      Component Value Range Status Comment   Specimen Description BLOOD LEFT HAND   Final    Special Requests BOTTLES DRAWN AEROBIC AND ANAEROBIC 6CC   Final    Culture NO GROWTH 4 DAYS   Final    Report Status PENDING   Incomplete   MRSA PCR SCREENING     Status: Normal   Collection Time   01/11/12  3:33 AM      Component Value Range Status Comment   MRSA by PCR NEGATIVE  NEGATIVE Final   CULTURE, ROUTINE-ABSCESS     Status: Normal   Collection Time   01/12/12 11:19 AM      Component Value Range Status Comment   Specimen Description PERITONEAL CAVITY   Final    Special Requests Normal   Final    Gram Stain     Final    Value: ABUNDANT WBC PRESENT, PREDOMINANTLY PMN     NO SQUAMOUS EPITHELIAL CELLS SEEN     ABUNDANT GRAM NEGATIVE RODS     ABUNDANT GRAM POSITIVE COCCI IN PAIRS     IN CHAINS IN CLUSTERS   Culture MODERATE ESCHERICHIA COLI   Final    Report Status 01/14/2012 FINAL   Final    Organism ID, Bacteria ESCHERICHIA COLI   Final   ANAEROBIC CULTURE     Status: Normal (Preliminary result)   Collection Time   01/12/12 11:19 AM      Component Value Range Status Comment   Specimen Description PERITONEAL CAVITY   Final    Special Requests Normal   Final    Gram Stain     Final    Value: ABUNDANT WBC PRESENT, PREDOMINANTLY PMN     NO SQUAMOUS EPITHELIAL CELLS SEEN     ABUNDANT GRAM NEGATIVE RODS     ABUNDANT GRAM POSITIVE COCCI IN PAIRS     IN CLUSTERS IN CHAINS   Culture     Final    Value: NO ANAEROBES ISOLATED;  CULTURE IN PROGRESS FOR 5 DAYS   Report Status PENDING   Incomplete   CLOSTRIDIUM DIFFICILE BY PCR     Status: Normal   Collection Time   01/13/12  9:51 PM      Component Value Range Status Comment   C difficile by pcr NEGATIVE  NEGATIVE  Final   STOOL CULTURE     Status: Normal (Preliminary result)   Collection Time   01/13/12  9:51 PM      Component Value Range Status Comment   Specimen Description STOOL   Final    Special Requests NONE   Final    Culture Culture reincubated for better growth   Final    Report Status PENDING   Incomplete   CLOSTRIDIUM DIFFICILE BY PCR     Status: Normal   Collection Time   01/14/12  5:15 PM      Component Value Range Status Comment   C difficile by pcr NEGATIVE  NEGATIVE Final     Studies/Results: No results found.  Medications:  Scheduled:    . cefTRIAXone (ROCEPHIN)  IV  1 g Intravenous Q24H  . enoxaparin (LOVENOX) injection  40 mg Subcutaneous Q24H  . feeding supplement  237 mL Oral TID BM  . furosemide  20 mg Oral Daily  . potassium chloride  20 mEq Oral BID  . vancomycin  1,250 mg Intravenous Q12H  . DISCONTD: feeding supplement  1 Container Oral BID BM  . DISCONTD: furosemide  20 mg Oral BID  . DISCONTD: metroNIDAZOLE  500 mg Oral Q8H  . DISCONTD: phosphorus  250 mg Oral BID  . DISCONTD: piperacillin-tazobactam (ZOSYN)  IV  3.375 g Intravenous Q8H  . DISCONTD: sodium chloride  10-40 mL Intracatheter Q12H  . DISCONTD: sodium chloride  3 mL Intravenous Q12H   Continuous:    . 0.9 % NaCl with KCl 20 mEq / L 20 mL/hr at 01/14/12 1300   EAV:[id number], acetaminophen, albuterol, ALPRAZolam, HYDROmorphone (DILAUDID) injection, loperamide, ondansetron (ZOFRAN) IV, ondansetron, sodium chloride  Assessment: Principal Problem:  *Intra-abdominal abscess post-procedure Active Problems:  Hypokalemia  Dehydration  Diarrhea  Malnutrition, calorie  Iron deficiency anemia  Left arm swelling  Thrombocytosis  Hypophosphatemia   Hypomagnesemia  Peripheral edema    1. Intra-abdominal abscess in the setting of recent bowel surgery. One of the abscess cultures is growing out Escherichia coli, almost pansensitive. The other culture result is pending. Her white blood cell count is decreasing. Antibiotic therapy was narrowed to ceftriaxone and vancomycin on 01/15/12 after Zosyn and Flagyl were discontinued.  Her urine culture is negative. Blood cultures are negative to date. Dr. Lovell Sheehan assessment and recommendations noted and appreciated.  Diarrhea. I'm not sure how much diarrhea the patient is actually having. C. difficile PCR negative x2. Empiric Flagyl was discontinued on 01/15/2012. The nutritional supplements which she attributes to the worsening diarrhea have been discontinued. She requests Glucerna which she is tolerating well. Etiology of her diarrhea is unclear at this time.  Left arm swelling. Ultrasound notes a superficial thrombus, but no DVT. This does not require full dose anticoagulation. We'll continue prophylactic Lovenox.  Generalized peripheral edema. This is likely secondary to volume repletion in addition to IV fluids given with antibiotics and electrolyte supplementation over the past several days. Her ins and outs are positive. Lasix was started with some decrease in her peripheral edema.  Severe malnutrition. Her albumin is low. This may also be contributing to generalized edema. The patient has been evaluated by the registered dietitian. We'll continue Glucerna.  Iron deficiency anemia. In addition to iron deficiency anemia, her anemia is also likely secondary to acute infection. We'll hold off on iron supplementation until her infection is clinically resolving. Her hemoglobin decreased to a nadir of 7.9 but increased without packed red blood cell transfusions.  Electrolyte derangements including hypokalemia,  hypomagnesemia, and hypophosphatemia. Status post oral potassium. The patient's potassium level  has improved significantly. Status post IV magnesium sulfate. The patient's magnesium level is still borderline low but improved. Status post IV phosphorus and oral K-Phos. Her electrolytes are much improved.  Thrombocytosis. Her platelet count is increasing. The increase is being attributable to acute phase reaction. However, will consider getting a hematology consult and a blood smear. We'll continue to follow.  The patient appears to have some anxiety about going home.   Plan:  1. Discontinue Lasix. We'll add gentle IV fluids with dextrose given her marginal intake. He 2. Continue supportive treatment. 3. We'll at the nursing staff to quantify her loose stools. 4. Consider followup CT scan of the abdomen today. We'll discuss with Dr. Lovell Sheehan. 5. Encourage the patient to get out of bed to the chair.   LOS: 6 days   Yvette Branch 01/16/2012, 8:35 AM

## 2012-01-16 NOTE — Progress Notes (Signed)
  Subjective: Still has lingering complaints of diarrhea and lower abdominal pain.  Objective: Vital signs in last 24 hours: Temp:  [97.6 F (36.4 C)-98.2 F (36.8 C)] 98.2 F (36.8 C) (10/06 0410) Pulse Rate:  [87-91] 87  (10/06 0410) Resp:  [18-20] 18  (10/06 0410) BP: (103-119)/(47-74) 119/74 mmHg (10/06 0410) SpO2:  [94 %-98 %] 94 % (10/06 0410) Last BM Date: 01/16/12  Intake/Output from previous day: 10/05 0701 - 10/06 0700 In: 2270 [P.O.:1020; I.V.:700; IV Piggyback:550] Out: 3166 [Urine:3150; Drains:15; Stool:1] Intake/Output this shift: Total I/O In: -  Out: 875 [Urine:875]  GI: Soft. No rigidity noted. Incision healing well. Bowel sounds active. Drainage decreased.  Lab Results:   [facility] 01/16/12 0603 01/15/12 0715  WBC 16.5* 16.0*  HGB 8.8* 8.5*  HCT 25.9* 25.1*  PLT 843* 740*   BMET  Basename 01/15/12 0715 01/14/12 0446  NA 138 135  K 3.7 4.2  CL 101 102  CO2 32 28  GLUCOSE 101* 116*  BUN 3* 3*  CREATININE 0.51 0.48*  CALCIUM 7.8* 7.3*   PT/INR No results found for this basename: LABPROT:2,INR:2 in the last 72 hours  Studies/Results: No results found.  Anti-infectives: Anti-infectives     Start     Dose/Rate Route Frequency Ordered Stop   01/15/12 1000   cefTRIAXone (ROCEPHIN) 1 g in dextrose 5 % 50 mL IVPB        1 g 100 mL/hr over 30 Minutes Intravenous Every 24 hours 01/15/12 0919     01/14/12 0900   metroNIDAZOLE (FLAGYL) tablet 500 mg  Status:  Discontinued        500 mg Oral 3 times per day 01/14/12 0850 01/15/12 0917   01/12/12 1500   vancomycin (VANCOCIN) 1,250 mg in sodium chloride 0.9 % 250 mL IVPB        1,250 mg 166.7 mL/hr over 90 Minutes Intravenous Every 12 hours 01/12/12 1457     01/11/12 1500   vancomycin (VANCOCIN) 750 mg in sodium chloride 0.9 % 150 mL IVPB  Status:  Discontinued        750 mg 150 mL/hr over 60 Minutes Intravenous Every 12 hours 01/11/12 0358 01/12/12 1457   01/11/12 0900   metroNIDAZOLE (FLAGYL)  IVPB 500 mg  Status:  Discontinued        500 mg 100 mL/hr over 60 Minutes Intravenous Every 8 hours 01/11/12 0820 01/14/12 0850   01/11/12 0500   piperacillin-tazobactam (ZOSYN) IVPB 3.375 g  Status:  Discontinued        3.375 g 12.5 mL/hr over 240 Minutes Intravenous Every 8 hours 01/11/12 0358 01/15/12 0917   01/11/12 0130   vancomycin (VANCOCIN) IVPB 1000 mg/200 mL premix  Status:  Discontinued        1,000 mg 200 mL/hr over 60 Minutes Intravenous  Once 01/11/12 0121 01/11/12 0337   01/11/12 0130   piperacillin-tazobactam (ZOSYN) IVPB 3.375 g  Status:  Discontinued        3.375 g 12.5 mL/hr over 240 Minutes Intravenous  Once 01/11/12 0121 01/11/12 9604          Assessment/Plan: Impression: Intra-abdominal abscess, resolving. Still with leukocytosis. We'll get followup CT scan of the abdomen and pelvis.  LOS: 6 days    Yvette Branch 01/16/2012

## 2012-01-17 DIAGNOSIS — R609 Edema, unspecified: Secondary | ICD-10-CM | POA: Diagnosis present

## 2012-01-17 LAB — CBC WITH DIFFERENTIAL/PLATELET
Basophils Absolute: 0 K/uL (ref 0.0–0.1)
Basophils Relative: 0 % (ref 0–1)
Eosinophils Absolute: 0.2 K/uL (ref 0.0–0.7)
Eosinophils Relative: 2 % (ref 0–5)
HCT: 22.8 % — ABNORMAL LOW (ref 36.0–46.0)
Hemoglobin: 7.6 g/dL — ABNORMAL LOW (ref 12.0–15.0)
Lymphocytes Relative: 14 % (ref 12–46)
Lymphs Abs: 2.1 K/uL (ref 0.7–4.0)
MCH: 28.1 pg (ref 26.0–34.0)
MCHC: 33.3 g/dL (ref 30.0–36.0)
MCV: 84.4 fL (ref 78.0–100.0)
Monocytes Absolute: 1.3 K/uL — ABNORMAL HIGH (ref 0.1–1.0)
Monocytes Relative: 9 % (ref 3–12)
Neutro Abs: 11.5 K/uL — ABNORMAL HIGH (ref 1.7–7.7)
Neutrophils Relative %: 76 % (ref 43–77)
Platelets: 684 K/uL — ABNORMAL HIGH (ref 150–400)
RBC: 2.7 MIL/uL — ABNORMAL LOW (ref 3.87–5.11)
RDW: 13.8 % (ref 11.5–15.5)
WBC: 15.2 K/uL — ABNORMAL HIGH (ref 4.0–10.5)

## 2012-01-17 LAB — BASIC METABOLIC PANEL WITH GFR
BUN: 3 mg/dL — ABNORMAL LOW (ref 6–23)
CO2: 30 meq/L (ref 19–32)
Calcium: 8 mg/dL — ABNORMAL LOW (ref 8.4–10.5)
Chloride: 106 meq/L (ref 96–112)
Creatinine, Ser: 0.48 mg/dL — ABNORMAL LOW (ref 0.50–1.10)
GFR calc Af Amer: >90 mL/min
GFR calc non Af Amer: >90 mL/min
Glucose, Bld: 97 mg/dL (ref 70–99)
Potassium: 4.5 meq/L (ref 3.5–5.1)
Sodium: 140 meq/L (ref 135–145)

## 2012-01-17 LAB — CBC
HCT: 23 % — ABNORMAL LOW (ref 36.0–46.0)
MCH: 28.3 pg (ref 26.0–34.0)
MCV: 84.6 fL (ref 78.0–100.0)
Platelets: 729 10*3/uL — ABNORMAL HIGH (ref 150–400)
RDW: 13.8 % (ref 11.5–15.5)

## 2012-01-17 LAB — VANCOMYCIN, TROUGH: Vancomycin Tr: 28 ug/mL (ref 10.0–20.0)

## 2012-01-17 LAB — ANAEROBIC CULTURE: Special Requests: NORMAL

## 2012-01-17 MED ORDER — VANCOMYCIN HCL 1000 MG IV SOLR
1500.0000 mg | INTRAVENOUS | Status: DC
  Administered 2012-01-18: 1500 mg via INTRAVENOUS
  Filled 2012-01-17 (×2): qty 1500

## 2012-01-17 MED ORDER — SODIUM CHLORIDE 0.9 % IJ SOLN
10.0000 mL | Freq: Two times a day (BID) | INTRAMUSCULAR | Status: DC
  Administered 2012-01-17 (×2): 10 mL
  Filled 2012-01-17: qty 3

## 2012-01-17 MED ORDER — FUROSEMIDE 20 MG PO TABS
20.0000 mg | ORAL_TABLET | Freq: Every day | ORAL | Status: DC
  Administered 2012-01-17 – 2012-01-18 (×2): 20 mg via ORAL
  Filled 2012-01-17 (×2): qty 1

## 2012-01-17 MED ORDER — SODIUM CHLORIDE 0.9 % IJ SOLN
INTRAMUSCULAR | Status: AC
  Filled 2012-01-17: qty 3

## 2012-01-17 MED ORDER — FERROUS SULFATE 325 (65 FE) MG PO TABS
325.0000 mg | ORAL_TABLET | Freq: Every day | ORAL | Status: DC
  Administered 2012-01-17 – 2012-01-18 (×2): 325 mg via ORAL
  Filled 2012-01-17 (×2): qty 1

## 2012-01-17 MED ORDER — SODIUM CHLORIDE 0.9 % IJ SOLN
INTRAMUSCULAR | Status: AC
  Administered 2012-01-17: 14:00:00
  Filled 2012-01-17: qty 3

## 2012-01-17 NOTE — Progress Notes (Signed)
ANTIBIOTIC CONSULT NOTE - FOLLOW UP  Pharmacy Consult for Vancomycin Indication: pelvic abscess  Allergies  Allergen Reactions  . Hydrocodone Other (See Comments)    REACTION: causes pains in stomach   Patient Measurements: Height: 5\' 2"  (157.5 cm) Weight: 133 lb 13.1 oz (60.7 kg) IBW/kg (Calculated) : 50.1   Vital Signs: Temp: 98 F (36.7 C) (10/07 0432) Temp src: Oral (10/07 0432) BP: 119/76 mmHg (10/07 0432) Pulse Rate: 90  (10/07 0937) Intake/Output from previous day: 10/06 0701 - 10/07 0700 In: 3411 [P.O.:1437; I.V.:1424; IV Piggyback:550] Out: 4525 [Urine:4275; Stool:250] Intake/Output from this shift: Total I/O In: 10 [I.V.:10] Out: -   Labs:  Basename 01/17/12 0500 01/16/12 0603 01/15/12 0715  WBC 15.2* 16.5* 16.0*  HGB 7.6* 8.8* 8.5*  PLT 684* 843* 740*  LABCREA -- -- --  CREATININE 0.48* -- 0.51   Estimated Creatinine Clearance: 60.1 ml/min (by C-G formula based on Cr of 0.48).  Basename 01/17/12 1314  VANCOTROUGH 28.0*  VANCOPEAK --  Drue Dun --  GENTTROUGH --  GENTPEAK --  GENTRANDOM --  TOBRATROUGH --  TOBRAPEAK --  TOBRARND --  AMIKACINPEAK --  AMIKACINTROU --  AMIKACIN --    Microbiology: Recent Results (from the past 720 hour(s))  URINE CULTURE     Status: Normal   Collection Time   01/10/12 10:38 PM      Component Value Range Status Comment   Specimen Description URINE, CLEAN CATCH   Final    Special Requests NONE   Final    Culture  Setup Time 01/11/2012 21:42   Final    Colony Count NO GROWTH   Final    Culture NO GROWTH   Final    Report Status 01/12/2012 FINAL   Final   CULTURE, BLOOD (ROUTINE X 2)     Status: Normal   Collection Time   01/11/12  1:11 AM      Component Value Range Status Comment   Specimen Description BLOOD RIGHT ARM   Final    Special Requests BOTTLES DRAWN AEROBIC AND ANAEROBIC 6CC   Final    Culture NO GROWTH 5 DAYS   Final    Report Status 01/16/2012 FINAL   Final   CULTURE, BLOOD (ROUTINE X 2)      Status: Normal   Collection Time   01/11/12  1:12 AM      Component Value Range Status Comment   Specimen Description BLOOD LEFT HAND   Final    Special Requests BOTTLES DRAWN AEROBIC AND ANAEROBIC 6CC   Final    Culture NO GROWTH 5 DAYS   Final    Report Status 01/16/2012 FINAL   Final   MRSA PCR SCREENING     Status: Normal   Collection Time   01/11/12  3:33 AM      Component Value Range Status Comment   MRSA by PCR NEGATIVE  NEGATIVE Final   CULTURE, ROUTINE-ABSCESS     Status: Normal   Collection Time   01/12/12 11:19 AM      Component Value Range Status Comment   Specimen Description PERITONEAL CAVITY   Final    Special Requests Normal   Final    Gram Stain     Final    Value: ABUNDANT WBC PRESENT, PREDOMINANTLY PMN     NO SQUAMOUS EPITHELIAL CELLS SEEN     ABUNDANT GRAM NEGATIVE RODS     ABUNDANT GRAM POSITIVE COCCI IN PAIRS     IN CHAINS IN CLUSTERS  Culture MODERATE ESCHERICHIA COLI   Final    Report Status 01/14/2012 FINAL   Final    Organism ID, Bacteria ESCHERICHIA COLI   Final   ANAEROBIC CULTURE     Status: Normal   Collection Time   01/12/12 11:19 AM      Component Value Range Status Comment   Specimen Description PERITONEAL CAVITY   Final    Special Requests Normal   Final    Gram Stain     Final    Value: ABUNDANT WBC PRESENT, PREDOMINANTLY PMN     NO SQUAMOUS EPITHELIAL CELLS SEEN     ABUNDANT GRAM NEGATIVE RODS     ABUNDANT GRAM POSITIVE COCCI IN PAIRS     IN CLUSTERS IN CHAINS   Culture NO ANAEROBES ISOLATED   Final    Report Status 01/17/2012 FINAL   Final   CLOSTRIDIUM DIFFICILE BY PCR     Status: Normal   Collection Time   01/13/12  9:51 PM      Component Value Range Status Comment   C difficile by pcr NEGATIVE  NEGATIVE Final   STOOL CULTURE     Status: Normal (Preliminary result)   Collection Time   01/13/12  9:51 PM      Component Value Range Status Comment   Specimen Description STOOL   Final    Special Requests NONE   Final    Culture NO  SUSPICIOUS COLONIES, CONTINUING TO HOLD   Final    Report Status PENDING   Incomplete   CLOSTRIDIUM DIFFICILE BY PCR     Status: Normal   Collection Time   01/14/12  5:15 PM      Component Value Range Status Comment   C difficile by pcr NEGATIVE  NEGATIVE Final     Anti-infectives     Start     Dose/Rate Route Frequency Ordered Stop   01/18/12 1000   vancomycin (VANCOCIN) 1,500 mg in sodium chloride 0.9 % 500 mL IVPB        1,500 mg 250 mL/hr over 120 Minutes Intravenous Every 24 hours 01/17/12 1431     01/15/12 1000   cefTRIAXone (ROCEPHIN) 1 g in dextrose 5 % 50 mL IVPB        1 g 100 mL/hr over 30 Minutes Intravenous Every 24 hours 01/15/12 0919     01/14/12 0900   metroNIDAZOLE (FLAGYL) tablet 500 mg  Status:  Discontinued        500 mg Oral 3 times per day 01/14/12 0850 01/15/12 0917   01/12/12 1500   vancomycin (VANCOCIN) 1,250 mg in sodium chloride 0.9 % 250 mL IVPB  Status:  Discontinued        1,250 mg 166.7 mL/hr over 90 Minutes Intravenous Every 12 hours 01/12/12 1457 01/17/12 1431   01/11/12 1500   vancomycin (VANCOCIN) 750 mg in sodium chloride 0.9 % 150 mL IVPB  Status:  Discontinued        750 mg 150 mL/hr over 60 Minutes Intravenous Every 12 hours 01/11/12 0358 01/12/12 1457   01/11/12 0900   metroNIDAZOLE (FLAGYL) IVPB 500 mg  Status:  Discontinued        500 mg 100 mL/hr over 60 Minutes Intravenous Every 8 hours 01/11/12 0820 01/14/12 0850   01/11/12 0500   piperacillin-tazobactam (ZOSYN) IVPB 3.375 g  Status:  Discontinued        3.375 g 12.5 mL/hr over 240 Minutes Intravenous Every 8 hours 01/11/12 0358 01/15/12 0917   01/11/12  0130   vancomycin (VANCOCIN) IVPB 1000 mg/200 mL premix  Status:  Discontinued        1,000 mg 200 mL/hr over 60 Minutes Intravenous  Once 01/11/12 0121 01/11/12 0337   01/11/12 0130   piperacillin-tazobactam (ZOSYN) IVPB 3.375 g  Status:  Discontinued        3.375 g 12.5 mL/hr over 240 Minutes Intravenous  Once 01/11/12 0121  01/11/12 1610         Assessment: 66yo female with pelvic abscess which is reportedly improving.  Trough level is above goal on current dose.  Renal fxn is stable.  Goal of Therapy:  Vancomycin trough level 15-20 mcg/ml  Plan: Reduce Vancomycin to 1500mg  iv q24hrs Trough level weekly while on Vancomycin Monitor renal fxn, labs, and cultures per protocol  Valrie Hart A 01/17/2012,2:35 PM

## 2012-01-17 NOTE — Progress Notes (Signed)
Subjective: The patient complains of mild to moderate abdominal pain. She still has loose stools but they have slowed down. She says her appetite is improving some.  Objective: Vital signs in last 24 hours: Filed Vitals:   01/16/12 1005 01/16/12 1423 01/16/12 2125 01/17/12 0432  BP:  121/77 106/71 119/76  Pulse:  88 91 96  Temp:  97.9 F (36.6 C) 98.4 F (36.9 C) 98 F (36.7 C)  TempSrc:   Oral Oral  Resp:  18 18 18   Height:      Weight:      SpO2: 95% 99% 97% 97%    Intake/Output Summary (Last 24 hours) at 01/17/12 0934 Last data filed at 01/17/12 0553  Gross per 24 hour  Intake   3171 ml  Output   3850 ml  Net   -679 ml    Weight change:   Physical exam: General: Pleasant alert 66 year old Caucasian woman sitting up in bed, in no acute distress. Lungs: Decreased breath sounds in the bases, otherwise clear Heart: S1, S2, with no murmurs rubs or gallops. Abdomen: Her abdomen is very soft, and minimally tender over the left slight. Healing vertical scar. Left flank drain in place, draining a scant amount of tan cloudy fluid. Hypoactive bowel sounds. Extremities: Trace left upper extremity edema. Trace to 1+ bilateral lower extremity edema. PICC line in place in the right upper extremity. Pedal pulses palpable. Neurologic: She is alert and oriented x3. Cranial nerves II through XII are intact.  Lab Results: Basic Metabolic Panel:  Basename 01/17/12 0500 01/15/12 0715  NA 140 138  K 4.5 3.7  CL 106 101  CO2 30 32  GLUCOSE 97 101*  BUN 3* 3*  CREATININE 0.48* 0.51  CALCIUM 8.0* 7.8*  MG -- --  PHOS 4.0 4.5   Liver Function Tests:  Basename 01/15/12 0715  AST 17  ALT 10  ALKPHOS 82  BILITOT 0.2*  PROT 4.8*  ALBUMIN 1.6*   No results found for this basename: LIPASE:2,AMYLASE:2 in the last 72 hours No results found for this basename: AMMONIA:2 in the last 72 hours CBC:  Basename 01/17/12 0500 01/16/12 0603 01/15/12 0715  WBC 15.2* 16.5* --  NEUTROABS  11.5* -- 11.9*  HGB 7.6* 8.8* --  HCT 22.8* 25.9* --  MCV 84.4 83.5 --  PLT 684* 843* --   Cardiac Enzymes: No results found for this basename: CKTOTAL:3,CKMB:3,CKMBINDEX:3,TROPONINI:3 in the last 72 hours BNP: No results found for this basename: PROBNP:3 in the last 72 hours D-Dimer: No results found for this basename: DDIMER:2 in the last 72 hours CBG: No results found for this basename: GLUCAP:6 in the last 72 hours Hemoglobin A1C: No results found for this basename: HGBA1C in the last 72 hours Fasting Lipid Panel: No results found for this basename: CHOL,HDL,LDLCALC,TRIG,CHOLHDL,LDLDIRECT in the last 72 hours Thyroid Function Tests: No results found for this basename: TSH,T4TOTAL,FREET4,T3FREE,THYROIDAB in the last 72 hours Anemia Panel: No results found for this basename: VITAMINB12,FOLATE,FERRITIN,TIBC,IRON,RETICCTPCT in the last 72 hours Coagulation: No results found for this basename: LABPROT:2,INR:2 in the last 72 hours Urine Drug Screen: Drugs of Abuse  No results found for this basename: labopia,  cocainscrnur,  labbenz,  amphetmu,  thcu,  labbarb    Alcohol Level: No results found for this basename: ETH:2 in the last 72 hours Urinalysis: No results found for this basename: COLORURINE:2,APPERANCEUR:2,LABSPEC:2,PHURINE:2,GLUCOSEU:2,HGBUR:2,BILIRUBINUR:2,KETONESUR:2,PROTEINUR:2,UROBILINOGEN:2,NITRITE:2,LEUKOCYTESUR:2 in the last 72 hours Misc. Labs:   Micro: Recent Results (from the past 240 hour(s))  URINE CULTURE  Status: Normal   Collection Time   01/10/12 10:38 PM      Component Value Range Status Comment   Specimen Description URINE, CLEAN CATCH   Final    Special Requests NONE   Final    Culture  Setup Time 01/11/2012 21:42   Final    Colony Count NO GROWTH   Final    Culture NO GROWTH   Final    Report Status 01/12/2012 FINAL   Final   CULTURE, BLOOD (ROUTINE X 2)     Status: Normal   Collection Time   01/11/12  1:11 AM      Component Value Range  Status Comment   Specimen Description BLOOD RIGHT ARM   Final    Special Requests BOTTLES DRAWN AEROBIC AND ANAEROBIC 6CC   Final    Culture NO GROWTH 5 DAYS   Final    Report Status 01/16/2012 FINAL   Final   CULTURE, BLOOD (ROUTINE X 2)     Status: Normal   Collection Time   01/11/12  1:12 AM      Component Value Range Status Comment   Specimen Description BLOOD LEFT HAND   Final    Special Requests BOTTLES DRAWN AEROBIC AND ANAEROBIC 6CC   Final    Culture NO GROWTH 5 DAYS   Final    Report Status 01/16/2012 FINAL   Final   MRSA PCR SCREENING     Status: Normal   Collection Time   01/11/12  3:33 AM      Component Value Range Status Comment   MRSA by PCR NEGATIVE  NEGATIVE Final   CULTURE, ROUTINE-ABSCESS     Status: Normal   Collection Time   01/12/12 11:19 AM      Component Value Range Status Comment   Specimen Description PERITONEAL CAVITY   Final    Special Requests Normal   Final    Gram Stain     Final    Value: ABUNDANT WBC PRESENT, PREDOMINANTLY PMN     NO SQUAMOUS EPITHELIAL CELLS SEEN     ABUNDANT GRAM NEGATIVE RODS     ABUNDANT GRAM POSITIVE COCCI IN PAIRS     IN CHAINS IN CLUSTERS   Culture MODERATE ESCHERICHIA COLI   Final    Report Status 01/14/2012 FINAL   Final    Organism ID, Bacteria ESCHERICHIA COLI   Final   ANAEROBIC CULTURE     Status: Normal (Preliminary result)   Collection Time   01/12/12 11:19 AM      Component Value Range Status Comment   Specimen Description PERITONEAL CAVITY   Final    Special Requests Normal   Final    Gram Stain     Final    Value: ABUNDANT WBC PRESENT, PREDOMINANTLY PMN     NO SQUAMOUS EPITHELIAL CELLS SEEN     ABUNDANT GRAM NEGATIVE RODS     ABUNDANT GRAM POSITIVE COCCI IN PAIRS     IN CLUSTERS IN CHAINS   Culture     Final    Value: NO ANAEROBES ISOLATED; CULTURE IN PROGRESS FOR 5 DAYS   Report Status PENDING   Incomplete   CLOSTRIDIUM DIFFICILE BY PCR     Status: Normal   Collection Time   01/13/12  9:51 PM       Component Value Range Status Comment   C difficile by pcr NEGATIVE  NEGATIVE Final   STOOL CULTURE     Status: Normal (Preliminary result)   Collection Time  01/13/12  9:51 PM      Component Value Range Status Comment   Specimen Description STOOL   Final    Special Requests NONE   Final    Culture NO SUSPICIOUS COLONIES, CONTINUING TO HOLD   Final    Report Status PENDING   Incomplete   CLOSTRIDIUM DIFFICILE BY PCR     Status: Normal   Collection Time   01/14/12  5:15 PM      Component Value Range Status Comment   C difficile by pcr NEGATIVE  NEGATIVE Final     Studies/Results: Ct Abdomen Pelvis Wo Contrast  01/16/2012  *RADIOLOGY REPORT*  Clinical Data: Follow-up pelvic abscess status post CT guided drainage.  CT ABDOMEN AND PELVIS WITHOUT CONTRAST  Technique:  Multidetector CT imaging of the abdomen and pelvis was performed following the standard protocol without intravenous contrast.  Comparison: Abdominal pelvic CT 01/10/2012.  Findings: The bilateral pleural effusions and associated atelectasis at both lung bases have mildly improved.  There is a left transgluteal pelvic drain which appears well positioned within the previously demonstrated central pelvic fluid collection.  This collection is much smaller, measuring approximately 5.9 x 2.5 cm transverse on image 66 (previously approximately 10 cm in diameter. A 1.9 cm fluid collection in the right false pelvis on image 55 has slightly improved.  No other focal fluid collections are identified.  However, there is slightly increased abdominal ascites.  There is generalized stranding throughout the subcutaneous and mesenteric fat.  There is mild periportal edema within the liver.  The liver otherwise appears unremarkable as imaged in the noncontrast state. Likewise, the gallbladder, biliary system, pancreas, spleen, adrenal glands and kidneys appear unremarkable.  The bowel gas pattern is normal.  There is diffuse small bowel wall thickening  attributed to the anasarca.  Vaginal pessary is again noted.  The urinary bladder appears normal.  IMPRESSION:  1.  Near complete drainage of pelvic abscess status post transgluteal drain placement. 2.  Slight worsening of anasarca with diffuse soft tissue edema and mildly increased ascites.  No enlarging focal fluid collections identified. 3.  The pleural effusions and bibasilar atelectasis have improved.   Original Report Authenticated By: Gerrianne Scale, M.D.     Medications:  Scheduled:    . cefTRIAXone (ROCEPHIN)  IV  1 g Intravenous Q24H  . enoxaparin (LOVENOX) injection  40 mg Subcutaneous Q24H  . feeding supplement  237 mL Oral TID BM  . furosemide  20 mg Oral Daily  . sodium chloride  10-40 mL Intracatheter Q12H  . vancomycin  1,250 mg Intravenous Q12H   Continuous:    . dextrose 5 % and 0.9 % NaCl with KCl 20 mEq/L 50 mL/hr at 01/17/12 4098   JXB:[id number], acetaminophen, albuterol, ALPRAZolam, HYDROmorphone (DILAUDID) injection, loperamide, ondansetron (ZOFRAN) IV, ondansetron, sodium chloride  Assessment: Principal Problem:  *Intra-abdominal abscess post-procedure Active Problems:  Hypokalemia  Dehydration  Diarrhea  Malnutrition, calorie  Iron deficiency anemia  Left arm swelling  Thrombocytosis  Hypophosphatemia  Hypomagnesemia  Peripheral edema    1. Intra-abdominal abscess in the setting of recent bowel surgery. One of the abscess cultures is growing out Escherichia coli,  pansensitive. The other culture result is pending. Her followup CT of the abdomen/pelvis yesterday reveals near complete drainage of the pelvic abscess. Her white blood cell count is decreasing. Antibiotic therapy was narrowed to ceftriaxone and vancomycin on 01/15/12 after Zosyn and Flagyl were discontinued.  Her urine culture is negative. Blood cultures are negative to date.  Diarrhea. I'm not sure how much diarrhea the patient is actually having. C. difficile PCR negative x2.  Empiric Flagyl was discontinued on 01/15/2012. The nutritional supplements which she attributes to the worsening diarrhea have been discontinued. She requests Glucerna which she is tolerating well. Etiology of her diarrhea is unclear at this time.  Left arm swelling. Ultrasound notes a superficial thrombus, but no DVT. This does not require full dose anticoagulation. We'll continue prophylactic Lovenox.  Generalized peripheral edema/abdominal edema. This is likely secondary to volume repletion in addition to IV fluids given with antibiotics and electrolyte supplementation over the past several days and hypoalbuminemia. Her ins and outs are positive. Lasix was started with some decrease in her peripheral edema.  Severe malnutrition. Her albumin is low. This may also be contributing to generalized edema. The patient has been evaluated by the registered dietitian. We'll continue Glucerna.  Iron deficiency anemia. In addition to iron deficiency anemia, her anemia is also likely secondary to acute infection. Held off on iron supplementation until her infection was clinically resolving, but will try ferrous sulfate only once daily and assess her tolerance of it. Her hemoglobin has been waxing and waning, decreasing and increasing without acute blood loss or transfusion. Yesterday it was 8.8 and today is 7.6. Query accuracy of labs. We'll continue to follow.  Electrolyte derangements including hypokalemia, hypomagnesemia, and hypophosphatemia. Status post oral potassium. The patient's potassium level has improved significantly. Status post IV magnesium sulfate. The patient's magnesium level is still borderline low but improved. Status post IV phosphorus and oral K-Phos. Her electrolytes are much improved.  Thrombocytosis. Her platelet count is still quite elevated, but has decreased this morning. The increase is being attributable to acute phase reaction. However, will consider getting a hematology consult and a  blood smear. We'll continue to follow.  The patient appears to have some anxiety about going home.   Plan:  1.In light of the abdominal wall edema seen on the CT, will restart and continue Lasix gently. We'll decrease the gentle IV fluids started yesterday. 2. Continue supportive treatment. 3. Increase activity level. 4. Will discuss disposition and the antibiotic therapy with Dr. Lovell Sheehan when the patient is ready for discharge. Possible discharge tomorrow. 5. We'll monitor her hemoglobin and hematocrit further.   LOS: 7 days   Krishav Mamone 01/17/2012, 9:34 AM

## 2012-01-17 NOTE — Progress Notes (Signed)
Subjective: States diarrhea seems to be improving.  Objective: Vital signs in last 24 hours: Temp:  [97.9 F (36.6 C)-98.4 F (36.9 C)] 98 F (36.7 C) (10/07 0432) Pulse Rate:  [88-96] 90  (10/07 0937) Resp:  [18] 18  (10/07 0432) BP: (106-121)/(71-77) 119/76 mmHg (10/07 0432) SpO2:  [96 %-99 %] 96 % (10/07 0937) Last BM Date: 01/17/12  Intake/Output from previous day: 10/06 0701 - 10/07 0700 In: 3411 [P.O.:1437; I.V.:1424; IV Piggyback:550] Out: 4525 [Urine:4275; Stool:250] Intake/Output this shift: Total I/O In: 10 [I.V.:10] Out: -   General appearance: alert, cooperative and no distress GI: Soft, nontender, nondistended. Drainage minimal in tubing.  Lab Results:   Basename 01/17/12 0500 01/16/12 0603  WBC 15.2* 16.5*  HGB 7.6* 8.8*  HCT 22.8* 25.9*  PLT 684* 843*   BMET  Basename 01/17/12 0500 01/15/12 0715  NA 140 138  K 4.5 3.7  CL 106 101  CO2 30 32  GLUCOSE 97 101*  BUN 3* 3*  CREATININE 0.48* 0.51  CALCIUM 8.0* 7.8*   PT/INR No results found for this basename: LABPROT:2,INR:2 in the last 72 hours  Studies/Results: Ct Abdomen Pelvis Wo Contrast  01/16/2012  *RADIOLOGY REPORT*  Clinical Data: Follow-up pelvic abscess status post CT guided drainage.  CT ABDOMEN AND PELVIS WITHOUT CONTRAST  Technique:  Multidetector CT imaging of the abdomen and pelvis was performed following the standard protocol without intravenous contrast.  Comparison: Abdominal pelvic CT 01/10/2012.  Findings: The bilateral pleural effusions and associated atelectasis at both lung bases have mildly improved.  There is a left transgluteal pelvic drain which appears well positioned within the previously demonstrated central pelvic fluid collection.  This collection is much smaller, measuring approximately 5.9 x 2.5 cm transverse on image 66 (previously approximately 10 cm in diameter. A 1.9 cm fluid collection in the right false pelvis on image 55 has slightly improved.  No other focal  fluid collections are identified.  However, there is slightly increased abdominal ascites.  There is generalized stranding throughout the subcutaneous and mesenteric fat.  There is mild periportal edema within the liver.  The liver otherwise appears unremarkable as imaged in the noncontrast state. Likewise, the gallbladder, biliary system, pancreas, spleen, adrenal glands and kidneys appear unremarkable.  The bowel gas pattern is normal.  There is diffuse small bowel wall thickening attributed to the anasarca.  Vaginal pessary is again noted.  The urinary bladder appears normal.  IMPRESSION:  1.  Near complete drainage of pelvic abscess status post transgluteal drain placement. 2.  Slight worsening of anasarca with diffuse soft tissue edema and mildly increased ascites.  No enlarging focal fluid collections identified. 3.  The pleural effusions and bibasilar atelectasis have improved.   Original Report Authenticated By: Gerrianne Scale, M.D.     Anti-infectives: Anti-infectives     Start     Dose/Rate Route Frequency Ordered Stop   01/15/12 1000   cefTRIAXone (ROCEPHIN) 1 g in dextrose 5 % 50 mL IVPB        1 g 100 mL/hr over 30 Minutes Intravenous Every 24 hours 01/15/12 0919     01/14/12 0900   metroNIDAZOLE (FLAGYL) tablet 500 mg  Status:  Discontinued        500 mg Oral 3 times per day 01/14/12 0850 01/15/12 0917   01/12/12 1500   vancomycin (VANCOCIN) 1,250 mg in sodium chloride 0.9 % 250 mL IVPB        1,250 mg 166.7 mL/hr over 90 Minutes Intravenous Every  12 hours 01/12/12 1457     01/11/12 1500   vancomycin (VANCOCIN) 750 mg in sodium chloride 0.9 % 150 mL IVPB  Status:  Discontinued        750 mg 150 mL/hr over 60 Minutes Intravenous Every 12 hours 01/11/12 0358 01/12/12 1457   01/11/12 0900   metroNIDAZOLE (FLAGYL) IVPB 500 mg  Status:  Discontinued        500 mg 100 mL/hr over 60 Minutes Intravenous Every 8 hours 01/11/12 0820 01/14/12 0850   01/11/12 0500    piperacillin-tazobactam (ZOSYN) IVPB 3.375 g  Status:  Discontinued        3.375 g 12.5 mL/hr over 240 Minutes Intravenous Every 8 hours 01/11/12 0358 01/15/12 0917   01/11/12 0130   vancomycin (VANCOCIN) IVPB 1000 mg/200 mL premix  Status:  Discontinued        1,000 mg 200 mL/hr over 60 Minutes Intravenous  Once 01/11/12 0121 01/11/12 0337   01/11/12 0130   piperacillin-tazobactam (ZOSYN) IVPB 3.375 g  Status:  Discontinued        3.375 g 12.5 mL/hr over 240 Minutes Intravenous  Once 01/11/12 0121 01/11/12 1610          Assessment/Plan: Impression: Repeat CT scan shows resolution of the pelvic abscess. Her white blood cell count has decreased to 15,000. When she is discharged, I plan on taking out the drainage tube. She should continued on by mouth antibiotic coverage for Escherichia coli.  LOS: 7 days    Sahan Pen A 01/17/2012

## 2012-01-18 LAB — STOOL CULTURE

## 2012-01-18 LAB — CBC
HCT: 25.4 % — ABNORMAL LOW (ref 36.0–46.0)
MCH: 28 pg (ref 26.0–34.0)
MCHC: 33.1 g/dL (ref 30.0–36.0)
RDW: 14.3 % (ref 11.5–15.5)

## 2012-01-18 MED ORDER — CIPROFLOXACIN HCL 500 MG PO TABS: 500.0000 mg | ORAL_TABLET | Freq: Two times a day (BID) | ORAL | Status: DC

## 2012-01-18 MED ORDER — POLYETHYLENE GLYCOL 3350 17 G PO PACK: 17.0000 g | PACK | Freq: Every day | ORAL | Status: DC

## 2012-01-18 MED ORDER — FERROUS SULFATE 325 (65 FE) MG PO TABS: 325.0000 mg | ORAL_TABLET | Freq: Three times a day (TID) | ORAL | Status: DC

## 2012-01-18 MED ORDER — OXYCODONE-ACETAMINOPHEN 5-325 MG PO TABS: 1.0000 | ORAL_TABLET | ORAL | Status: DC | PRN

## 2012-01-18 MED ORDER — ALIGN 4 MG PO CAPS: 1.0000 | ORAL_CAPSULE | Freq: Every day | ORAL | Status: AC

## 2012-01-18 MED ORDER — LOPERAMIDE HCL 2 MG PO CAPS: 2.0000 mg | ORAL_CAPSULE | Freq: Four times a day (QID) | ORAL | Status: DC | PRN

## 2012-01-18 NOTE — Discharge Summary (Signed)
Physician Discharge Summary  Yvette Branch:[id number] DOB: [birth year] DOA: 01/10/2012  PCP: Colette Ribas, MD  Admit date: 01/10/2012 Discharge date: 01/18/2012  Recommendations for Outpatient Follow-up:  1. Patient will be set up with home health nursing and physical therapy to assist with strength training as well as managing intra-abdominal drain. 2. Followup with Dr. Lovell Sheehan in one week 3. Followup with primary care physician in 2 weeks.  Will need repeat CBC in 2 weeks.  Discharge Diagnoses:  Principal Problem:  *Intra-abdominal abscess post-procedure Active Problems:  Hypokalemia  Dehydration  Diarrhea  Malnutrition, calorie  Iron deficiency anemia  Left arm swelling  Thrombocytosis  Hypophosphatemia  Hypomagnesemia  Peripheral edema   Discharge Condition: Improved  Diet recommendation: Low salt  Filed Weights   01/12/12 0500 01/13/12 0500 01/14/12 0500  Weight: 56.9 kg (125 lb 7.1 oz) 58.3 kg (128 lb 8.5 oz) 60.7 kg (133 lb 13.1 oz)    History of present illness:  This is a 66 year old, Caucasian female, with a past with a history of osteoporosis, and osteoarthritis. She was admitted about the 2 weeks ago, at [facility] for small bowel obstruction. Adhesions were noted and patient underwent abdominal surgery. Patient was discharged subsequently. She went home. However, she did not feel any better. She still felt very weak. Had unsteady gait. Had dizziness at times. The abdominal pain persisted around the incision site. She's was having chills and then started having fevers in the last few days. She's been having diarrhea continuously since discharge. She reports 6-7 bowel movements on a daily basis. The stool is yellow in color. She notices some blood while wiping herself because she has hemorrhoids. The abdominal pain is at 8/10 in intensity. Is a sharp pain, which comes and goes. She's also noticed decreased amount of urination in the last few days. Denies  any blood in the urine. She's had poor oral intake. She reports dry mouth. Overall she has been feeling quite poorly and so, she decided to come to the hospital.   Hospital Course:  This lady was admitted to the hospital with complaints of abdominal pain and diarrhea. She was noted to be severely hypokalemic, and CT scan of the abdomen and pelvis revealed a intra-abdominal abscess. The patient recently had small bowel resection for a bowel obstruction. She was started on empiric antibiotics and had percutaneous drain placement by interventional radiology for abscess. Regarding her diarrhea, Clostridium difficile was found to be negative x2. Stool culture was also found to be unremarkable. She's been given Imodium as needed. Her diarrhea appears to be improving. The patient was also noted to be significantly anemic as well as had a thrombocytosis. This was felt to be secondary to iron deficiency. She's been started on replacement therapy and will need a repeat CBC in the next 2 weeks. Regarding her abscess, cultures from the abscess are growing Escherichia coli. This is pansensitive, and she'll be placed on oral ciprofloxacin for the next 2 weeks. On repeat CT scan of abdomen and pelvis, it was noted near complete drainage of pelvic abscess status post transgluteal drain placement. Patient will followup with Dr. Lovell Sheehan in one week to reassess drain. Currently she is felt stable to discharge home. She'll be set up with home health services to assist with drain management as well as physical therapy.   Procedures:  Percutaneous abdominal drain placement by interventional radiology on 10/2  Consultations:  General surgery, Dr. Lovell Sheehan  Interventional radiology, Dr. Archer Asa  Discharge Exam: Ceasar Mons Vitals:  01/17/12 0432 01/17/12 0937 01/17/12 2118 01/18/12 0612  BP: 119/76  115/73 130/81  Pulse: 96 90 87 96  Temp: 98 F (36.7 C)  98.2 F (36.8 C) 97.8 F (36.6 C)  TempSrc: Oral  Oral Oral    Resp: 18  18 19   Height:      Weight:      SpO2: 97% 96% 97% 98%    General: NAD Cardiovascular: S1, s2, rrr Respiratory: CTA B  Discharge Instructions  Discharge Orders    Future Orders Please Complete By Expires   Commode elevated 3 in 1      For home use only DME Tub bench      Diet - low sodium heart healthy      Home Health      Questions: Responses:   To provide the following care/treatments PT    RN   Face-to-face encounter      Comments:   I Jovi Zavadil certify that this patient is under my care and that I, or a nurse practitioner or physician's assistant working with me, had a face-to-face encounter that meets the physician face-to-face encounter requirements with this patient on 01/18/2012.   Questions: Responses:   The encounter with the patient was in whole, or in part, for the following medical condition, which is the primary reason for home health care intra abdominal abscess   I certify that, based on my findings, the following services are medically necessary home health services Physical therapy    Nursing   My clinical findings support the need for the above services Unhealed surgical wound   Further, I certify that my clinical findings support that this patient is homebound due to: Open/draining pressure/stasis ulcer   To provide the following care/treatments PT    RN   Care order/instruction      Comments:   Discontinue PICC line   Increase activity slowly      Call MD for:  temperature >100.4      Call MD for:  persistant nausea and vomiting      Call MD for:  severe uncontrolled pain      Call MD for:  redness, tenderness, or signs of infection (pain, swelling, redness, odor or green/yellow discharge around incision site)      Discharge wound care:      Comments:   Empty drain daily and document output       Medication List     As of 01/18/2012 11:42 AM    STOP taking these medications         HYDROcodone-acetaminophen 5-500 MG per tablet    Commonly known as: VICODIN      TAKE these medications         ALIGN 4 MG Caps   Take 1 capsule by mouth daily.      ALPRAZolam 0.25 MG tablet   Commonly known as: XANAX   Take 0.25 mg by mouth 4 (four) times daily as needed. For anxiety      aspirin EC 81 MG tablet   Take 81 mg by mouth daily.      BALANCED B-150 PO   Take 1 tablet by mouth daily.      ciprofloxacin 500 MG tablet   Commonly known as: CIPRO   Take 1 tablet (500 mg total) by mouth 2 (two) times daily.      FEMRING 0.05 MG/24HR Ring   Generic drug: Estradiol Acetate   Place 1 each vaginally every 3 (three) months.  ferrous sulfate 325 (65 FE) MG tablet   Take 1 tablet (325 mg total) by mouth 3 (three) times daily with meals.      Fish Oil 1200 MG Caps   Take 1 capsule by mouth daily.      GLUCOSAMINE MSM COMPLEX PO   Take 1 tablet by mouth 2 (two) times daily.      ibandronate 150 MG tablet   Commonly known as: BONIVA   Take 150 mg by mouth every 30 (thirty) days. Take in the morning with a full glass of water, on an empty stomach, and do not take anything else by mouth or lie down for the next 30 min.      lansoprazole 30 MG capsule   Commonly known as: PREVACID   Take 30 mg by mouth 2 (two) times daily.      loperamide 2 MG capsule   Commonly known as: IMODIUM   Take 1 capsule (2 mg total) by mouth 4 (four) times daily as needed for diarrhea or loose stools.      Magnesium 400 MG Caps   Take 1 capsule by mouth every other day.      oxyCODONE-acetaminophen 5-325 MG per tablet   Commonly known as: PERCOCET/ROXICET   Take 1 tablet by mouth every 4 (four) hours as needed for pain.      polyethylene glycol packet   Commonly known as: MIRALAX / GLYCOLAX   Take 17 g by mouth daily.      pyridOXINE 100 MG tablet   Commonly known as: VITAMIN B-6   Take 100 mg by mouth daily.      UNABLE TO FIND   Take 1 tablet by mouth 4 (four) times daily. Med Name: OSTEO-SHEATH (ENHANCED BONE  SUPPORT-CALCIUM SUPPLEMENT) Includes: Calcium 1221mg  and Vitamin D3 1200IU      vitamin A 8000 UNIT capsule   Take 8,000 Units by mouth daily.      vitamin C 500 MG tablet   Commonly known as: ASCORBIC ACID   Take 500 mg by mouth daily.      vitamin E 400 UNIT capsule   Take 400 Units by mouth daily.      vitamin k 100 MCG tablet   Take 100 mcg by mouth daily.           Follow-up Information    Follow up with [facility], [place].   Contact information:   [address] [place] [address] [phone]       Follow up with Dalia Heading, MD. Schedule an appointment as soon as possible for a visit on 01/25/2012.   Contact information:   [address] [place] [address] [phone]       Follow up with Colette Ribas, MD. Schedule an appointment as soon as possible for a visit in 2 weeks.   Contact information:   [address] [place] [address] [phone]           The results of significant diagnostics from this hospitalization (including imaging, microbiology, ancillary and laboratory) are listed below for reference.    Significant Diagnostic Studies: Ct Abdomen Pelvis Wo Contrast  01/16/2012  *RADIOLOGY REPORT*  Clinical Data: Follow-up pelvic abscess status post CT guided drainage.  CT ABDOMEN AND PELVIS WITHOUT CONTRAST  Technique:  Multidetector CT imaging of the abdomen and pelvis was performed following the standard protocol without intravenous contrast.  Comparison: Abdominal pelvic CT 01/10/2012.  Findings: The bilateral pleural effusions and associated  atelectasis at both lung bases have mildly improved.  There is a left transgluteal pelvic drain which appears well positioned within the previously demonstrated central pelvic fluid collection.  This collection is much smaller, measuring approximately 5.9 x 2.5 cm transverse on image 66 (previously approximately 10 cm in diameter. A 1.9 cm fluid collection in the  right false pelvis on image 55 has slightly improved.  No other focal fluid collections are identified.  However, there is slightly increased abdominal ascites.  There is generalized stranding throughout the subcutaneous and mesenteric fat.  There is mild periportal edema within the liver.  The liver otherwise appears unremarkable as imaged in the noncontrast state. Likewise, the gallbladder, biliary system, pancreas, spleen, adrenal glands and kidneys appear unremarkable.  The bowel gas pattern is normal.  There is diffuse small bowel wall thickening attributed to the anasarca.  Vaginal pessary is again noted.  The urinary bladder appears normal.  IMPRESSION:  1.  Near complete drainage of pelvic abscess status post transgluteal drain placement. 2.  Slight worsening of anasarca with diffuse soft tissue edema and mildly increased ascites.  No enlarging focal fluid collections identified. 3.  The pleural effusions and bibasilar atelectasis have improved.   Original Report Authenticated By: Gerrianne Scale, M.D.    Dg Chest 2 View  01/10/2012  *RADIOLOGY REPORT*  Clinical Data: Abdominal pain  CHEST - 2 VIEW  Comparison: 01/02/2012  Findings: Bilateral pleural effusions with associate consolidations, likely atelectasis.  Heart size and mediastinal contours within normal range.  No pneumothorax.  No acute osseous finding.  IMPRESSION: Small bilateral pleural effusions with associated consolidations, favor atelectasis.   Original Report Authenticated By: Waneta Martins, M.D.    Ct Guided Abscess Drain  01/12/2012  *RADIOLOGY REPORT*  CT GUIDED ABSCESS DRAIN  Date: 01/12/2012  Clinical History: 65 year old female with loculated fluid collection concerning for abscess in the rectouterine recess of [place].  She is 12 days postop laparotomy for small bowel obstruction.  Procedures Performed: 1. CT guided placement of a 12 French peritoneal abscess drain  Interventional Radiologist:  Sterling Big, MD   Sedation: Moderate (conscious) sedation was used.  3 mg Versed, 200 mcg Fentanyl were administered intravenously.  The patient's vital signs were monitored continuously by radiology nursing throughout the procedure.  Sedation Time: 25 minutes  PROCEDURE/FINDINGS:   Informed consent was obtained from the patient following explanation of the procedure, risks, benefits and alternatives. The patient understands, agrees and consents for the procedure. All questions were addressed. A time out was performed.  Maximal barrier sterile technique utilized including caps, mask, sterile gowns, sterile gloves, large sterile drape, hand hygiene, and betadine skin prep.  A planning axial CT scan was performed.  The loculated fluid collection in the rectouterine recess of [place] Lam was identified. An appropriate skin entry site was selected and marked.  Local anesthesia was achieved with infiltration of 1% lidocaine.  Under CT fluoroscopic guidance, an 18 gauge trocar needle was advanced through the skin, and soft tissues and into the fluid collection. Care was taken to pass the needle through the sacrospinous ligament and medial to the gluteal vessels.  A wire was then advanced into the pelvic fluid collection.  The skin track was then serially dilated to 12-French, and ultimately a 12-French Cook multipurpose drainage catheter was advanced over a wire and positioned within the fluid collection.  A total of 230 ml of frankly purulent, foul- smelling material was then successfully aspirated.  A sample was sent to  microbiology for Gram stain and culture.  The catheter was then secured to the skin with 0- Prolene suture and a soft plastic bumper. An adhesive retention device was also used. The catheter was connected to a bulb suction device.  The patient tolerated the procedure very well, there was no immediate complication.  IMPRESSION:  Successful placement of a 12 French drainage catheter in the deep pelvic abscess.  A total of 240  ml of frankly purulent, foul- smelling fluid was successfully aspirated.  A sample was sent for Gram stain and culture.  Signed,  Sterling Big, MD Vascular & Interventional Radiologist [facility]   Original Report Authenticated By: Threasa Beards Abdomen Pelvis W Contrast  01/11/2012  *RADIOLOGY REPORT*  Clinical Data: Abdominal pain  CT ABDOMEN AND PELVIS WITH CONTRAST  Technique:  Multidetector CT imaging of the abdomen and pelvis was performed following the standard protocol during bolus administration of intravenous contrast.  Contrast: OMNIPAQUE IOHEXOL 300 MG/ML  SOLN  Comparison: 01/06/2012 radiograph, 12/22/2011 CT[facility] possible  Findings: Small bilateral pleural effusions.  Associated consolidations, likely atelectasis.  Normal heart size.  Trace pericardial fluid.  Unremarkable liver, spleen, pancreas, adrenal glands.  Symmetric renal enhancement.  Mild collecting system fullness bilaterally.  No obstructing intraluminal lesion identified.  Small amount of water attenuation intraperitoneal fluid.  There is diffuse mesenteric edema and the distal colon is decompressed. There is circumferential thickening and pericolonic fat stranding. Small bowel loops are diffusely thick walled and there are distended loops up to 3.5 cm.  Air and fluid noted within colon.  There is a 10.3 x 6.7 cm peripherally enhancing collection within the pelvis, in keeping with an abscess. Vaginal pessary again noted.  Sigmoid colonic diverticulosis.  The bladder is displaced anteriorly.  A smaller peripherally enhancing collection within the right paracolic gutter measures 1.6 x 2.8 x 3.4 cm.  Patent aorta and branch vessels.  No acute osseous finding.  IMPRESSION: Peripherally enhancing collection within the pelvis is concerning for abscess, measuring up to 10.3 x 6.7 cm.  Smaller peripherally enhancing collection within the right paracolic gutter measuring 1.6 x 2.8 cm.  Small bowel loops are distended  and thick walled.  In keeping with a nonspecific enteritis (infectious, inflammatory, and ischemic considerations).  The colon is relatively decompressed distally with circumferential thickening.  Therefore, may reflect a nonspecific colitis.  Small bilateral pleural effusions with associated atelectasis. Small amount of free intraperitoneal fluid measuring water attenuation.  Discussed via telephone with Dr. Deretha Emory at 12:10 a.m. on 01/11/2012   Original Report Authenticated By: Waneta Martins, M.D.    US Venous Img Upper Uni Left  01/14/2012  *RADIOLOGY REPORT*  Clinical Data:  Left arm pain and edema, evaluate for DVT  LEFT UPPER EXTREMITY VENOUS DUPLEX ULTRASOUND  Technique:  Gray-scale sonography with graded compression, as well as color Doppler and duplex ultrasound were performed to evaluate the upper extremity deep venous system from the level of the subclavian vein and including the jugular, axillary, basilic and upper cephalic vein.  Spectral Doppler was utilized to evaluate flow at rest and with distal augmentation maneuvers.  Comparison:  None.  Findings:  Normal compressibility of the upper extremity deep veins is demonstrated.  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  No venous filling defects visualized on grayscale or color Doppler US.  Normal direction of flow is seen throughout the deep veins.  Spectral Doppler waveforms show normal morphology at rest and with  distal augmentation.  The cephalic vein at the level of the wrist is expanded with hypoechoic thrombus and is noncompressible (images 31 and 32).  The cephalic vein appears normally patent at the level of the elbow, however there is partially occlusive thrombus within the cephalic vein at the level of the shoulder (image 36 and 37).  IMPRESSION: 1.  No evidence of deep venous thrombosis within the left upper extremity. 2.   Examination is positive for superficial venous thrombosis within the cephalic vein.    Original Report Authenticated By: Waynard Reeds, M.D.    Dg Chest Port 1 View  01/13/2012  *RADIOLOGY REPORT*  Clinical Data: PICC line placement.  PORTABLE CHEST - 1 VIEW  Comparison: January 10, 2012.  Findings: Cardiomediastinal silhouette appears normal.  Right-sided PICC line is noted with tip in the expected position of the SVC. Minimal subsegmental atelectasis is noted in the right lung base. Left lung is clear.  IMPRESSION: Right-sided PICC line is noted with tip in expected position of the SVC.   Original Report Authenticated By: Venita Sheffield., M.D.     Microbiology: Recent Results (from the past 240 hour(s))  URINE CULTURE     Status: Normal   Collection Time   01/10/12 10:38 PM      Component Value Range Status Comment   Specimen Description URINE, CLEAN CATCH   Final    Special Requests NONE   Final    Culture  Setup Time 01/11/2012 21:42   Final    Colony Count NO GROWTH   Final    Culture NO GROWTH   Final    Report Status 01/12/2012 FINAL   Final   CULTURE, BLOOD (ROUTINE X 2)     Status: Normal   Collection Time   01/11/12  1:11 AM      Component Value Range Status Comment   Specimen Description BLOOD RIGHT ARM   Final    Special Requests BOTTLES DRAWN AEROBIC AND ANAEROBIC 6CC   Final    Culture NO GROWTH 5 DAYS   Final    Report Status 01/16/2012 FINAL   Final   CULTURE, BLOOD (ROUTINE X 2)     Status: Normal   Collection Time   01/11/12  1:12 AM      Component Value Range Status Comment   Specimen Description BLOOD LEFT HAND   Final    Special Requests BOTTLES DRAWN AEROBIC AND ANAEROBIC 6CC   Final    Culture NO GROWTH 5 DAYS   Final    Report Status 01/16/2012 FINAL   Final   MRSA PCR SCREENING     Status: Normal   Collection Time   01/11/12  3:33 AM      Component Value Range Status Comment   MRSA by PCR NEGATIVE  NEGATIVE Final   CULTURE, ROUTINE-ABSCESS     Status: Normal   Collection Time   01/12/12 11:19 AM      Component Value Range Status  Comment   Specimen Description PERITONEAL CAVITY   Final    Special Requests Normal   Final    Gram Stain     Final    Value: ABUNDANT WBC PRESENT, PREDOMINANTLY PMN     NO SQUAMOUS EPITHELIAL CELLS SEEN     ABUNDANT GRAM NEGATIVE RODS     ABUNDANT GRAM POSITIVE COCCI IN PAIRS     IN CHAINS IN CLUSTERS   Culture MODERATE ESCHERICHIA COLI   Final    Report  Status 01/14/2012 FINAL   Final    Organism ID, Bacteria ESCHERICHIA COLI   Final   ANAEROBIC CULTURE     Status: Normal   Collection Time   01/12/12 11:19 AM      Component Value Range Status Comment   Specimen Description PERITONEAL CAVITY   Final    Special Requests Normal   Final    Gram Stain     Final    Value: ABUNDANT WBC PRESENT, PREDOMINANTLY PMN     NO SQUAMOUS EPITHELIAL CELLS SEEN     ABUNDANT GRAM NEGATIVE RODS     ABUNDANT GRAM POSITIVE COCCI IN PAIRS     IN CLUSTERS IN CHAINS   Culture NO ANAEROBES ISOLATED   Final    Report Status 01/17/2012 FINAL   Final   CLOSTRIDIUM DIFFICILE BY PCR     Status: Normal   Collection Time   01/13/12  9:51 PM      Component Value Range Status Comment   C difficile by pcr NEGATIVE  NEGATIVE Final   STOOL CULTURE     Status: Normal   Collection Time   01/13/12  9:51 PM      Component Value Range Status Comment   Specimen Description STOOL   Final    Special Requests NONE   Final    Culture     Final    Value: NO SALMONELLA, SHIGELLA, CAMPYLOBACTER, YERSINIA, OR E.COLI 0157:H7 ISOLATED   Report Status 01/18/2012 FINAL   Final   CLOSTRIDIUM DIFFICILE BY PCR     Status: Normal   Collection Time   01/14/12  5:15 PM      Component Value Range Status Comment   C difficile by pcr NEGATIVE  NEGATIVE Final      Labs: Basic Metabolic Panel:  Lab 01/17/12 9604 01/15/12 0715 01/14/12 0446 01/13/12 0419 01/12/12 0426  NA 140 138 135 135 134*  K 4.5 3.7 4.2 2.8* 3.1*  CL 106 101 102 101 98  CO2 30 32 28 26 24   GLUCOSE 97 101* 116* 100* 59*  BUN 3* 3* 3* 4* 5*  CREATININE 0.48*  0.51 0.48* 0.43* 0.41*  CALCIUM 8.0* 7.8* 7.3* 6.5* 7.2*  MG -- -- -- 1.6 --  PHOS 4.0 4.5 2.0* 1.8* --   Liver Function Tests:  Lab 01/15/12 0715  AST 17  ALT 10  ALKPHOS 82  BILITOT 0.2*  PROT 4.8*  ALBUMIN 1.6*   No results found for this basename: LIPASE:5,AMYLASE:5 in the last 168 hours No results found for this basename: AMMONIA:5 in the last 168 hours CBC:  Lab 01/18/12 0504 01/17/12 1757 01/17/12 0500 01/16/12 0603 01/15/12 0715  WBC 16.3* 16.8* 15.2* 16.5* 16.0*  NEUTROABS -- -- 11.5* -- 11.9*  HGB 8.4* 7.7* 7.6* 8.8* 8.5*  HCT 25.4* 23.0* 22.8* 25.9* 25.1*  MCV 84.7 84.6 84.4 83.5 83.7  PLT 802* 729* 684* 843* 740*   Cardiac Enzymes: No results found for this basename: CKTOTAL:5,CKMB:5,CKMBINDEX:5,TROPONINI:5 in the last 168 hours BNP: BNP (last 3 results) No results found for this basename: PROBNP:3 in the last 8760 hours CBG: No results found for this basename: GLUCAP:5 in the last 168 hours  Time coordinating discharge: greater than 30 minutes  Signed:  Donnita Farina  [facility] 01/18/2012, 11:42 AM

## 2012-01-18 NOTE — Progress Notes (Signed)
Pelvic drainage has become more purulent. Patient to be discharged today. Agree with ciprofloxacin. Home health will help manage the drain. See the patient in my office in one week

## 2012-02-23 ENCOUNTER — Encounter: Payer: Self-pay | Admitting: Gastroenterology

## 2012-02-25 ENCOUNTER — Encounter: Payer: Self-pay | Admitting: Gastroenterology

## 2012-02-25 ENCOUNTER — Telehealth: Payer: Self-pay | Admitting: Gastroenterology

## 2012-02-25 NOTE — Telephone Encounter (Signed)
pt called to cancel new pt appt.  Said she had a previous GI and did no longer have a bowel obstruction.  She would have to get records sent for review if she needed to change GI care.  Pt understands.

## 2012-03-17 ENCOUNTER — Ambulatory Visit: Payer: Medicare Other | Admitting: Gastroenterology

## 2012-03-21 ENCOUNTER — Ambulatory Visit: Payer: Medicare Other | Admitting: Gastroenterology

## 2012-08-03 ENCOUNTER — Telehealth: Payer: Self-pay | Admitting: *Deleted

## 2012-08-03 NOTE — Telephone Encounter (Signed)
Spoke with Shanda Bumps from [facility]. Ibandronate has been approved at tier 1. Valid from 08/02/12-08/02/13. Also, Lansoprazole has been approved at tier 1. Valid from 08/02/12-08/02/13. Alprazolam was denied because no info was submitted that she used a lower tier drug and failed.

## 2012-09-14 ENCOUNTER — Other Ambulatory Visit: Payer: Self-pay

## 2012-09-14 DIAGNOSIS — Z1231 Encounter for screening mammogram for malignant neoplasm of breast: Secondary | ICD-10-CM

## 2012-11-08 ENCOUNTER — Ambulatory Visit
Admission: RE | Admit: 2012-11-08 | Discharge: 2012-11-08 | Disposition: A | Discharge status: Home / Self Care | Payer: Medicare Other | Source: Ambulatory Visit

## 2012-11-08 DIAGNOSIS — Z1231 Encounter for screening mammogram for malignant neoplasm of breast: Secondary | ICD-10-CM

## 2013-02-09 ENCOUNTER — Emergency Department (HOSPITAL_COMMUNITY): Payer: Medicare Other

## 2013-02-09 ENCOUNTER — Encounter (HOSPITAL_COMMUNITY): Payer: Self-pay | Admitting: Emergency Medicine

## 2013-02-09 ENCOUNTER — Inpatient Hospital Stay (HOSPITAL_COMMUNITY)
Admission: EM | Admit: 2013-02-09 | Discharge: 2013-02-13 | DRG: 389 | Disposition: A | Discharge status: Home / Self Care | Payer: Medicare Other | Attending: Internal Medicine | Admitting: Internal Medicine

## 2013-02-09 DIAGNOSIS — E162 Hypoglycemia, unspecified: Secondary | ICD-10-CM

## 2013-02-09 DIAGNOSIS — E860 Dehydration: Secondary | ICD-10-CM | POA: Diagnosis present

## 2013-02-09 DIAGNOSIS — F419 Anxiety disorder, unspecified: Secondary | ICD-10-CM | POA: Diagnosis present

## 2013-02-09 DIAGNOSIS — K565 Intestinal adhesions [bands], unspecified as to partial versus complete obstruction: Principal | ICD-10-CM | POA: Diagnosis present

## 2013-02-09 DIAGNOSIS — E876 Hypokalemia: Secondary | ICD-10-CM

## 2013-02-09 DIAGNOSIS — IMO0002 Reserved for concepts with insufficient information to code with codable children: Secondary | ICD-10-CM

## 2013-02-09 DIAGNOSIS — K56609 Unspecified intestinal obstruction, unspecified as to partial versus complete obstruction: Secondary | ICD-10-CM | POA: Diagnosis present

## 2013-02-09 DIAGNOSIS — Z79899 Other long term (current) drug therapy: Secondary | ICD-10-CM

## 2013-02-09 DIAGNOSIS — K219 Gastro-esophageal reflux disease without esophagitis: Secondary | ICD-10-CM

## 2013-02-09 DIAGNOSIS — F411 Generalized anxiety disorder: Secondary | ICD-10-CM | POA: Diagnosis present

## 2013-02-09 DIAGNOSIS — E46 Unspecified protein-calorie malnutrition: Secondary | ICD-10-CM | POA: Insufficient documentation

## 2013-02-09 HISTORY — DX: Adverse effect of unspecified anesthetic, initial encounter: T41.45XA

## 2013-02-09 HISTORY — DX: Reserved for concepts with insufficient information to code with codable children: IMO0002

## 2013-02-09 HISTORY — DX: Anxiety disorder, unspecified: F41.9

## 2013-02-09 HISTORY — DX: Rectal prolapse: K62.3

## 2013-02-09 HISTORY — DX: Gastro-esophageal reflux disease without esophagitis: K21.9

## 2013-02-09 HISTORY — DX: Unspecified protein-calorie malnutrition: E46

## 2013-02-09 HISTORY — DX: Infection following a procedure, organ and space surgical site, initial encounter: T81.43XA

## 2013-02-09 HISTORY — DX: Peritoneal abscess: K65.1

## 2013-02-09 HISTORY — DX: Other complications of anesthesia, initial encounter: T88.59XA

## 2013-02-09 HISTORY — DX: Menopausal and female climacteric states: N95.1

## 2013-02-09 HISTORY — DX: Unspecified intestinal obstruction, unspecified as to partial versus complete obstruction: K56.609

## 2013-02-09 HISTORY — DX: Spondylosis without myelopathy or radiculopathy, lumbar region: M47.816

## 2013-02-09 HISTORY — DX: Infection following a procedure, other surgical site, initial encounter: T81.49XA

## 2013-02-09 HISTORY — DX: Cystocele, unspecified: N81.10

## 2013-02-09 LAB — CBC WITH DIFFERENTIAL/PLATELET
Basophils Absolute: 0 10*3/uL (ref 0.0–0.1)
Basophils Relative: 0 % (ref 0–1)
Eosinophils Absolute: 0 10*3/uL (ref 0.0–0.7)
Eosinophils Relative: 0 % (ref 0–5)
Lymphs Abs: 1.4 10*3/uL (ref 0.7–4.0)
MCH: 29.5 pg (ref 26.0–34.0)
Neutrophils Relative %: 77 % (ref 43–77)
Platelets: 259 10*3/uL (ref 150–400)
RBC: 4.51 MIL/uL (ref 3.87–5.11)
RDW: 13.5 % (ref 11.5–15.5)
WBC: 9.7 10*3/uL (ref 4.0–10.5)

## 2013-02-09 LAB — BASIC METABOLIC PANEL
Calcium: 11.1 mg/dL — ABNORMAL HIGH (ref 8.4–10.5)
GFR calc Af Amer: >90 mL/min (ref >90)
GFR calc non Af Amer: 87 mL/min — ABNORMAL LOW (ref >90)
Glucose, Bld: 124 mg/dL — ABNORMAL HIGH (ref 70–99)
Potassium: 3.4 mEq/L — ABNORMAL LOW (ref 3.5–5.1)
Sodium: 138 mEq/L (ref 135–145)

## 2013-02-09 LAB — HEPATIC FUNCTION PANEL
ALT: 13 U/L (ref 0–35)
AST: 30 U/L (ref 0–37)
Albumin: 4.5 g/dL (ref 3.5–5.2)
Total Bilirubin: 0.4 mg/dL (ref 0.3–1.2)
Total Protein: 7.9 g/dL (ref 6.0–8.3)

## 2013-02-09 LAB — URINALYSIS, ROUTINE W REFLEX MICROSCOPIC
Nitrite: NEGATIVE
Protein, ur: NEGATIVE mg/dL
Specific Gravity, Urine: 1.021 (ref 1.005–1.030)
Urobilinogen, UA: 0.2 mg/dL (ref 0.0–1.0)

## 2013-02-09 LAB — URINE MICROSCOPIC-ADD ON

## 2013-02-09 MED ORDER — ENOXAPARIN SODIUM 40 MG/0.4ML ~~LOC~~ SOLN
40.0000 mg | SUBCUTANEOUS | Status: DC
  Administered 2013-02-09 – 2013-02-12 (×4): 40 mg via SUBCUTANEOUS
  Filled 2013-02-09 (×5): qty 0.4

## 2013-02-09 MED ORDER — SODIUM CHLORIDE 0.9 % IV SOLN: INTRAVENOUS | Status: DC

## 2013-02-09 MED ORDER — ONDANSETRON HCL 4 MG PO TABS
4.0000 mg | ORAL_TABLET | Freq: Four times a day (QID) | ORAL | Status: DC | PRN
  Administered 2013-02-13: 4 mg via ORAL
  Filled 2013-02-09: qty 1

## 2013-02-09 MED ORDER — POTASSIUM CHLORIDE IN NACL 40-0.9 MEQ/L-% IV SOLN
INTRAVENOUS | Status: DC
  Administered 2013-02-09 – 2013-02-11 (×3): via INTRAVENOUS
  Filled 2013-02-09 (×9): qty 1000

## 2013-02-09 MED ORDER — LORAZEPAM 2 MG/ML IJ SOLN
1.0000 mg | Freq: Once | INTRAMUSCULAR | Status: AC
  Administered 2013-02-09: 1 mg via INTRAVENOUS
  Filled 2013-02-09: qty 1

## 2013-02-09 MED ORDER — MORPHINE SULFATE 4 MG/ML IJ SOLN
4.0000 mg | INTRAMUSCULAR | Status: DC | PRN
  Administered 2013-02-09 – 2013-02-10 (×2): 4 mg via INTRAVENOUS
  Filled 2013-02-09 (×2): qty 1

## 2013-02-09 MED ORDER — IOHEXOL 300 MG/ML  SOLN
100.0000 mL | Freq: Once | INTRAMUSCULAR | Status: AC | PRN
  Administered 2013-02-09: 100 mL via INTRAVENOUS

## 2013-02-09 MED ORDER — IOHEXOL 300 MG/ML  SOLN
50.0000 mL | Freq: Once | INTRAMUSCULAR | Status: AC | PRN
  Administered 2013-02-09: 50 mL via ORAL

## 2013-02-09 MED ORDER — MORPHINE SULFATE 4 MG/ML IJ SOLN
4.0000 mg | Freq: Once | INTRAMUSCULAR | Status: AC
  Administered 2013-02-09: 4 mg via INTRAVENOUS
  Filled 2013-02-09: qty 1

## 2013-02-09 MED ORDER — POTASSIUM CHLORIDE IN NACL 20-0.9 MEQ/L-% IV SOLN
Freq: Once | INTRAVENOUS | Status: AC
  Administered 2013-02-09: 13:00:00 via INTRAVENOUS
  Filled 2013-02-09: qty 1000

## 2013-02-09 MED ORDER — POTASSIUM CHLORIDE IN NACL 20-0.9 MEQ/L-% IV SOLN
Freq: Once | INTRAVENOUS | Status: AC
  Administered 2013-02-09: 16:00:00 via INTRAVENOUS
  Filled 2013-02-09: qty 1000

## 2013-02-09 MED ORDER — ONDANSETRON HCL 4 MG/2ML IJ SOLN
4.0000 mg | Freq: Once | INTRAMUSCULAR | Status: AC
  Administered 2013-02-09: 4 mg via INTRAVENOUS
  Filled 2013-02-09: qty 2

## 2013-02-09 MED ORDER — PANTOPRAZOLE SODIUM 40 MG IV SOLR
40.0000 mg | INTRAVENOUS | Status: DC
  Administered 2013-02-09 – 2013-02-12 (×4): 40 mg via INTRAVENOUS
  Filled 2013-02-09 (×5): qty 40

## 2013-02-09 MED ORDER — VITAMINS A & D EX OINT
TOPICAL_OINTMENT | CUTANEOUS | Status: AC
  Administered 2013-02-09: 1
  Filled 2013-02-09: qty 5

## 2013-02-09 MED ORDER — SODIUM CHLORIDE 0.9 % IV SOLN
12.5000 mg | Freq: Once | INTRAVENOUS | Status: DC
  Filled 2013-02-09: qty 0.5

## 2013-02-09 MED ORDER — ONDANSETRON HCL 4 MG/2ML IJ SOLN
4.0000 mg | Freq: Four times a day (QID) | INTRAMUSCULAR | Status: DC | PRN
  Administered 2013-02-10 – 2013-02-11 (×4): 4 mg via INTRAVENOUS
  Filled 2013-02-09 (×4): qty 2

## 2013-02-09 NOTE — ED Notes (Signed)
Pt states she began to have lower abd pain starting at 0600. Pt states she has thrown up at home. Pt states she has hx of bowel obstruction and abcess a year ago which required surgery. Pt sent by Dr. Dulce Sellar.

## 2013-02-09 NOTE — H&P (Signed)
[facility] History and Physical  [name] HQI:[id number] DOB: 03-Aug-1945 DOA: 02/09/2013  Referring physician: Dr. Raeford Razor PCP: Colette Ribas, MD  GI: Dr. Dulce Sellar  Chief Complaint: Abdominal pain   History of Present Illness: Yvette Branch is an 67 y.o. female with a PMH of bowel obstruction requiring surgical intervention, with post surgical course complicated by bowel abscess (hospitalized for a month) who presents with abdominal pain that began last night, and was similar to the pain she had when she had a bowel obstruction in the past.  Called Dr. Dulce Sellar who advised her to come to the ER for further evaluation.  The patient states her bowels moved yesterday after taking MiraLAX, but she vomited after drinking a Boost.  Pain is crampy and rated a 7/10 at its worst.  Morphine has eased the pain.  Also vomited up some of the CT contrast she drank today.  Upon initial evaluation in the ER, a plain film shows a SBO.  Review of Systems: Constitutional: No fever, + chills, +diaphoresis;  Appetite normal; + weight loss over past year, no weight gain, + fatigue.  HEENT: No blurry vision, no diplopia, no pharyngitis, no dysphagia, + sinus drainage CV: No chest pain, + palpitations, no PND.  Resp: No SOB, no cough, no pleuritic pain. GI: + nausea, + vomiting, no diarrhea, no melena, no hematochezia, no constipation.  GU: No dysuria, no hematuria, no frequency, no urgency. MSK: no myalgias, + diffuse arthralgias.  Neuro:  No headache, no focal neurological deficits, no history of seizures.  Psych: No depression, no anxiety.  Endo: No heat intolerance, no cold intolerance, + polyuria, no polydipsia  Skin: No rashes, no skin lesions.  Heme: No easy bruising.  Travel history: None in past 6 months.  Past Medical History Past Medical History  Diagnosis Date  . Arthritis   . Osteoporosis   . Bowel obstruction   . Abdominal abscess   . Anxiety   . GERD (gastroesophageal reflux  disease)   . Hot flashes, menopausal   . Malnutrition   . Intra-abdominal abscess post-procedure   . Vaginal prolapse   . Rectal prolapse   . Degenerative arthritis of lumbar spine      Past Surgical History Past Surgical History  Procedure Laterality Date  . Abdominal hysterectomy    . Abdominal surgery    . Bowel obstruction    . Tonsillectomy       Social History: History   Social History  . Marital Status: Single    Spouse Name: N/A    Number of Children: 0  . Years of Education: 12   Occupational History  . 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 Smoking status: Never Smoker   . Smokeless tobacco: Not on file  . Alcohol Use: Yes     Comment: Occasional wine.  . Drug Use: No  . Sexual Activity: Yes    Birth Control/ Protection: Surgical   Other Topics Concern  . Not on file   Social History Narrative   Single.  Unmarried, no children.  Lives alone. Independent of ADLs, ambulation.    Family History:  Family History  Problem Relation Age of Onset  . Cancer Mother     Throat  . Cancer Sister     Leukemia  . Cancer Brother     Bladder    Allergies: Review of patient's allergies indicates no known allergies.  Meds: Prior to  Admission medications   Medication Sig Start Date End Date Taking? Authorizing Provider  ALPRAZolam (XANAX) 0.25 MG tablet Take 0.25 mg by mouth 4 (four) times daily as needed. For anxiety   Yes Historical Provider, MD  aspirin EC 81 MG tablet Take 81 mg by mouth daily.   Yes Historical Provider, MD  Biotin 5000 MCG TABS Take 1 tablet by mouth daily.   Yes Historical Provider, MD  Coenzyme Q10 (CO Q 10 PO) Take by mouth.   Yes Historical Provider, MD  Estradiol Acetate (FEMRING) 0.05 MG/24HR RING Place 1 each vaginally every 3 (three) months.   Yes Historical Provider, MD  HYDROcodone-acetaminophen (NORCO/VICODIN) 5-325 MG per tablet Take 1 tablet by mouth every 6 (six) hours as needed. For pain    Yes Historical Provider, MD  ibandronate (BONIVA) 150 MG tablet Take 150 mg by mouth every 30 (thirty) days. Take in the morning with a full glass of water, on an empty stomach, and do not take anything else by mouth or lie down for the next 30 min.   Yes Historical Provider, MD  lansoprazole (PREVACID) 30 MG capsule Take 30 mg by mouth 2 (two) times daily.   Yes Historical Provider, MD  Omega-3 Fatty Acids (FISH OIL) 1200 MG CAPS Take 1 capsule by mouth daily.   Yes Historical Provider, MD  polyethylene glycol (MIRALAX / GLYCOLAX) packet Take 17 g by mouth daily as needed. For constipation 01/18/12  Yes Erick Blinks, MD  Probiotic Product (ALIGN) 4 MG CAPS Take 1 capsule by mouth daily. 01/18/12  Yes Erick Blinks, MD  pyridOXINE (VITAMIN B-6) 100 MG tablet Take 100 mg by mouth daily.   Yes Historical Provider, MD  UNABLE TO FIND Take 1 tablet by mouth 4 (four) times daily. Med Name: OSTEO-SHEATH (ENHANCED BONE SUPPORT-CALCIUM SUPPLEMENT) Includes: Calcium 1221mg  and Vitamin D3 1200IU   Yes Historical Provider, MD  vitamin A 8000 UNIT capsule Take 8,000 Units by mouth daily.   Yes Historical Provider, MD  vitamin C (ASCORBIC ACID) 500 MG tablet Take 500 mg by mouth daily.   Yes Historical Provider, MD  vitamin E 400 UNIT capsule Take 400 Units by mouth daily.   Yes Historical Provider, MD  vitamin k 100 MCG tablet Take 100 mcg by mouth daily.   Yes Historical Provider, MD    Physical Exam: Filed Vitals:   02/09/13 1106  BP: 123/60  Pulse: 74  Temp: 97.7 F (36.5 C)  TempSrc: Oral  Resp: 16  SpO2: 100%     Physical Exam: Blood pressure 123/60, pulse 74, temperature 97.7 F (36.5 C), temperature source Oral, resp. rate 16, SpO2 100.00%. Gen: No acute distress.  Thin. Head: Normocephalic, atraumatic. Eyes: PERRL, EOMI, sclerae nonicteric. Mouth: Oropharynx clear. Dry mucous membranes. Neck: Supple, no thyromegaly, no lymphadenopathy, no jugular venous distention. Chest: Lungs  CTAB. CV: Heart sounds are regular, no M/R/G. Abdomen: Soft, distended, +tenderness but no rebound or guarding. Extremities: Extremities are without C/E/C. Skin: Warm and dry. Neuro: Alert and oriented times 3; cranial nerves II through XII grossly intact. Psych: Mood and affect normal.  Labs on Admission:  Basic Metabolic Panel:  Recent Labs Lab 02/09/13 1145  NA 138  K 3.4*  CL 93*  CO2 33*  GLUCOSE 124*  BUN 21  CREATININE 0.73  CALCIUM 11.1*   CBC:  Recent Labs Lab 02/09/13 1145  WBC 9.7  NEUTROABS 7.5  HGB 13.3  HCT 40.0  MCV 88.7  PLT 259   Cardiac Enzymes:  No results found for this basename: CKTOTAL, CKMB, CKMBINDEX, TROPONINI,  in the last 168 hours  BNP (last 3 results) No results found for this basename: PROBNP,  in the last 8760 hours CBG: No results found for this basename: GLUCAP,  in the last 168 hours  Radiological Exams on Admission: Ct Abdomen Pelvis W Contrast  02/09/2013   CLINICAL DATA:  Abdominal pain. Vomiting.  EXAM: CT ABDOMEN AND PELVIS WITH CONTRAST  TECHNIQUE: Multidetector CT imaging of the abdomen and pelvis was performed using the standard protocol following bolus administration of intravenous contrast.  CONTRAST:  OMNIPAQUE IOHEXOL 300 MG/ML  SOLN  COMPARISON:  01/16/2012 and 02/09/2012.  FINDINGS: Dilated stomach and small bowel consistent with small bowel obstruction which may be caused adhesions. Decompressed colon. The decompressed sigmoid colon has slightly thickened walls and contains diverticulum but without extra luminal bowel inflammatory process, free fluid or free air.  No worrisome hepatic, splenic, pancreatic, adrenal or renal lesion. No calcified gallstone.  No abdominal aortic aneurysm. No adenopathy.  Pessary is in place. Vaginal and rectal prolapse.  Degenerative changes lower thoracic spine. No bony destructive lesion.  IMPRESSION: Dilated stomach and small bowel consistent with small bowel obstruction which may be  caused adhesions.  Please see above.  These results were called by telephone at the time of interpretation on 02/09/2013 at 2:41 PM to Dr. Raeford Razor , who verbally acknowledged these results.   Electronically Signed   By: Bridgett Larsson M.D.   On: 02/09/2013 14:43    Assessment/Plan Principal Problem:   SBO (small bowel obstruction) -Admit and initiate bowel rest/NPO status.  Hydrate. -NG tube to low intermittent suction for gastric decompression. -Monitor electrolytes and replace as needed. -Surgical consultation requested, given history of complications with prior SBO, adhesions. Active Problems:   Hypercalcemia -Hydrate, check ionized calcium level.  May need further work up if ionized calcium is high.   Hypokalemia -Add potassium to IVF.   Malnutrition, calorie -Dietician consult when diet advanced.   Anxiety -Ativan PRN.   GERD (gastroesophageal reflux disease) -IV Protonix daily.  Code Status: Full. Family Communication: No family is at bedside.  Sister is emergency contact. Disposition Plan: Home when stable.  Time spent: 1 hour.  Alisse Tuite [facility] Pager [phone]  If 7PM-7AM, please contact night-coverage [email or web address] Password [email or web address] 02/09/2013, 4:12 PM

## 2013-02-09 NOTE — Consult Note (Signed)
Yvette Branch [birth year]  [id number].   Primary Care MD: Colette Ribas, MD  Requesting MD: Dr Darnelle Catalan Chief Complaint/Reason for Consult: SBO  HPI: This is a 67 y.o. F with a PMH of bowel obstruction and perforation requiring surgical intervention, with post surgical course complicated by bowel abscess (hospitalized for a month) who presents with abdominal pain that began 2 days ago, and was similar to the pain she had when she had a bowel obstruction in the past.   Pt denies fevers or chills.     Review of Systems  Constitutional: Negative for fever and chills.  Eyes: Negative for blurred vision.  Respiratory: Negative for cough.   Cardiovascular: Negative for chest pain.  Gastrointestinal: Positive for nausea and vomiting. Negative for abdominal pain.  Genitourinary: Negative for dysuria, urgency and frequency.  Musculoskeletal: Negative for myalgias.  Skin: Negative for itching and rash.  Neurological: Negative for dizziness and headaches.  Psychiatric/Behavioral: Negative for depression.    Family History  Problem Relation Age of Onset  . Cancer Mother     Throat  . Cancer Sister     Leukemia  . Cancer Brother     Bladder    Past Medical History  Diagnosis Date  . Arthritis   . Osteoporosis   . Bowel obstruction   . Abdominal abscess   . Anxiety   . GERD (gastroesophageal reflux disease)   . Hot flashes, menopausal   . Malnutrition   . Intra-abdominal abscess post-procedure   . Vaginal prolapse   . Rectal prolapse   . Degenerative arthritis of lumbar spine     Past Surgical History  Procedure Laterality Date  . Abdominal hysterectomy    . Abdominal surgery    . Bowel obstruction    . Tonsillectomy      Social History:  reports that she has never smoked. She does not have any smokeless tobacco history on file. She reports that she drinks alcohol. She reports that she does not use illicit drugs.  Allergies: No Known Allergies   (Not in a hospital  admission)  Blood pressure 123/60, pulse 74, temperature 97.7 F (36.5 C), temperature source Oral, resp. rate 16, SpO2 100.00%. Physical Exam: Gen: NAD CV: RRR Lungs: CTA Abd: soft, slightly distended, non-tender NG: draining slightly bilious fluid  Results for orders placed during the hospital encounter of 02/09/13 (from the past 48 hour(s))  CBC WITH DIFFERENTIAL     Status: None   Collection Time    02/09/13 11:45 AM      Result Value Range   WBC 9.7  4.0 - 10.5 K/uL   RBC 4.51  3.87 - 5.11 MIL/uL   Hemoglobin 13.3  12.0 - 15.0 g/dL   HCT 40.9  81.1 - 91.4 %   MCV 88.7  78.0 - 100.0 fL   MCH 29.5  26.0 - 34.0 pg   MCHC 33.3  30.0 - 36.0 g/dL   RDW 78.2  95.6 - 21.3 %   Platelets 259  150 - 400 K/uL   Neutrophils Relative % 77  43 - 77 %   Neutro Abs 7.5  1.7 - 7.7 K/uL   Lymphocytes Relative 15  12 - 46 %   Lymphs Abs 1.4  0.7 - 4.0 K/uL   Monocytes Relative 8  3 - 12 %   Monocytes Absolute 0.8  0.1 - 1.0 K/uL   Eosinophils Relative 0  0 - 5 %   Eosinophils Absolute 0.0  0.0 - 0.7  K/uL   Basophils Relative 0  0 - 1 %   Basophils Absolute 0.0  0.0 - 0.1 K/uL  BASIC METABOLIC PANEL     Status: Abnormal   Collection Time    02/09/13 11:45 AM      Result Value Range   Sodium 138  135 - 145 mEq/L   Potassium 3.4 (*) 3.5 - 5.1 mEq/L   Chloride 93 (*) 96 - 112 mEq/L   CO2 33 (*) 19 - 32 mEq/L   Glucose, Bld 124 (*) 70 - 99 mg/dL   BUN 21  6 - 23 mg/dL   Creatinine, Ser 1.91  0.50 - 1.10 mg/dL   Calcium 47.8 (*) 8.4 - 10.5 mg/dL   GFR calc non Af Amer 87 (*) >90 mL/min   GFR calc Af Amer >90  >90 mL/min   Comment: (NOTE)     The eGFR has been calculated using the CKD EPI equation.     This calculation has not been validated in all clinical situations.     eGFR's persistently <90 mL/min signify possible Chronic Kidney     Disease.  URINALYSIS, ROUTINE W REFLEX MICROSCOPIC     Status: Abnormal   Collection Time    02/09/13 12:10 PM      Result Value Range   Color,  Urine YELLOW  YELLOW   APPearance CLOUDY (*) CLEAR   Specific Gravity, Urine 1.021  1.005 - 1.030   pH 7.5  5.0 - 8.0   Glucose, UA NEGATIVE  NEGATIVE mg/dL   Hgb urine dipstick NEGATIVE  NEGATIVE   Bilirubin Urine NEGATIVE  NEGATIVE   Ketones, ur 15 (*) NEGATIVE mg/dL   Protein, ur NEGATIVE  NEGATIVE mg/dL   Urobilinogen, UA 0.2  0.0 - 1.0 mg/dL   Nitrite NEGATIVE  NEGATIVE   Leukocytes, UA MODERATE (*) NEGATIVE  URINE MICROSCOPIC-ADD ON     Status: Abnormal   Collection Time    02/09/13 12:10 PM      Result Value Range   Squamous Epithelial / LPF MANY (*) RARE   WBC, UA 3-6  <3 WBC/hpf   Urine-Other AMORPHOUS URATES/PHOSPHATES     Ct Abdomen Pelvis W Contrast  02/09/2013   CLINICAL DATA:  Abdominal pain. Vomiting.  EXAM: CT ABDOMEN AND PELVIS WITH CONTRAST  TECHNIQUE: Multidetector CT imaging of the abdomen and pelvis was performed using the standard protocol following bolus administration of intravenous contrast.  CONTRAST:  OMNIPAQUE IOHEXOL 300 MG/ML  SOLN  COMPARISON:  01/16/2012 and 02/09/2012.  FINDINGS: Dilated stomach and small bowel consistent with small bowel obstruction which may be caused adhesions. Decompressed colon. The decompressed sigmoid colon has slightly thickened walls and contains diverticulum but without extra luminal bowel inflammatory process, free fluid or free air.  No worrisome hepatic, splenic, pancreatic, adrenal or renal lesion. No calcified gallstone.  No abdominal aortic aneurysm. No adenopathy.  Pessary is in place. Vaginal and rectal prolapse.  Degenerative changes lower thoracic spine. No bony destructive lesion.  IMPRESSION: Dilated stomach and small bowel consistent with small bowel obstruction which may be caused adhesions.  Please see above.  These results were called by telephone at the time of interpretation on 02/09/2013 at 2:41 PM to Dr. Raeford Razor , who verbally acknowledged these results.   Electronically Signed   By: Bridgett Larsson M.D.    On: 02/09/2013 14:43       Assessment/Plan Yvette Branch is a 67 y.o. F who has a recurrent SBO.  There  appears to be a transition point in the upper pelvis.  NG appears to be working well.  Will monitor for signs of improvement.  If no improvement over the weekend, will most likely need a repeat operation.    Vanita Panda, MD  Colorectal and General Surgery [facility] Surgery

## 2013-02-09 NOTE — ED Provider Notes (Addendum)
CSN: [id number]     Arrival date & time 02/09/13  1045 History   First MD Initiated Contact with Patient 02/09/13 1134     Chief Complaint  Patient presents with  . Abdominal Pain  . Emesis   (Consider location/radiation/quality/duration/timing/severity/associated sxs/prior Treatment) HPI  67 year old female with abdominal pain. Onset around 6:00 this morning. Pain is diffuse, but worse in the lower abdomen. Does not particularly lateralizd. Associated with nausea and vomiting x1. No fevers or chills. No diarrhea. Did have a bowel movement earlier today. S/p hysterectomy. Additionally, past history significant for bowel obstruction require ex-lap by Dr Marlene Bast at [facility] about a year ago. Post-op complicated by abdominal abscess drained by IR. No fevers or chills. No urinary complaints. No unusual vaginal bleeding or discharge.  Past Medical History  Diagnosis Date  . Arthritis   . Osteoporosis   . Bowel obstruction   . Abdominal abscess    Past Surgical History  Procedure Laterality Date  . Abdominal hysterectomy    . Abdominal surgery    . Bowel obstruction     History reviewed. No pertinent family history. History  Substance Use Topics  . Smoking status: Never Smoker   . Smokeless tobacco: Not on file  . Alcohol Use: No   OB History   Grav Para Term Preterm Abortions TAB SAB Ect Mult Living                 Review of Systems   All systems reviewed and negative, other than as noted in HPI.  Allergies  Review of patient's allergies indicates no known allergies.  Home Medications   Current Outpatient Rx  Name  Route  Sig  Dispense  Refill  . ALPRAZolam (XANAX) 0.25 MG tablet   Oral   Take 0.25 mg by mouth 4 (four) times daily as needed. For anxiety         . aspirin EC 81 MG tablet   Oral   Take 81 mg by mouth daily.         . Biotin 5000 MCG TABS   Oral   Take 1 tablet by mouth daily.         . Coenzyme Q10 (CO Q 10 PO)   Oral   Take by  mouth.         . Estradiol Acetate (FEMRING) 0.05 MG/24HR RING   Vaginal   Place 1 each vaginally every 3 (three) months.         Marland Kitchen HYDROcodone-acetaminophen (NORCO/VICODIN) 5-325 MG per tablet   Oral   Take 1 tablet by mouth every 6 (six) hours as needed. For pain         . ibandronate (BONIVA) 150 MG tablet   Oral   Take 150 mg by mouth every 30 (thirty) days. Take in the morning with a full glass of water, on an empty stomach, and do not take anything else by mouth or lie down for the next 30 min.         . lansoprazole (PREVACID) 30 MG capsule   Oral   Take 30 mg by mouth 2 (two) times daily.         . Omega-3 Fatty Acids (FISH OIL) 1200 MG CAPS   Oral   Take 1 capsule by mouth daily.         . polyethylene glycol (MIRALAX / GLYCOLAX) packet   Oral   Take 17 g by mouth daily as needed. For constipation         .  Probiotic Product (ALIGN) 4 MG CAPS   Oral   Take 1 capsule by mouth daily.   30 capsule   0   . pyridOXINE (VITAMIN B-6) 100 MG tablet   Oral   Take 100 mg by mouth daily.         Marland Kitchen UNABLE TO FIND   Oral   Take 1 tablet by mouth 4 (four) times daily. Med Name: OSTEO-SHEATH (ENHANCED BONE SUPPORT-CALCIUM SUPPLEMENT) Includes: Calcium 1221mg  and Vitamin D3 1200IU         . vitamin A 8000 UNIT capsule   Oral   Take 8,000 Units by mouth daily.         . vitamin C (ASCORBIC ACID) 500 MG tablet   Oral   Take 500 mg by mouth daily.         . vitamin E 400 UNIT capsule   Oral   Take 400 Units by mouth daily.         . vitamin k 100 MCG tablet   Oral   Take 100 mcg by mouth daily.          BP 123/60  Pulse 74  Temp(Src) 97.7 F (36.5 C) (Oral)  Resp 16  SpO2 100% Physical Exam  Nursing note and vitals reviewed. Constitutional: She appears well-developed and well-nourished. No distress.  HENT:  Head: Normocephalic and atraumatic.  Eyes: Conjunctivae are normal. Right eye exhibits no discharge. Left eye exhibits no  discharge.  Neck: Neck supple.  Cardiovascular: Normal rate, regular rhythm and normal heart sounds.  Exam reveals no gallop and no friction rub.   No murmur heard. Pulmonary/Chest: Effort normal and breath sounds normal. No respiratory distress.  Abdominal: Soft. She exhibits no distension. There is no tenderness.  Well-healed midline surgical scar. Mild tenderness diffusely. No rebound or guarding. No distension.  Musculoskeletal: She exhibits no edema and no tenderness.  Neurological: She is alert.  Skin: Skin is warm and dry.  Psychiatric: She has a normal mood and affect. Her behavior is normal. Thought content normal.    ED Course  Procedures (including critical care time) Labs Review Labs Reviewed  BASIC METABOLIC PANEL - Abnormal; Notable for the following:    Potassium 3.4 (*)    Chloride 93 (*)    CO2 33 (*)    Glucose, Bld 124 (*)    Calcium 11.1 (*)    GFR calc non Af Amer 87 (*)    All other components within normal limits  URINALYSIS, ROUTINE W REFLEX MICROSCOPIC - Abnormal; Notable for the following:    APPearance CLOUDY (*)    Ketones, ur 15 (*)    Leukocytes, UA MODERATE (*)    All other components within normal limits  URINE MICROSCOPIC-ADD ON - Abnormal; Notable for the following:    Squamous Epithelial / LPF MANY (*)    All other components within normal limits  CBC WITH DIFFERENTIAL   Imaging Review Ct Abdomen Pelvis W Contrast  02/09/2013   CLINICAL DATA:  Abdominal pain. Vomiting.  EXAM: CT ABDOMEN AND PELVIS WITH CONTRAST  TECHNIQUE: Multidetector CT imaging of the abdomen and pelvis was performed using the standard protocol following bolus administration of intravenous contrast.  CONTRAST:  OMNIPAQUE IOHEXOL 300 MG/ML  SOLN  COMPARISON:  01/16/2012 and 02/09/2012.  FINDINGS: Dilated stomach and small bowel consistent with small bowel obstruction which may be caused adhesions. Decompressed colon. The decompressed sigmoid colon has slightly thickened  walls and contains diverticulum but without extra luminal  bowel inflammatory process, free fluid or free air.  No worrisome hepatic, splenic, pancreatic, adrenal or renal lesion. No calcified gallstone.  No abdominal aortic aneurysm. No adenopathy.  Pessary is in place. Vaginal and rectal prolapse.  Degenerative changes lower thoracic spine. No bony destructive lesion.  IMPRESSION: Dilated stomach and small bowel consistent with small bowel obstruction which may be caused adhesions.  Please see above.  These results were called by telephone at the time of interpretation on 02/09/2013 at 2:41 PM to Dr. Raeford Razor , who verbally acknowledged these results.   Electronically Signed   By: Bridgett Larsson M.D.   On: 02/09/2013 14:43    EKG Interpretation   None       MDM   1. SBO (small bowel obstruction)     67 year old female with abdominal pain and nausea/vomiting. CT is significant for a bowel obstruction, most likely secondary to adhesions. N.p.o. IV fluids. NG tube. Admission.    Raeford Razor, MD 02/09/13 1521   4:03 PM Discussed with General Surgery to see pt in consultation.   Raeford Razor, MD 02/09/13 [id number]

## 2013-02-09 NOTE — Progress Notes (Signed)
Utilization Review completed.  Shulem Mader RN CM  

## 2013-02-10 ENCOUNTER — Inpatient Hospital Stay (HOSPITAL_COMMUNITY): Payer: Medicare Other

## 2013-02-10 LAB — CBC
HCT: 38.9 % (ref 36.0–46.0)
HCT: 38.3 % (ref 36.0–46.0)
Hemoglobin: 12.6 g/dL (ref 12.0–15.0)
MCHC: 32.9 g/dL (ref 30.0–36.0)
MCV: 89.8 fL (ref 78.0–100.0)
Platelets: 250 10*3/uL (ref 150–400)
Platelets: 229 10*3/uL (ref 150–400)
RBC: 4.33 MIL/uL (ref 3.87–5.11)
RBC: 4.21 MIL/uL (ref 3.87–5.11)
WBC: 14.7 10*3/uL — ABNORMAL HIGH (ref 4.0–10.5)

## 2013-02-10 LAB — CALCIUM, IONIZED: Calcium, Ion: 1.27 mmol/L (ref 1.13–1.30)

## 2013-02-10 LAB — BASIC METABOLIC PANEL
CO2: 27 mEq/L (ref 19–32)
Calcium: 10.1 mg/dL (ref 8.4–10.5)
Chloride: 103 mEq/L (ref 96–112)
GFR calc Af Amer: >90 mL/min (ref >90)
Sodium: 141 mEq/L (ref 135–145)

## 2013-02-10 MED ORDER — LORAZEPAM 2 MG/ML IJ SOLN
0.5000 mg | Freq: Three times a day (TID) | INTRAMUSCULAR | Status: AC | PRN
  Administered 2013-02-10 – 2013-02-12 (×4): 0.5 mg via INTRAVENOUS
  Filled 2013-02-10 (×4): qty 1

## 2013-02-10 NOTE — Progress Notes (Signed)
SBO (small bowel obstruction)  Subjective:  Tube stopped functioning early this am and pt vomited.  Feels better now that tube is working.  Pain much better  Objective: Vital signs in last 24 hours: Temp:  [97.7 F (36.5 C)-98.1 F (36.7 C)] 98.1 F (36.7 C) (11/01 0525) Pulse Rate:  [69-74] 72 (11/01 0525) Resp:  [16-18] 16 (11/01 0525) BP: (108-123)/(53-61) 112/53 mmHg (11/01 0525) SpO2:  [96 %-100 %] 96 % (11/01 0525) Weight:  [117 lb 1 oz (53.1 kg)] 117 lb 1 oz (53.1 kg) (10/31 1708) Last BM Date: 02/08/13  Intake/Output from previous day: 10/31 0701 - 11/01 0700 In: 1361.7 [I.V.:1351.7; NG/GT:10] Out: 1600 [Urine:200; Emesis/NG output:1400] Intake/Output this shift: Total I/O In: -  Out: 200 [Urine:200]  General appearance: alert and cooperative Cardio: regular rate and rhythm GI: normal findings: soft, non-tender and non-distended.  Very mild tenderness to deep palpation just to right of umbilicus  Lab Results:  Results for orders placed during the hospital encounter of 02/09/13 (from the past 24 hour(s))  CBC WITH DIFFERENTIAL     Status: None   Collection Time    02/09/13 11:45 AM      Result Value Range   WBC 9.7  4.0 - 10.5 K/uL   RBC 4.51  3.87 - 5.11 MIL/uL   Hemoglobin 13.3  12.0 - 15.0 g/dL   HCT 57.8  46.9 - 62.9 %   MCV 88.7  78.0 - 100.0 fL   MCH 29.5  26.0 - 34.0 pg   MCHC 33.3  30.0 - 36.0 g/dL   RDW 52.8  41.3 - 24.4 %   Platelets 259  150 - 400 K/uL   Neutrophils Relative % 77  43 - 77 %   Neutro Abs 7.5  1.7 - 7.7 K/uL   Lymphocytes Relative 15  12 - 46 %   Lymphs Abs 1.4  0.7 - 4.0 K/uL   Monocytes Relative 8  3 - 12 %   Monocytes Absolute 0.8  0.1 - 1.0 K/uL   Eosinophils Relative 0  0 - 5 %   Eosinophils Absolute 0.0  0.0 - 0.7 K/uL   Basophils Relative 0  0 - 1 %   Basophils Absolute 0.0  0.0 - 0.1 K/uL  BASIC METABOLIC PANEL     Status: Abnormal   Collection Time    02/09/13 11:45 AM      Result Value Range   Sodium 138  135 - 145  mEq/L   Potassium 3.4 (*) 3.5 - 5.1 mEq/L   Chloride 93 (*) 96 - 112 mEq/L   CO2 33 (*) 19 - 32 mEq/L   Glucose, Bld 124 (*) 70 - 99 mg/dL   BUN 21  6 - 23 mg/dL   Creatinine, Ser 0.10  0.50 - 1.10 mg/dL   Calcium 27.2 (*) 8.4 - 10.5 mg/dL   GFR calc non Af Amer 87 (*) >90 mL/min   GFR calc Af Amer >90  >90 mL/min  HEPATIC FUNCTION PANEL     Status: None   Collection Time    02/09/13 11:45 AM      Result Value Range   Total Protein 7.9  6.0 - 8.3 g/dL   Albumin 4.5  3.5 - 5.2 g/dL   AST 30  0 - 37 U/L   ALT 13  0 - 35 U/L   Alkaline Phosphatase 45  39 - 117 U/L   Total Bilirubin 0.4  0.3 - 1.2 mg/dL   Bilirubin,  Direct <0.1  0.0 - 0.3 mg/dL   Indirect Bilirubin NOT CALCULATED  0.3 - 0.9 mg/dL  URINALYSIS, ROUTINE W REFLEX MICROSCOPIC     Status: Abnormal   Collection Time    02/09/13 12:10 PM      Result Value Range   Color, Urine YELLOW  YELLOW   APPearance CLOUDY (*) CLEAR   Specific Gravity, Urine 1.021  1.005 - 1.030   pH 7.5  5.0 - 8.0   Glucose, UA NEGATIVE  NEGATIVE mg/dL   Hgb urine dipstick NEGATIVE  NEGATIVE   Bilirubin Urine NEGATIVE  NEGATIVE   Ketones, ur 15 (*) NEGATIVE mg/dL   Protein, ur NEGATIVE  NEGATIVE mg/dL   Urobilinogen, UA 0.2  0.0 - 1.0 mg/dL   Nitrite NEGATIVE  NEGATIVE   Leukocytes, UA MODERATE (*) NEGATIVE  URINE MICROSCOPIC-ADD ON     Status: Abnormal   Collection Time    02/09/13 12:10 PM      Result Value Range   Squamous Epithelial / LPF MANY (*) RARE   WBC, UA 3-6  <3 WBC/hpf   Urine-Other AMORPHOUS URATES/PHOSPHATES    BASIC METABOLIC PANEL     Status: Abnormal   Collection Time    02/10/13  4:00 AM      Result Value Range   Sodium 141  135 - 145 mEq/L   Potassium 3.9  3.5 - 5.1 mEq/L   Chloride 103  96 - 112 mEq/L   CO2 27  19 - 32 mEq/L   Glucose, Bld 112 (*) 70 - 99 mg/dL   BUN 18  6 - 23 mg/dL   Creatinine, Ser 1.61  0.50 - 1.10 mg/dL   Calcium 09.6  8.4 - 04.5 mg/dL   GFR calc non Af Amer 88 (*) >90 mL/min   GFR calc Af  Amer >90  >90 mL/min  CBC     Status: Abnormal   Collection Time    02/10/13  4:00 AM      Result Value Range   WBC 14.7 (*) 4.0 - 10.5 K/uL   RBC 4.33  3.87 - 5.11 MIL/uL   Hemoglobin 12.6  12.0 - 15.0 g/dL   HCT 40.9  81.1 - 91.4 %   MCV 89.8  78.0 - 100.0 fL   MCH 29.1  26.0 - 34.0 pg   MCHC 32.4  30.0 - 36.0 g/dL   RDW 78.2  95.6 - 21.3 %   Platelets 250  150 - 400 K/uL     Studies/Results Radiology  KUB: no massively distended loops of bowel, CT contrast seen within slightly dilated small bowel loops.  Some air in colon and rectum   MEDS, Scheduled . chlorproMAZINE (THORAZINE) IV  12.5 mg Intravenous Once  . enoxaparin (LOVENOX) injection  40 mg Subcutaneous Q24H  . pantoprazole (PROTONIX) IV  40 mg Intravenous Q24H     Assessment: SBO (small bowel obstruction)   Plan: Pt appears clinically better.  Abd very soft, but wbc elevated.  KUB shows no signs of worsening obstruction.  Will repeat CBC as her labs do not fit with her clinical picture.  Patient very fearful of need for any possible surgery.   LOS: 1 day    Vanita Panda, MD [facility] Surgery, [place] [phone]   02/10/2013 10:41 AM

## 2013-02-10 NOTE — Progress Notes (Signed)
[facility] PROGRESS NOTE  Yvette Branch ZOX:[id number] DOB: 06-21-1945 DOA: 02/09/2013 PCP: Colette Ribas, MD  Brief narrative: Yvette Branch is an 67 y.o. female with a PMH of bowel obstruction requiring surgical intervention, with post surgical course complicated by bowel abscess (hospitalized for a month 01/2012) who was admitted on 02/09/2013 with recurrent small bowel obstruction. Surgical consultation performed on 02/09/2013.  Assessment/Plan: Principal Problem:   SBO (small bowel obstruction) The patient was admitted and placed on bowel rest with NG tube gastric decompression. Given her complicated surgical history, prior SBO, and presence of adhesions, a surgical consultation was performed by Dr. Maisie Fus on 02/09/2013. There appears to be a transition point in the upper pelvis. The patient will be continued on conservative therapy with IV fluids, pain and nausea medicines as needed, and if no improvement will likely need surgical exploration. We'll repeat a KUB today to see if any progress has been made. Active Problems:   Hypokalemia Likely from GI losses. Potassium added to IV fluids. The patient's potassium is normal today.   Malnutrition, calorie We'll request a dietitian evaluation when her diet is able to be advanced. She is currently n.p.o.   Anxiety The patient takes Xanax as needed at home. Can give Ativan if she experiences anxiety in the hospital.   GERD (gastroesophageal reflux disease) Continue IV Protonix while n.p.o.   Hypercalcemia Corrected with IV fluids. Hypercalcemia likely was related to dehydration.  Code Status: Full.  Family Communication: No family is at bedside. Sister is emergency contact.  Disposition Plan: Home when stable.  Medical Consultants:  Dr. Romie Levee, Surgery  Other Consultants:  None.  Anti-infectives:  None.  HPI/Subjective: Yvette Branch tells me she had some vomiting overnight when her NG tube got clogged. She  feels better now. Her NG tube is draining bilious material. No flatus or bowel movements since admission.  Objective: Filed Vitals:   02/09/13 1106 02/09/13 1708 02/09/13 2100 02/10/13 0525  BP: 123/60 115/54 108/61 112/53  Pulse: 74 74 69 72  Temp: 97.7 F (36.5 C) 98 F (36.7 C) 98 F (36.7 C) 98.1 F (36.7 C)  TempSrc: Oral Oral Oral Oral  Resp: 16 18 16 16   Height:  5\' 2"  (1.575 m)    Weight:  53.1 kg (117 lb 1 oz)    SpO2: 100% 96% 97% 96%    Intake/Output Summary (Last 24 hours) at 02/10/13 0751 Last data filed at 02/10/13 0600  Gross per 24 hour  Intake 1361.67 ml  Output   1600 ml  Net -238.33 ml    Exam: Gen:  NAD Cardiovascular:  RRR, No M/R/G Respiratory:  Lungs CTAB Gastrointestinal:  Abdomen soft, decreased tenderness, decreased BS Extremities:  No C/E/C  Data Reviewed: Basic Metabolic Panel:  Recent Labs Lab 02/09/13 1145 02/10/13 0400  NA 138 141  K 3.4* 3.9  CL 93* 103  CO2 33* 27  GLUCOSE 124* 112*  BUN 21 18  CREATININE 0.73 0.71  CALCIUM 11.1* 10.1   GFR Estimated Creatinine Clearance: 54.7 ml/min (by C-G formula based on Cr of 0.71). Liver Function Tests:  Recent Labs Lab 02/09/13 1145  AST 30  ALT 13  ALKPHOS 45  BILITOT 0.4  PROT 7.9  ALBUMIN 4.5   CBC:  Recent Labs Lab 02/09/13 1145 02/10/13 0400  WBC 9.7 14.7*  NEUTROABS 7.5  --   HGB 13.3 12.6  HCT 40.0 38.9  MCV 88.7 89.8  PLT 259 250   Microbiology No results  found for this or any previous visit (from the past 240 hour(s)).   Procedures and Diagnostic Studies: Ct Abdomen Pelvis W Contrast  02/09/2013   CLINICAL DATA:  Abdominal pain. Vomiting.  EXAM: CT ABDOMEN AND PELVIS WITH CONTRAST  TECHNIQUE: Multidetector CT imaging of the abdomen and pelvis was performed using the standard protocol following bolus administration of intravenous contrast.  CONTRAST:  OMNIPAQUE IOHEXOL 300 MG/ML  SOLN  COMPARISON:  01/16/2012 and 02/09/2012.  FINDINGS: Dilated  stomach and small bowel consistent with small bowel obstruction which may be caused adhesions. Decompressed colon. The decompressed sigmoid colon has slightly thickened walls and contains diverticulum but without extra luminal bowel inflammatory process, free fluid or free air.  No worrisome hepatic, splenic, pancreatic, adrenal or renal lesion. No calcified gallstone.  No abdominal aortic aneurysm. No adenopathy.  Pessary is in place. Vaginal and rectal prolapse.  Degenerative changes lower thoracic spine. No bony destructive lesion.  IMPRESSION: Dilated stomach and small bowel consistent with small bowel obstruction which may be caused adhesions.  Please see above.  These results were called by telephone at the time of interpretation on 02/09/2013 at 2:41 PM to Dr. Raeford Razor , who verbally acknowledged these results.   Electronically Signed   By: Bridgett Larsson M.D.   On: 02/09/2013 14:43    Scheduled Meds: . chlorproMAZINE (THORAZINE) IV  12.5 mg Intravenous Once  . enoxaparin (LOVENOX) injection  40 mg Subcutaneous Q24H  . pantoprazole (PROTONIX) IV  40 mg Intravenous Q24H   Continuous Infusions: . sodium chloride    . 0.9 % NaCl with KCl 40 mEq / L 100 mL/hr at 02/10/13 9147    Time spent: 35 minutes with > 50% of time discussing current diagnostic test results, clinical impression and plan of care.    LOS: 1 day   Myra Weng  [facility] Pager [phone].   *Please note that the hospitalists switch teams on Wednesdays. Please call the flow manager at [phone] if you are having difficulty reaching the hospitalist taking care of this patient as she can update you and provide the most up-to-date pager number of provider caring for the patient. If 8PM-8AM, please contact night-coverage at [email or web address], password [facility]  02/10/2013, 7:51 AM

## 2013-02-11 LAB — CBC
HCT: 33.8 % — ABNORMAL LOW (ref 36.0–46.0)
Hemoglobin: 10.7 g/dL — ABNORMAL LOW (ref 12.0–15.0)
MCH: 29.2 pg (ref 26.0–34.0)
MCV: 92.3 fL (ref 78.0–100.0)
Platelets: 191 10*3/uL (ref 150–400)
RBC: 3.66 MIL/uL — ABNORMAL LOW (ref 3.87–5.11)
RDW: 14.1 % (ref 11.5–15.5)

## 2013-02-11 LAB — BASIC METABOLIC PANEL
BUN: 20 mg/dL (ref 6–23)
CO2: 23 mEq/L (ref 19–32)
Calcium: 9 mg/dL (ref 8.4–10.5)
Chloride: 107 mEq/L (ref 96–112)
Creatinine, Ser: 0.67 mg/dL (ref 0.50–1.10)
GFR calc Af Amer: >90 mL/min (ref >90)
Glucose, Bld: 70 mg/dL (ref 70–99)
Potassium: 4.2 mEq/L (ref 3.5–5.1)

## 2013-02-11 NOTE — Progress Notes (Signed)
INITIAL NUTRITION ASSESSMENT  DOCUMENTATION CODES Per approved criteria  -Not Applicable   INTERVENTION:  Diet advancement as medically appropriate per MD  Add Boost 1-2 daily once diet advanced  Discussed healthy nutrition and answered patient's questions.  RD to follow   NUTRITION DIAGNOSIS: Inadequate oral intake  related to altered gi function as evidenced by npo status.   Goal: Diet advancement as medically indicated with intake to meet >/=90% estimated needs.  Monitor:  Diet advancement, labs, weight, intake  Reason for Assessment: mst  67 y.o. female  Admitting Dx: SBO (small bowel obstruction)  ASSESSMENT: Patient admitted with SBO.  Clamped NG tube in place and tolerating well.  BM 11/1.    Patient reported last SBO 1 year ago with hospitalization >1 month.  Patient weighed <100 lbs at that time and was diagnosed with severe malnutrition at that time.  Since then patient has been eating well and gaining weight appropriately.    Nutrition Focused Physical Exam:  Subcutaneous Fat:  Orbital Region: mild Upper Arm Region: moderate Thoracic and Lumbar Region: wnl  Muscle:  Temple Region: moderate Clavicle Bone Region: wnl Clavicle and Acromion Bone Region: wnl Scapular Bone Region: wnl Dorsal Hand: wnl Patellar Region: wnl Anterior Thigh Region: wnl Posterior Calf Region: mild  Edema: none   Height: Ht Readings from Last 1 Encounters:  02/09/13 5\' 2"  (1.575 m)    Weight: Wt Readings from Last 1 Encounters:  02/09/13 117 lb 1 oz (53.1 kg)    Ideal Body Weight: 110 lbs107  % Ideal Body Weight: 106  Wt Readings from Last 10 Encounters:  02/09/13 117 lb 1 oz (53.1 kg)  01/14/12 133 lb 13.1 oz (60.7 kg)    Usual Body Weight: 115 lbs for the past 9 months  % Usual Body Weight: 102  BMI:  Body mass index is 21.41 kg/(m^2).  Estimated Nutritional Needs: Kcal: 1400-1500 Protein: 60-70 gm Fluid: 1.4-1.5 L daily  Skin: wnl  Diet Order:  NPO  EDUCATION NEEDS: -Education needs addressed   Intake/Output Summary (Last 24 hours) at 02/11/13 1534 Last data filed at 02/11/13 1300  Gross per 24 hour  Intake   1730 ml  Output   1190 ml  Net    540 ml    Last BM: 10/1  Labs:   Recent Labs Lab 02/09/13 1145 02/10/13 0400 02/11/13 0411  NA 138 141 141  K 3.4* 3.9 4.2  CL 93* 103 107  CO2 33* 27 23  BUN 21 18 20   CREATININE 0.73 0.71 0.67  CALCIUM 11.1* 10.1 9.0  GLUCOSE 124* 112* 70    CBG (last 3)  No results found for this basename: GLUCAP,  in the last 72 hours  Scheduled Meds: . chlorproMAZINE (THORAZINE) IV  12.5 mg Intravenous Once  . enoxaparin (LOVENOX) injection  40 mg Subcutaneous Q24H  . pantoprazole (PROTONIX) IV  40 mg Intravenous Q24H    Continuous Infusions: . 0.9 % NaCl with KCl 40 mEq / L 100 mL/hr at 02/10/13 2956    Past Medical History  Diagnosis Date  . Arthritis   . Osteoporosis   . Bowel obstruction   . Abdominal abscess   . Anxiety   . GERD (gastroesophageal reflux disease)   . Hot flashes, menopausal   . Malnutrition   . Intra-abdominal abscess post-procedure   . Vaginal prolapse   . Rectal prolapse   . Degenerative arthritis of lumbar spine   . Complication of anesthesia     trouble waking  up with colonoscopy2007    Past Surgical History  Procedure Laterality Date  . Abdominal hysterectomy    . Abdominal surgery    . Bowel obstruction    . Tonsillectomy      Oran Rein, RD, LDN Clinical Inpatient Dietitian Pager:  [phone] Weekend and after hours pager:  [phone]

## 2013-02-11 NOTE — Progress Notes (Signed)
[facility] PROGRESS NOTE  TEEGHAN Branch ZOX:[id number] DOB: 07/10/1945 DOA: 02/09/2013 PCP: Colette Ribas, MD  Brief narrative: Yvette Branch is an 67 y.o. female with a PMH of bowel obstruction requiring surgical intervention, with post surgical course complicated by bowel abscess (hospitalized for a month 01/2012) who was admitted on 02/09/2013 with recurrent small bowel obstruction. Surgical consultation performed on 02/09/2013.  Assessment/Plan: Principal Problem:   SBO (small bowel obstruction) The patient was admitted and placed on bowel rest with NG tube gastric decompression. Given her complicated surgical history, prior SBO, and presence of adhesions, a surgical consultation was performed by Dr. Maisie Fus on 02/09/2013. There appears to be a transition point in the upper pelvis. The patient continues to be treated with conservative therapy with IV fluids, pain and nausea medicines as needed, and a repeat a KUB 02/10/2013 showed no improvement. General surgery continues to follow, but so far no plans for surgical intervention. The patient does report having moved her bowels yesterday. Plan to clamp NG tube today and monitor. Repeat KUB in the morning. Active Problems:   Hypokalemia Likely from GI losses. Potassium added to IV fluids. The patient's potassium remains normal.   Malnutrition, calorie We'll request a dietitian evaluation when her diet is able to be advanced. She is currently n.p.o.   Anxiety The patient takes Xanax as needed at home. She has been given Ativan IV as needed for anxiety while in the hospital.   GERD (gastroesophageal reflux disease) Continue IV Protonix while n.p.o.   Hypercalcemia Corrected with IV fluids. Hypercalcemia likely was related to dehydration.  Code Status: Full.  Family Communication: No family is at bedside. Sister is emergency contact.  Disposition Plan: Home when stable.  Medical Consultants:  Dr. Romie Levee, Surgery  Other  Consultants:  None.  Anti-infectives:  None.  HPI/Subjective: Yvette Branch had a loose stool yesterday. No nausea or vomiting. Still having some abdominal soreness.  Objective: Filed Vitals:   02/10/13 0525 02/10/13 1539 02/10/13 2130 02/11/13 0450  BP: 112/53 117/55 101/51 102/58  Pulse: 72 69 71 66  Temp: 98.1 F (36.7 C) 97.6 F (36.4 C) 97.9 F (36.6 C) 98.3 F (36.8 C)  TempSrc: Oral Oral Oral Oral  Resp: 16 16 16 16   Height:      Weight:      SpO2: 96% 98% 97% 99%    Intake/Output Summary (Last 24 hours) at 02/11/13 0743 Last data filed at 02/11/13 0618  Gross per 24 hour  Intake   2360 ml  Output   1690 ml  Net    670 ml    Exam: Gen:  NAD Cardiovascular:  RRR, No M/R/G Respiratory:  Lungs CTAB Gastrointestinal:  Abdomen soft, decreased tenderness, + BS Extremities:  No C/E/C  Data Reviewed: Basic Metabolic Panel:  Recent Labs Lab 02/09/13 1145 02/10/13 0400 02/11/13 0411  NA 138 141 141  K 3.4* 3.9 4.2  CL 93* 103 107  CO2 33* 27 23  GLUCOSE 124* 112* 70  BUN 21 18 20   CREATININE 0.73 0.71 0.67  CALCIUM 11.1* 10.1 9.0   GFR Estimated Creatinine Clearance: 54.7 ml/min (by C-G formula based on Cr of 0.67). Liver Function Tests:  Recent Labs Lab 02/09/13 1145  AST 30  ALT 13  ALKPHOS 45  BILITOT 0.4  PROT 7.9  ALBUMIN 4.5   CBC:  Recent Labs Lab 02/09/13 1145 02/10/13 0400 02/10/13 1120 02/11/13 0411  WBC 9.7 14.7* 12.0* 8.6  NEUTROABS 7.5  --   --   --  HGB 13.3 12.6 12.6 10.7*  HCT 40.0 38.9 38.3 33.8*  MCV 88.7 89.8 91.0 92.3  PLT 259 250 229 191   Microbiology No results found for this or any previous visit (from the past 240 hour(s)).   Procedures and Diagnostic Studies: Ct Abdomen Pelvis W Contrast  02/09/2013   CLINICAL DATA:  Abdominal pain. Vomiting.  EXAM: CT ABDOMEN AND PELVIS WITH CONTRAST  TECHNIQUE: Multidetector CT imaging of the abdomen and pelvis was performed using the standard protocol following  bolus administration of intravenous contrast.  CONTRAST:  OMNIPAQUE IOHEXOL 300 MG/ML  SOLN  COMPARISON:  01/16/2012 and 02/09/2012.  FINDINGS: Dilated stomach and small bowel consistent with small bowel obstruction which may be caused adhesions. Decompressed colon. The decompressed sigmoid colon has slightly thickened walls and contains diverticulum but without extra luminal bowel inflammatory process, free fluid or free air.  No worrisome hepatic, splenic, pancreatic, adrenal or renal lesion. No calcified gallstone.  No abdominal aortic aneurysm. No adenopathy.  Pessary is in place. Vaginal and rectal prolapse.  Degenerative changes lower thoracic spine. No bony destructive lesion.  IMPRESSION: Dilated stomach and small bowel consistent with small bowel obstruction which may be caused adhesions.  Please see above.  These results were called by telephone at the time of interpretation on 02/09/2013 at 2:41 PM to Dr. Raeford Razor , who verbally acknowledged these results.   Electronically Signed   By: Bridgett Larsson M.D.   On: 02/09/2013 14:43   Dg Abd 1 View  02/10/2013   CLINICAL DATA:  Abdominal pain and vomiting  EXAM: ABDOMEN - 1 VIEW  COMPARISON:  CT abdomen/ pelvis 02/09/2013  FINDINGS: Retained contrast is noted within multiple loops of bowel. These are predominantly fluid filled, with mild dilatation measuring 3.7 cm maximally over the left mid abdomen. Presence or absence of air-fluid levels or free air cannot be assessed on this single supine view. Contrast is noted within the otherwise normal-appearing bladder. A pessary is in place. No new abnormal calcific opacity.  IMPRESSION: Allowing for differences in technique, no significant change in multiple fluid/ contrast filled loops of small bowel with mild dilatation, most compatible with small bowel obstruction or less likely ileus.   Electronically Signed   By: Christiana Pellant M.D.   On: 02/10/2013 11:12    Scheduled Meds: . chlorproMAZINE  (THORAZINE) IV  12.5 mg Intravenous Once  . enoxaparin (LOVENOX) injection  40 mg Subcutaneous Q24H  . pantoprazole (PROTONIX) IV  40 mg Intravenous Q24H   Continuous Infusions: . 0.9 % NaCl with KCl 40 mEq / L 100 mL/hr at 02/10/13 4098    Time spent: 25 minutes.    LOS: 2 days   RAMA,CHRISTINA  [facility] Pager [phone].   *Please note that the hospitalists switch teams on Wednesdays. Please call the flow manager at [phone] if you are having difficulty reaching the hospitalist taking care of this patient as she can update you and provide the most up-to-date pager number of provider caring for the patient. If 8PM-8AM, please contact night-coverage at [email or web address], password [facility]  02/11/2013, 7:43 AM

## 2013-02-11 NOTE — Progress Notes (Signed)
SBO (small bowel obstruction)  Subjective: Pt feeling better, c/o some upper abd soreness.  Had a BM.  Denies any flatus.  NG: 741ml/24h  Objective: Vital signs in last 24 hours: Temp:  [97.6 F (36.4 C)-98.3 F (36.8 C)] 98.3 F (36.8 C) (11/02 0450) Pulse Rate:  [66-71] 66 (11/02 0450) Resp:  [16] 16 (11/02 0450) BP: (101-117)/(51-58) 102/58 mmHg (11/02 0450) SpO2:  [97 %-99 %] 99 % (11/02 0450) Last BM Date: 02/09/13  Intake/Output from previous day: 11/01 0701 - 11/02 0700 In: 2360 [I.V.:2330; NG/GT:30] Out: 1690 [Urine:950; Emesis/NG output:740] Intake/Output this shift:    General appearance: alert and cooperative Cardio: regular rate and rhythm GI: normal findings: soft, non-tender and non-distended.  Very mild tenderness to deep palpation just to right of umbilicus NG: dark, bilious output  Lab Results:  Results for orders placed during the hospital encounter of 02/09/13 (from the past 24 hour(s))  CBC     Status: Abnormal   Collection Time    02/10/13 11:20 AM      Result Value Range   WBC 12.0 (*) 4.0 - 10.5 K/uL   RBC 4.21  3.87 - 5.11 MIL/uL   Hemoglobin 12.6  12.0 - 15.0 g/dL   HCT 40.9  81.1 - 91.4 %   MCV 91.0  78.0 - 100.0 fL   MCH 29.9  26.0 - 34.0 pg   MCHC 32.9  30.0 - 36.0 g/dL   RDW 78.2  95.6 - 21.3 %   Platelets 229  150 - 400 K/uL  CBC     Status: Abnormal   Collection Time    02/11/13  4:11 AM      Result Value Range   WBC 8.6  4.0 - 10.5 K/uL   RBC 3.66 (*) 3.87 - 5.11 MIL/uL   Hemoglobin 10.7 (*) 12.0 - 15.0 g/dL   HCT 08.6 (*) 57.8 - 46.9 %   MCV 92.3  78.0 - 100.0 fL   MCH 29.2  26.0 - 34.0 pg   MCHC 31.7  30.0 - 36.0 g/dL   RDW 62.9  52.8 - 41.3 %   Platelets 191  150 - 400 K/uL  BASIC METABOLIC PANEL     Status: Abnormal   Collection Time    02/11/13  4:11 AM      Result Value Range   Sodium 141  135 - 145 mEq/L   Potassium 4.2  3.5 - 5.1 mEq/L   Chloride 107  96 - 112 mEq/L   CO2 23  19 - 32 mEq/L   Glucose, Bld 70  70 -  99 mg/dL   BUN 20  6 - 23 mg/dL   Creatinine, Ser 2.44  0.50 - 1.10 mg/dL   Calcium 9.0  8.4 - 01.0 mg/dL   GFR calc non Af Amer 90 (*) >90 mL/min   GFR calc Af Amer >90  >90 mL/min     Studies/Results Radiology  KUB: no massively distended loops of bowel, CT contrast seen within slightly dilated small bowel loops.  Some air in colon and rectum   MEDS, Scheduled . chlorproMAZINE (THORAZINE) IV  12.5 mg Intravenous Once  . enoxaparin (LOVENOX) injection  40 mg Subcutaneous Q24H  . pantoprazole (PROTONIX) IV  40 mg Intravenous Q24H     Assessment: SBO (small bowel obstruction)   Plan: Pt appears clinically better.  Abd soft, having BM's.  NG output still a little high and dark colored suggesting incomplete resolution.  Will try clamping NG today.  Cont to ambulate as much as possible.     LOS: 2 days    Vanita Panda, MD [facility] Surgery, [place] [phone]   02/11/2013 8:18 AM

## 2013-02-12 ENCOUNTER — Inpatient Hospital Stay (HOSPITAL_COMMUNITY): Payer: Medicare Other

## 2013-02-12 DIAGNOSIS — E162 Hypoglycemia, unspecified: Secondary | ICD-10-CM | POA: Diagnosis not present

## 2013-02-12 DIAGNOSIS — K219 Gastro-esophageal reflux disease without esophagitis: Secondary | ICD-10-CM

## 2013-02-12 DIAGNOSIS — E876 Hypokalemia: Secondary | ICD-10-CM

## 2013-02-12 LAB — GLUCOSE, CAPILLARY
Glucose-Capillary: 45 mg/dL — ABNORMAL LOW (ref 70–99)
Glucose-Capillary: 139 mg/dL — ABNORMAL HIGH (ref 70–99)

## 2013-02-12 LAB — BASIC METABOLIC PANEL
BUN: 17 mg/dL (ref 6–23)
CO2: 19 mEq/L (ref 19–32)
Calcium: 8.5 mg/dL (ref 8.4–10.5)
Chloride: 108 mEq/L (ref 96–112)
Creatinine, Ser: 0.59 mg/dL (ref 0.50–1.10)
GFR calc non Af Amer: >90 mL/min (ref >90)
Glucose, Bld: 57 mg/dL — ABNORMAL LOW (ref 70–99)
Potassium: 4.5 mEq/L (ref 3.5–5.1)

## 2013-02-12 MED ORDER — ALUM & MAG HYDROXIDE-SIMETH 200-200-20 MG/5ML PO SUSP: 30.0000 mL | Freq: Four times a day (QID) | ORAL | Status: DC | PRN

## 2013-02-12 MED ORDER — LIP MEDEX EX OINT
1.0000 | TOPICAL_OINTMENT | Freq: Two times a day (BID) | CUTANEOUS | Status: DC
Start: 2013-02-12 — End: 2013-02-13
  Administered 2013-02-12 (×2): 1 via TOPICAL
  Filled 2013-02-12: qty 7

## 2013-02-12 MED ORDER — LORAZEPAM 0.5 MG PO TABS
0.5000 mg | ORAL_TABLET | Freq: Two times a day (BID) | ORAL | Status: DC | PRN
  Administered 2013-02-12 – 2013-02-13 (×2): 0.5 mg via ORAL
  Filled 2013-02-12 (×2): qty 1

## 2013-02-12 MED ORDER — DEXTROSE 50 % IV SOLN
25.0000 mL | Freq: Once | INTRAVENOUS | Status: AC | PRN
  Administered 2013-02-12: 25 mL via INTRAVENOUS

## 2013-02-12 MED ORDER — MENTHOL 3 MG MT LOZG: 1.0000 | LOZENGE | OROMUCOSAL | Status: DC | PRN

## 2013-02-12 MED ORDER — MAGIC MOUTHWASH
15.0000 mL | Freq: Four times a day (QID) | ORAL | Status: DC | PRN
  Filled 2013-02-12: qty 15

## 2013-02-12 MED ORDER — ACETAMINOPHEN 325 MG PO TABS: 325.0000 mg | ORAL_TABLET | Freq: Four times a day (QID) | ORAL | Status: DC | PRN

## 2013-02-12 MED ORDER — PHENOL 1.4 % MT LIQD: 2.0000 | OROMUCOSAL | Status: DC | PRN

## 2013-02-12 MED ORDER — DEXTROSE-NACL 5-0.9 % IV SOLN
INTRAVENOUS | Status: DC
  Administered 2013-02-12: 980 mL via INTRAVENOUS
  Administered 2013-02-12: 20:00:00 via INTRAVENOUS

## 2013-02-12 MED ORDER — DEXTROSE 50 % IV SOLN
INTRAVENOUS | Status: AC
  Filled 2013-02-12: qty 50

## 2013-02-12 MED ORDER — LACTATED RINGERS IV BOLUS (SEPSIS): 1000.0000 mL | Freq: Three times a day (TID) | INTRAVENOUS | Status: DC | PRN

## 2013-02-12 MED ORDER — BISACODYL 10 MG RE SUPP
10.0000 mg | Freq: Two times a day (BID) | RECTAL | Status: DC | PRN
  Administered 2013-02-12: 10 mg via RECTAL
  Filled 2013-02-12: qty 1

## 2013-02-12 NOTE — Progress Notes (Signed)
Patient had 57 glucose on am labs-CBG 45. Patient is alert and oriented and skin is warm and dry.Patient is NPO and is not diabetic. Hypoglycemia protocol --patient received 25ml of 50% dextrose IV with cbg 139 afterwards. Patient remains alert and oriented with dry skin. Will notify next shift RN and notify MD paged.

## 2013-02-12 NOTE — Progress Notes (Signed)
[facility] PROGRESS NOTE  Yvette Branch WGN:[id number] DOB: October 01, 1945 DOA: 02/09/2013 PCP: Colette Ribas, MD  Brief narrative: Yvette Branch is an 67 y.o. female with a PMH of bowel obstruction requiring surgical intervention, with post surgical course complicated by bowel abscess (hospitalized for a month 01/2012) who was admitted on 02/09/2013 with recurrent small bowel obstruction. Surgical consultation performed on 02/09/2013.  Assessment/Plan: Principal Problem:   SBO (small bowel obstruction) The patient was admitted and placed on bowel rest with NG tube gastric decompression. Given her complicated surgical history, prior SBO, and presence of adhesions, a surgical consultation was performed by Dr. Maisie Fus on 02/09/2013. There appears to be a transition point in the upper pelvis. Treated conservatively. Much improved. Tolerated by mouth clear liquids. I've ordered discontinuation of NG tube and advancing to full liquids Hypoglycemia: Likely secondary to poor by mouth intake. Change IV fluids to D5 normal saline. No evidence of any diabetic issues  Active Problems:   Hypokalemia Likely from GI losses. Potassium added to IV fluids. The patient's potassium remains normal.   Malnutrition, calorie We'll request a dietitian evaluation when her diet is able to be advanced. She is currently n.p.o.   Anxiety The patient takes Xanax as needed at home. She has been given Ativan IV as needed for anxiety while in the hospital.   GERD (gastroesophageal reflux disease) Continue IV Protonix while n.p.o.   Hypercalcemia Corrected with IV fluids. Hypercalcemia likely was related to dehydration.  Code Status: Full.  Family Communication: Left message with family Disposition Plan: Home when stable.  Medical Consultants:  Dr. Romie Levee, Surgery  Other Consultants:  None.  Anti-infectives:  None.  HPI/Subjective: Yvette Branch with positive flatus, positive bowel movement.  Tolerating NG tube clamped. No abdominal pain  Objective: Filed Vitals:   02/11/13 1411 02/11/13 2043 02/11/13 2200 02/12/13 0459  BP: 114/69 100/48 103/60 104/60  Pulse: 66 65  62  Temp: 98.3 F (36.8 C) 98.2 F (36.8 C)  97.6 F (36.4 C)  TempSrc: Oral Oral  Oral  Resp: 16 16  18   Height:      Weight:      SpO2: 97% 98%  99%    Intake/Output Summary (Last 24 hours) at 02/12/13 0841 Last data filed at 02/12/13 0523  Gross per 24 hour  Intake 2286.67 ml  Output   1250 ml  Net 1036.67 ml    Exam: Gen:  NAD Cardiovascular:  RRR, No M/R/G Respiratory:  Lungs CTAB Gastrointestinal:  Abdomen soft, decreased tenderness, + BS Extremities:  No C/E/C  Data Reviewed: Basic Metabolic Panel:  Recent Labs Lab 02/09/13 1145 02/10/13 0400 02/11/13 0411 02/12/13 0420  NA 138 141 141 138  K 3.4* 3.9 4.2 4.5  CL 93* 103 107 108  CO2 33* 27 23 19   GLUCOSE 124* 112* 70 57*  BUN 21 18 20 17   CREATININE 0.73 0.71 0.67 0.59  CALCIUM 11.1* 10.1 9.0 8.5   GFR Estimated Creatinine Clearance: 54.7 ml/min (by C-G formula based on Cr of 0.59). Liver Function Tests:  Recent Labs Lab 02/09/13 1145  AST 30  ALT 13  ALKPHOS 45  BILITOT 0.4  PROT 7.9  ALBUMIN 4.5   CBC:  Recent Labs Lab 02/09/13 1145 02/10/13 0400 02/10/13 1120 02/11/13 0411  WBC 9.7 14.7* 12.0* 8.6  NEUTROABS 7.5  --   --   --   HGB 13.3 12.6 12.6 10.7*  HCT 40.0 38.9 38.3 33.8*  MCV 88.7 89.8 91.0 92.3  PLT 259 250 229 191   Microbiology No results found for this or any previous visit (from the past 240 hour(s)).   Procedures and Diagnostic Studies: Ct Abdomen Pelvis W Contrast  02/09/2013   CLINICAL DATA:  Abdominal pain. Vomiting.  EXAM: CT ABDOMEN AND PELVIS WITH CONTRAST  TECHNIQUE: Multidetector CT imaging of the abdomen and pelvis was performed using the standard protocol following bolus administration of intravenous contrast.  CONTRAST:  OMNIPAQUE IOHEXOL 300 MG/ML  SOLN   COMPARISON:  01/16/2012 and 02/09/2012.  FINDINGS: Dilated stomach and small bowel consistent with small bowel obstruction which may be caused adhesions. Decompressed colon. The decompressed sigmoid colon has slightly thickened walls and contains diverticulum but without extra luminal bowel inflammatory process, free fluid or free air.  No worrisome hepatic, splenic, pancreatic, adrenal or renal lesion. No calcified gallstone.  No abdominal aortic aneurysm. No adenopathy.  Pessary is in place. Vaginal and rectal prolapse.  Degenerative changes lower thoracic spine. No bony destructive lesion.  IMPRESSION: Dilated stomach and small bowel consistent with small bowel obstruction which may be caused adhesions.  Please see above.  These results were called by telephone at the time of interpretation on 02/09/2013 at 2:41 PM to Dr. Raeford Razor , who verbally acknowledged these results.   Electronically Signed   By: Bridgett Larsson M.D.   On: 02/09/2013 14:43   Dg Abd 1 View  02/10/2013   CLINICAL DATA:  Abdominal pain and vomiting  EXAM: ABDOMEN - 1 VIEW  COMPARISON:  CT abdomen/ pelvis 02/09/2013  FINDINGS: Retained contrast is noted within multiple loops of bowel. These are predominantly fluid filled, with mild dilatation measuring 3.7 cm maximally over the left mid abdomen. Presence or absence of air-fluid levels or free air cannot be assessed on this single supine view. Contrast is noted within the otherwise normal-appearing bladder. A pessary is in place. No new abnormal calcific opacity.  IMPRESSION: Allowing for differences in technique, no significant change in multiple fluid/ contrast filled loops of small bowel with mild dilatation, most compatible with small bowel obstruction or less likely ileus.   Electronically Signed   By: Christiana Pellant M.D.   On: 02/10/2013 11:12    Scheduled Meds: . chlorproMAZINE (THORAZINE) IV  12.5 mg Intravenous Once  . enoxaparin (LOVENOX) injection  40 mg Subcutaneous Q24H   . pantoprazole (PROTONIX) IV  40 mg Intravenous Q24H   Continuous Infusions: . 0.9 % NaCl with KCl 40 mEq / L 100 mL/hr at 02/11/13 2204    Time spent: 15 minutes.    LOS: 3 days   Hollice Espy  [facility] Pager [phone].   *Please note that the hospitalists switch teams on Wednesdays. Please call the flow manager at [phone] if you are having difficulty reaching the hospitalist taking care of this patient as she can update you and provide the most up-to-date pager number of provider caring for the patient. If 8PM-8AM, please contact night-coverage at [email or web address], password [facility]  02/12/2013, 8:41 AM

## 2013-02-12 NOTE — Progress Notes (Signed)
Yvette Branch [id number] [birth year]  CARE TEAM:  PCP: Colette Ribas, MD  Outpatient Care Team: Patient Care Team: Corrie Mckusick, MD as PCP - General (Family Medicine)  Inpatient Treatment Team: Treatment Team: Attending Provider: Hollice Espy, MD; Rounding Team: Alton Revere, MD; Consulting Physician: Bishop Limbo, MD; Technician: Betsey Holiday, NT; Registered Nurse: Eugene Garnet, RN; Technician: Gwynneth Albright, NT; Registered Nurse: York Grice, RN   Subjective:  Feeling better ++flatus Walking more Going to Xray  Objective:  Vital signs:  Filed Vitals:   02/11/13 1411 02/11/13 2043 02/11/13 2200 02/12/13 0459  BP: 114/69 100/48 103/60 104/60  Pulse: 66 65  62  Temp: 98.3 F (36.8 C) 98.2 F (36.8 C)  97.6 F (36.4 C)  TempSrc: Oral Oral  Oral  Resp: 16 16  18   Height:      Weight:      SpO2: 97% 98%  99%    Last BM Date: 02/10/13  Intake/Output   Yesterday:  11/02 0701 - 11/03 0700 In: 2286.7 [I.V.:2286.7] Out: 1250 [Urine:850; Emesis/NG output:400] This shift:     Bowel function:  Flatus: y  BM: n  Drain: NGT scant bilious  Physical Exam:  General: Pt awake/alert/oriented x4 in no acute distress Eyes: PERRL, normal EOM.  Sclera clear.  No icterus Neuro: CN II-XII intact w/o focal sensory/motor deficits. Lymph: No head/neck/groin lymphadenopathy Psych:  No delerium/psychosis/paranoia HENT: Normocephalic, Mucus membranes moist.  No thrush Neck: Supple, No tracheal deviation Chest: No chest wall pain w good excursion CV:  Pulses intact.  Regular rhythm MS: Normal AROM mjr joints.  No obvious deformity Abdomen: Soft.  Nondistended.  Nontender.  No evidence of peritonitis.  No incarcerated hernias. Ext:  SCDs BLE.  No mjr edema.  No cyanosis Skin: No petechiae / purpura   Problem List:   Principal Problem:   SBO (small bowel obstruction) Active Problems:   Hypokalemia   Malnutrition, calorie   Anxiety   GERD  (gastroesophageal reflux disease)   Hypercalcemia   Assessment  Yvette Branch  67 y.o. female       PSBO (most likely due to adhesions) resolving  Plan:  -try PO liquids - if tolerates w NGT clamped, OK to d/c NGT later to day & Adv to fulls as tolerated The patient is stable.  There is no evidence of peritonitis, acute abdomen, nor shock.  There is no strong evidence of failure of improvement nor decline with current non-operative management.  There is no need for surgery at the present moment.  She wishes to avoid surgery if possible.  We will continue to follow.  -wean IVF as PO tolerance improved -VTE prophylaxis- SCDs, etc -mobilize as tolerated to help recovery  Ardeth Sportsman, M.D., F.A.C.S. Gastrointestinal and Minimally Invasive Surgery [facility] [address] [place], [place] [address] [phone] Main / Paging   02/12/2013   Results:   Labs: Results for orders placed during the hospital encounter of 02/09/13 (from the past 48 hour(s))  CBC     Status: Abnormal   Collection Time    02/10/13 11:20 AM      Result Value Range   WBC 12.0 (*) 4.0 - 10.5 K/uL   RBC 4.21  3.87 - 5.11 MIL/uL   Hemoglobin 12.6  12.0 - 15.0 g/dL   HCT 86.5  78.4 - 69.6 %   MCV 91.0  78.0 - 100.0 fL   MCH 29.9  26.0 - 34.0 pg   MCHC 32.9  30.0 - 36.0 g/dL   RDW 16.1  09.6 - 04.5 %   Platelets 229  150 - 400 K/uL  CBC     Status: Abnormal   Collection Time    02/11/13  4:11 AM      Result Value Range   WBC 8.6  4.0 - 10.5 K/uL   RBC 3.66 (*) 3.87 - 5.11 MIL/uL   Hemoglobin 10.7 (*) 12.0 - 15.0 g/dL   HCT 40.9 (*) 81.1 - 91.4 %   MCV 92.3  78.0 - 100.0 fL   MCH 29.2  26.0 - 34.0 pg   MCHC 31.7  30.0 - 36.0 g/dL   RDW 78.2  95.6 - 21.3 %   Platelets 191  150 - 400 K/uL  BASIC METABOLIC PANEL     Status: Abnormal   Collection Time    02/11/13  4:11 AM      Result Value Range   Sodium 141  135 - 145 mEq/L   Potassium 4.2  3.5 - 5.1  mEq/L   Chloride 107  96 - 112 mEq/L   CO2 23  19 - 32 mEq/L   Glucose, Bld 70  70 - 99 mg/dL   BUN 20  6 - 23 mg/dL   Creatinine, Ser 0.86  0.50 - 1.10 mg/dL   Calcium 9.0  8.4 - 57.8 mg/dL   GFR calc non Af Amer 90 (*) >90 mL/min   GFR calc Af Amer >90  >90 mL/min   Comment: (NOTE)     The eGFR has been calculated using the CKD EPI equation.     This calculation has not been validated in all clinical situations.     eGFR's persistently <90 mL/min signify possible Chronic Kidney     Disease.  BASIC METABOLIC PANEL     Status: Abnormal   Collection Time    02/12/13  4:20 AM      Result Value Range   Sodium 138  135 - 145 mEq/L   Potassium 4.5  3.5 - 5.1 mEq/L   Chloride 108  96 - 112 mEq/L   CO2 19  19 - 32 mEq/L   Glucose, Bld 57 (*) 70 - 99 mg/dL   BUN 17  6 - 23 mg/dL   Creatinine, Ser 4.69  0.50 - 1.10 mg/dL   Calcium 8.5  8.4 - 62.9 mg/dL   GFR calc non Af Amer >90  >90 mL/min   GFR calc Af Amer >90  >90 mL/min   Comment: (NOTE)     The eGFR has been calculated using the CKD EPI equation.     This calculation has not been validated in all clinical situations.     eGFR's persistently <90 mL/min signify possible Chronic Kidney     Disease.    Imaging / Studies: Dg Abd 1 View  02/10/2013   CLINICAL DATA:  Abdominal pain and vomiting  EXAM: ABDOMEN - 1 VIEW  COMPARISON:  CT abdomen/ pelvis 02/09/2013  FINDINGS: Retained contrast is noted within multiple loops of bowel. These are predominantly fluid filled, with mild dilatation measuring 3.7 cm maximally over the left mid abdomen. Presence or absence of air-fluid levels or free air cannot be assessed on this single supine view. Contrast is noted within the otherwise normal-appearing bladder. A pessary is in place. No new abnormal calcific opacity.  IMPRESSION: Allowing for differences in technique, no significant change in multiple fluid/ contrast filled loops of  small bowel with mild dilatation, most compatible with small bowel  obstruction or less likely ileus.   Electronically Signed   By: Christiana Pellant M.D.   On: 02/10/2013 11:12    Medications / Allergies: per chart  Antibiotics: Anti-infectives   None

## 2013-02-13 DIAGNOSIS — F411 Generalized anxiety disorder: Secondary | ICD-10-CM

## 2013-02-13 LAB — BASIC METABOLIC PANEL
CO2: 25 mEq/L (ref 19–32)
Chloride: 110 mEq/L (ref 96–112)
Creatinine, Ser: 0.6 mg/dL (ref 0.50–1.10)
GFR calc non Af Amer: >90 mL/min (ref >90)
Potassium: 4 mEq/L (ref 3.5–5.1)
Sodium: 141 mEq/L (ref 135–145)

## 2013-02-13 MED ORDER — ALUM & MAG HYDROXIDE-SIMETH 200-200-20 MG/5ML PO SUSP: 30.0000 mL | Freq: Four times a day (QID) | ORAL | Status: DC | PRN

## 2013-02-13 NOTE — Progress Notes (Signed)
Pt. Was discharged home. She was given her discharge instructions and all questions were answered.  She was transported home by a friend.

## 2013-02-13 NOTE — Progress Notes (Signed)
  Subjective: Pt feels good this am.  Abdomen mildly tender from the lovenox shots.  Pt had 2 BM's yesterday.  Tolerating full liquids.  Ambulating well.  Wants to go home.  Asks multiple questions about diet upon discharge.  Objective: Vital signs in last 24 hours: Temp:  [97.4 F (36.3 C)-97.6 F (36.4 C)] 97.4 F (36.3 C) (11/04 0438) Pulse Rate:  [56-60] 60 (11/04 0438) Resp:  [16-18] 16 (11/04 0438) BP: (92-110)/(47-60) 110/60 mmHg (11/04 0438) SpO2:  [97 %-100 %] 97 % (11/04 0438) Last BM Date: 02/12/13  Intake/Output from previous day: 11/03 0701 - 11/04 0700 In: 2008.3 [I.V.:2008.3] Out: 800 [Urine:800] Intake/Output this shift:    PE: Gen:  Alert, NAD, pleasant Abd: Soft, NT/ND, +BS, no HSM, lower midline scar well healed   Lab Results:   Recent Labs  02/10/13 1120 02/11/13 0411  WBC 12.0* 8.6  HGB 12.6 10.7*  HCT 38.3 33.8*  PLT 229 191   BMET  Recent Labs  02/12/13 0420 02/13/13 0310  NA 138 141  K 4.5 4.0  CL 108 110  CO2 19 25  GLUCOSE 57* 112*  BUN 17 7  CREATININE 0.59 0.60  CALCIUM 8.5 7.8*   PT/INR No results found for this basename: LABPROT, INR,  in the last 72 hours CMP     Component Value Date/Time   NA 141 02/13/2013 0310   K 4.0 02/13/2013 0310   CL 110 02/13/2013 0310   CO2 25 02/13/2013 0310   GLUCOSE 112* 02/13/2013 0310   BUN 7 02/13/2013 0310   CREATININE 0.60 02/13/2013 0310   CALCIUM 7.8* 02/13/2013 0310   PROT 7.9 02/09/2013 1145   ALBUMIN 4.5 02/09/2013 1145   AST 30 02/09/2013 1145   ALT 13 02/09/2013 1145   ALKPHOS 45 02/09/2013 1145   BILITOT 0.4 02/09/2013 1145   GFRNONAA >90 02/13/2013 0310   GFRAA >90 02/13/2013 0310   Lipase     Component Value Date/Time   LIPASE 64* 01/10/2012 2152       Studies/Results: Dg Abd 1 View  02/12/2013   CLINICAL DATA:  Small bowel obstruction. Abdominal pain. Decrease nausea.  EXAM: ABDOMEN - 1 VIEW  COMPARISON:  02/10/2013 and CT of 02/09/2013  FINDINGS: Nasogastric tube  which terminates at did distal stomach. CONTRAST which is passed into the colon. Overall improvement and near resolution of small bowel dilatation. A small bowel loop measures 3.2 cm on the right side of the abdomen. Distal gas and stool. No pneumatosis or free intraperitoneal air.  IMPRESSION: Improved to resolved small-bowel obstruction pattern.  Nasogastric tube terminating at the distal stomach.   Electronically Signed   By: Jeronimo Greaves M.D.   On: 02/12/2013 09:00    Anti-infectives: Anti-infectives   None       Assessment/Plan pSBO (likely secondary to adhesions from multiple surgeries) - resolving 1.  Advance diet to D3 diet 2.  Ambulate and IS 3.  SCD's and lovenox 4.  Home when tolerating soft diet, likely today? 5.  No specific f/u needed, continue good bowel regimen 6.  Discussed reasons for returning to the ER for worsening symptoms    LOS: 4 days    DORT, Laray Rivkin 02/13/2013, 7:40 AM Pager: [phone]

## 2013-02-13 NOTE — Care Management Note (Signed)
No needs identified by pt or MD. PCP: Colette Ribas, MD.  Cm to sign off.   Roxy Manns Suha Schoenbeck,RN,MSN [phone]

## 2013-02-13 NOTE — Discharge Summary (Signed)
Physician Discharge Summary  Yvette Branch ZOX:[id number] DOB: 20-Jan-1946 DOA: 02/09/2013  PCP: Colette Ribas, MD  Admit date: 02/09/2013 Discharge date: 02/13/2013  Time spent: 25 minutes  Recommendations for Outpatient Follow-up:  1. Patient will followup with her PCP in the next one month  Discharge Diagnoses:  Principal Problem:   SBO (small bowel obstruction) Active Problems:   Hypokalemia   Malnutrition, calorie   Anxiety   GERD (gastroesophageal reflux disease)   Hypercalcemia   Hypoglycemia   Discharge Condition: Improved, being discharged home  Diet recommendation: Regular  Filed Weights   02/09/13 1708  Weight: 53.1 kg (117 lb 1 oz)    History of present illness:  Yvette Branch is an 67 y.o. female with a PMH of bowel obstruction requiring surgical intervention, with post surgical course complicated by bowel abscess (hospitalized for a month 01/2012) who was admitted on 02/09/2013 with recurrent small bowel obstruction. Surgical consultation performed on 02/09/2013.   Hospital Course:  Principal Problem:   SBO (small bowel obstruction): Patient was admitted and placed on bowel rest. NG tube was placed for gastric decompression. Surgery consult was done given the complicated surgical history, prior small bowel obstruction and presence of adhesions. There appeared to be a transition point in the upper pelvis. Patient responded well to conservative therapy with IV fluids and pain and nausea control. NG tube was clamped on 11/2 and KUB done 11/3 noted near resolution of bowel obstruction. Patient started having positive flatus. Her diet was started and advanced and by 11/4, she was tolerating solid food. Her NG tube was discontinued the day prior. Surgery has cleared her to be discharged following toleration of diet. Active Problems:   Hypokalemia: Likely from GI losses. Potassium added to IV fluids and levels remained normal.    Malnutrition, calorie   Anxiety:  Put on when necessary IV Ativan. Patient will resume her normal home dose of when necessary Xanax.    GERD (gastroesophageal reflux disease): On IV Protonix while n.p.o.    Hypercalcemia : Secondary to dehydration. Corrected IV fluids.    Hypoglycemia: Secondary to lack of by mouth intake. Dextrose was added to her IV fluids. When she has been on by mouth, sugars remained normal  Procedures:  None  Consultations:  [facility]  Discharge Exam: Filed Vitals:   02/13/13 0438  BP: 110/60  Pulse: 60  Temp: 97.4 F (36.3 C)  Resp: 16    General: Alert and oriented x3, no acute distress Cardiovascular: Regular rate and rhythm, S1-S2 Respiratory: Clear to auscultation bilaterally Abdomen: Soft, nontender, nondistended, positive bowel sounds  Discharge Instructions     Medication List         ALIGN 4 MG Caps  Take 1 capsule by mouth daily.     ALPRAZolam 0.25 MG tablet  Commonly known as:  XANAX  Take 0.25 mg by mouth 4 (four) times daily as needed. For anxiety     alum & mag hydroxide-simeth 200-200-20 MG/5ML suspension  Commonly known as:  MAALOX/MYLANTA  Take 30 mLs by mouth every 6 (six) hours as needed for indigestion or heartburn (or bloating).     aspirin EC 81 MG tablet  Take 81 mg by mouth daily.     Biotin 5000 MCG Tabs  Take 1 tablet by mouth daily.     CO Q 10 PO  Take by mouth.     FEMRING 0.05 MG/24HR Ring  Generic drug:  Estradiol Acetate  Place 1 each vaginally every  3 (three) months.     Fish Oil 1200 MG Caps  Take 1 capsule by mouth daily.     HYDROcodone-acetaminophen 5-325 MG per tablet  Commonly known as:  NORCO/VICODIN  Take 1 tablet by mouth every 6 (six) hours as needed. For pain     ibandronate 150 MG tablet  Commonly known as:  BONIVA  Take 150 mg by mouth every 30 (thirty) days. Take in the morning with a full glass of water, on an empty stomach, and do not take anything else by mouth or lie down for the next 30  min.     lansoprazole 30 MG capsule  Commonly known as:  PREVACID  Take 30 mg by mouth 2 (two) times daily.     polyethylene glycol packet  Commonly known as:  MIRALAX / GLYCOLAX  Take 17 g by mouth daily as needed. For constipation     pyridOXINE 100 MG tablet  Commonly known as:  VITAMIN B-6  Take 100 mg by mouth daily.     UNABLE TO FIND  Take 1 tablet by mouth 4 (four) times daily. Med Name: OSTEO-SHEATH (ENHANCED BONE SUPPORT-CALCIUM SUPPLEMENT) Includes: Calcium 1221mg  and Vitamin D3 1200IU     vitamin A 8000 UNIT capsule  Take 8,000 Units by mouth daily.     vitamin C 500 MG tablet  Commonly known as:  ASCORBIC ACID  Take 500 mg by mouth daily.     vitamin E 400 UNIT capsule  Take 400 Units by mouth daily.     vitamin k 100 MCG tablet  Take 100 mcg by mouth daily.       No Known Allergies     Follow-up Information   Follow up with Colette Ribas, MD In 1 month.   Specialty:  Family Medicine   Contact information:   [address] [place] [address] [phone]        The results of significant diagnostics from this hospitalization (including imaging, microbiology, ancillary and laboratory) are listed below for reference.    Significant Diagnostic Studies: Dg Abd 1 View  02/12/2013   IMPRESSION: Improved to resolved small-bowel obstruction pattern.  Nasogastric tube terminating at the distal stomach.   Electronically Signed   By: Jeronimo Greaves M.D.   On: 02/12/2013 09:00   Dg Abd 1 View  02/10/2013  IMPRESSION: Allowing for differences in technique, no significant change in multiple fluid/ contrast filled loops of small bowel with mild dilatation, most compatible with small bowel obstruction or less likely ileus.   Electronically Signed   By: Christiana Pellant M.D.   On: 02/10/2013 11:12   Ct Abdomen Pelvis W Contrast  02/09/2013   IMPRESSION: Dilated stomach and small bowel consistent with small bowel obstruction which  may be caused adhesions.  Please see above.  These results were called by telephone at the time of interpretation on 02/09/2013 at 2:41 PM to Dr. Raeford Razor , who verbally acknowledged these results.   Electronically Signed   By: Bridgett Larsson M.D.   On: 02/09/2013 14:43    Microbiology: No results found for this or any previous visit (from the past 240 hour(s)).   Labs: Basic Metabolic Panel:  Recent Labs Lab 02/09/13 1145 02/10/13 0400 02/11/13 0411 02/12/13 0420 02/13/13 0310  NA 138 141 141 138 141  K 3.4* 3.9 4.2 4.5 4.0  CL 93* 103 107 108 110  CO2 33* 27 23 19 25   GLUCOSE 124* 112*  70 57* 112*  BUN 21 18 20 17 7   CREATININE 0.73 0.71 0.67 0.59 0.60  CALCIUM 11.1* 10.1 9.0 8.5 7.8*   Liver Function Tests:  Recent Labs Lab 02/09/13 1145  AST 30  ALT 13  ALKPHOS 45  BILITOT 0.4  PROT 7.9  ALBUMIN 4.5   CBC:  Recent Labs Lab 02/09/13 1145 02/10/13 0400 02/10/13 1120 02/11/13 0411  WBC 9.7 14.7* 12.0* 8.6  NEUTROABS 7.5  --   --   --   HGB 13.3 12.6 12.6 10.7*  HCT 40.0 38.9 38.3 33.8*  MCV 88.7 89.8 91.0 92.3  PLT 259 250 229 191   CBG:  Recent Labs Lab 02/12/13 0717 02/12/13 0749  GLUCAP 45* 139*       Signed:  Shatasha Lambing K  [facility] 02/13/2013, 11:05 AM

## 2013-02-13 NOTE — Progress Notes (Signed)
D/C patient from hospital when patient meets criteria (anticipate in 0-1 day(s)):  Tolerating oral intake well Ambulating in walkways Adequate pain control without IV medications Urinating  Having flatus  Recommend low-residue diet for the first few days then transitioned to a low fat high fiber diet.  Ambulate outside an hour a day.  Follow up with surgery as needed.

## 2013-06-06 ENCOUNTER — Other Ambulatory Visit: Payer: Self-pay | Admitting: Obstetrics & Gynecology

## 2013-06-06 ENCOUNTER — Encounter (INDEPENDENT_AMBULATORY_CARE_PROVIDER_SITE_OTHER): Payer: Self-pay

## 2013-06-06 ENCOUNTER — Ambulatory Visit (INDEPENDENT_AMBULATORY_CARE_PROVIDER_SITE_OTHER): Payer: Medicare Other | Admitting: Obstetrics & Gynecology

## 2013-06-06 ENCOUNTER — Encounter: Payer: Self-pay | Admitting: Obstetrics & Gynecology

## 2013-06-06 VITALS — BP 100/60 | Ht 62.0 in | Wt 116.0 lb

## 2013-06-06 DIAGNOSIS — N760 Acute vaginitis: Secondary | ICD-10-CM | POA: Insufficient documentation

## 2013-06-06 MED ORDER — METRONIDAZOLE 0.75 % VA GEL: VAGINAL | Status: DC

## 2013-06-06 NOTE — Progress Notes (Signed)
Patient ID: Yvette Branch, female   DOB: Oct 30, 1945, 68 y.o.   MRN: [id number] Pt with burning discharge for a couple of weeks  Exam Yellowish discharge  Wet prep +BV metro gel vaginal

## 2013-06-12 ENCOUNTER — Telehealth: Payer: Self-pay | Admitting: *Deleted

## 2013-06-12 NOTE — Telephone Encounter (Signed)
Pt asking about form for femring, pt informed form completed and faxed, pt to call insurance in regards to prevacid and boniva and get back in touch with our office.

## 2013-06-14 ENCOUNTER — Other Ambulatory Visit: Payer: Self-pay | Admitting: Obstetrics & Gynecology

## 2013-06-14 ENCOUNTER — Telehealth: Payer: Self-pay | Admitting: *Deleted

## 2013-06-14 MED ORDER — METRONIDAZOLE 500 MG PO TABS: 500.0000 mg | ORAL_TABLET | Freq: Two times a day (BID) | ORAL | Status: DC

## 2013-06-14 NOTE — Telephone Encounter (Signed)
Pt states continues to having burning with urination and continues to have same discharge. Pt states does Dr. Elonda Husky want to prescribe another med or does she need to seen. Offered pt an appt, pt states did not really want to come back in for this. Please advise.

## 2013-06-15 NOTE — Telephone Encounter (Signed)
Pt informed flagyl e-scribed to [facility], if continues to have discharge after taking flagyl will need to be seen. Pt verbalized understanding.

## 2013-06-18 ENCOUNTER — Ambulatory Visit (INDEPENDENT_AMBULATORY_CARE_PROVIDER_SITE_OTHER): Payer: Medicare Other | Admitting: Obstetrics & Gynecology

## 2013-06-18 ENCOUNTER — Encounter: Payer: Self-pay | Admitting: Obstetrics & Gynecology

## 2013-06-18 VITALS — BP 110/60 | Wt 117.0 lb

## 2013-06-18 DIAGNOSIS — B373 Candidiasis of vulva and vagina: Secondary | ICD-10-CM

## 2013-06-18 DIAGNOSIS — B3731 Acute candidiasis of vulva and vagina: Secondary | ICD-10-CM

## 2013-06-18 MED ORDER — TERCONAZOLE 0.4 % VA CREA: 1.0000 | TOPICAL_CREAM | Freq: Every day | VAGINAL | Status: DC

## 2013-06-18 NOTE — Progress Notes (Signed)
Patient ID: Yvette Branch, female   DOB: Mar 12, 1946, 68 y.o.   MRN: [id number] Pt treated for BV 12 days ago. She called a couple of days after finishing the metrogel and said she was still itching and burning i called her in a rx for oral metornidazole and she had a reaction and stopped aftr 2 tablets  Exam +yeast today terazol 7 to use for 7 days  Follow up prn Minimal itching  On chest can use hydrocortisone cream OTC  Past Medical History  Diagnosis Date  . Arthritis   . Osteoporosis   . Bowel obstruction   . Abdominal abscess   . Anxiety   . GERD (gastroesophageal reflux disease)   . Hot flashes, menopausal   . Malnutrition   . Intra-abdominal abscess post-procedure   . Vaginal prolapse   . Rectal prolapse   . Degenerative arthritis of lumbar spine   . Complication of anesthesia     trouble waking up with colonoscopy2007    Past Surgical History  Procedure Laterality Date  . Abdominal hysterectomy    . Abdominal surgery    . Bowel obstruction    . Tonsillectomy      OB History   Grav Para Term Preterm Abortions TAB SAB Ect Mult Living                  No Known Allergies  History   Social History  . Marital Status: Single    Spouse Name: N/A    Number of Children: 0  . Years of Education: 12   Occupational History  . 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 Smoking status: Never Smoker   . Smokeless tobacco: Never Used  . Alcohol Use: Yes     Comment: Occasional wine.  . Drug Use: No  . Sexual Activity: Yes    Birth Control/ Protection: Surgical   Other Topics Concern  . None   Social History Narrative   Single.  Unmarried, no children.  Lives alone. Independent of ADLs, ambulation.    Family History  Problem Relation Age of Onset  . Cancer Mother     Throat  . Cancer Sister     Leukemia  . Cancer Brother     Bladder

## 2013-06-18 NOTE — Addendum Note (Signed)
Addended by: Florian Buff on: 06/18/2013 02:57 PM   Modules accepted: Orders

## 2013-06-19 ENCOUNTER — Other Ambulatory Visit: Payer: Self-pay | Admitting: Obstetrics & Gynecology

## 2013-06-19 ENCOUNTER — Telehealth: Payer: Self-pay | Admitting: Obstetrics & Gynecology

## 2013-06-19 NOTE — Telephone Encounter (Signed)
Pt requested allergy to Metronidazole to be added to chart. Allergies updated.

## 2013-06-21 ENCOUNTER — Telehealth: Payer: Self-pay | Admitting: Obstetrics & Gynecology

## 2013-06-21 ENCOUNTER — Telehealth: Payer: Self-pay | Admitting: *Deleted

## 2013-06-21 MED ORDER — IBANDRONATE SODIUM 150 MG PO TABS: 150.0000 mg | ORAL_TABLET | ORAL | Status: DC

## 2013-06-21 NOTE — Telephone Encounter (Signed)
Spoke with pt. She is requesting a refill on Boniva. Takes it monthly. Needs 1 year worth of refills. Pt states she needs to take this med today. Uses [facility]. Thanks!!!

## 2013-06-21 NOTE — Telephone Encounter (Signed)
Spoke with pt letting her know Boniva Rx had been sent to [facility]. [place]

## 2013-07-03 ENCOUNTER — Ambulatory Visit (INDEPENDENT_AMBULATORY_CARE_PROVIDER_SITE_OTHER): Payer: Medicare Other | Admitting: Obstetrics & Gynecology

## 2013-07-03 ENCOUNTER — Encounter: Payer: Self-pay | Admitting: Obstetrics & Gynecology

## 2013-07-03 VITALS — BP 110/60 | Wt 117.0 lb

## 2013-07-03 DIAGNOSIS — L940 Localized scleroderma [morphea]: Secondary | ICD-10-CM

## 2013-07-03 DIAGNOSIS — L900 Lichen sclerosus et atrophicus: Secondary | ICD-10-CM

## 2013-07-03 MED ORDER — CLOBETASOL PROP EMOLLIENT BASE 0.05 % EX CREA: 1.0000 "application " | TOPICAL_CREAM | Freq: Two times a day (BID) | CUTANEOUS | Status: DC

## 2013-07-03 NOTE — Progress Notes (Signed)
Patient ID: Yvette Branch, female   DOB: 1946/02/04, 68 y.o.   MRN: [id number] Chief Complaint  Patient presents with  . Follow-up    Still itching and burning in vaginal area/rash still breast area.    Pt had +BV Treated with metronidazole  Then had +yeast after treatment Treated for yeast as well  Exam Red irritated no lesions  Lichen sclerosis atrophicus  Clobetasol 0.05% BID

## 2013-08-09 ENCOUNTER — Ambulatory Visit: Payer: Medicare Other | Admitting: Obstetrics & Gynecology

## 2013-09-18 ENCOUNTER — Encounter: Payer: Self-pay | Admitting: Obstetrics & Gynecology

## 2013-09-18 ENCOUNTER — Ambulatory Visit (INDEPENDENT_AMBULATORY_CARE_PROVIDER_SITE_OTHER): Payer: Medicare Other | Admitting: Obstetrics & Gynecology

## 2013-09-18 VITALS — BP 100/70 | Wt 114.4 lb

## 2013-09-18 DIAGNOSIS — B3731 Acute candidiasis of vulva and vagina: Secondary | ICD-10-CM | POA: Insufficient documentation

## 2013-09-18 DIAGNOSIS — B373 Candidiasis of vulva and vagina: Secondary | ICD-10-CM

## 2013-09-18 MED ORDER — FLUCONAZOLE 100 MG PO TABS: 100.0000 mg | ORAL_TABLET | Freq: Every day | ORAL | Status: DC

## 2013-09-18 NOTE — Patient Instructions (Signed)
UP4 ADULT Superior, compatible and safe strains DDS  -1 L.acidophilus (super strain) with B. Longum, B.Bifidum & B.Infantis Acid-resistant-survives stomach acid and Bile salts   Fortified with prebiotic Fructooligosaccharide to enhance growth and performance Potency: 15 Billion CFU/capsule Dosage: 1 capsule daily, best before a meal.  Amazon:  $21.90 for 60 caps   iF THERE IS STILL A PROBLEM ADD,   FLORAJEN ACIDOPHILUS High Potency Acidophilus Florajen high potency acidophilus is especially effective for restoring and maintaining a healthy, comfortable balance of vaginal flora. It is also beneficial for overall intestinal health and maintaining the immune system. Stratton cultures per capsule Available in 30 and 60 capsules   Amazon:  $19.99 for 60 caps

## 2013-09-18 NOTE — Progress Notes (Signed)
   Subjective:    Patient ID: Yvette Branch, female    DOB: 1945/11/20, 68 y.o.   MRN: [id number]  HPI Several month history of complicated vaginitis, chasing BV followed by yeast and heavy WBC infiltrate    Review of Systems No burning with urination, frequency or urgency No nausea, vomiting or diarrhea Nor fever chills or other constitutional symptoms     Objective:   Physical Exam Vulva is normal without lesions Vagina is pink moist with scant yellowish grey discharge Cervix absent Uterus is absent Wet prep:  Disrupted vaginal microenvironment       Assessment & Plan:  Have recommended Oral probiotics GI and also floragen No other changes for now Follow up next month Consider D/C femring if continues

## 2013-10-01 ENCOUNTER — Telehealth: Payer: Self-pay | Admitting: *Deleted

## 2013-10-01 NOTE — Telephone Encounter (Signed)
Pt states Dr. Elonda Husky gave her diflucan. Pt states diflucan upsetting her stomach, light vaginal bleeding. Pt states does she need to leave [place]ring in or take it out and replace. Pt states does have an appt with Dr. Elonda Husky on 10/18/2013.

## 2013-10-08 NOTE — Telephone Encounter (Signed)
Leave the femring in, take the diflucan with food

## 2013-10-08 NOTE — Telephone Encounter (Signed)
Pt informed of Dr. Elonda Husky recommendation and verbalized understanding.

## 2013-10-11 ENCOUNTER — Other Ambulatory Visit: Payer: Self-pay

## 2013-10-11 DIAGNOSIS — Z1231 Encounter for screening mammogram for malignant neoplasm of breast: Secondary | ICD-10-CM

## 2013-10-18 ENCOUNTER — Ambulatory Visit (INDEPENDENT_AMBULATORY_CARE_PROVIDER_SITE_OTHER): Payer: Medicare Other | Admitting: Obstetrics & Gynecology

## 2013-10-18 ENCOUNTER — Encounter: Payer: Self-pay | Admitting: Obstetrics & Gynecology

## 2013-10-18 VITALS — BP 108/70 | Wt 115.0 lb

## 2013-10-18 DIAGNOSIS — N760 Acute vaginitis: Secondary | ICD-10-CM

## 2013-10-18 NOTE — Progress Notes (Signed)
Patient ID: Yvette Branch, female   DOB: 1946/01/23, 68 y.o.   MRN: [id number] Blood pressure 108/70, weight 115 lb (52.164 kg).  Chief Complaint  Patient presents with  . Follow-up    HPI SISSY GOETZKE has had a devil of a time getting her vaginal microenvironment back in balance after cipro course back in February We have done multiple wet preps etc and use metronidazole, diflucan terazol gentian violet as needed  ROS No burning with urination, frequency or urgency No nausea, vomiting or diarrhea Nor fever chills or other constitutional symptoms   Blood pressure 108/70, weight 115 lb (52.164 kg).  EXAM Abdomen:       Vulva:            normal appearing vulva with no masses, tenderness or lesions Vagina:          normal mucosa, thin grey discharge Cervix:           removed surgically Uterus:          surgically absent, vaginal cuff well healed Adnexa:         normal adnexa in size, nontender and no masses, no masses  Wet prep: completely normal epithelial cells, no yeast no clue cells no trich no wbc Assessment/Plan:  Healthy vaginal microenvironment   Remove femring and use divigel 1mg  Follow up 1 month

## 2013-10-23 ENCOUNTER — Telehealth: Payer: Self-pay | Admitting: Obstetrics & Gynecology

## 2013-10-23 NOTE — Telephone Encounter (Signed)
Pt states taking divegel 1 mg x 4 times, having headaches and nausea would like to have a lower dosage, if possible samples.

## 2013-10-24 NOTE — Telephone Encounter (Signed)
Tell her to use half a packet just a little dab and save the remainder for the next day, we don't have lower dose samples

## 2013-10-24 NOTE — Telephone Encounter (Signed)
Pt informed per Dr. Elonda Husky to use 1/2 packet of Divegel or a little dab to prevent headaches. Pt states has already reinserted the Femring. Per Mylo Red, CNM pt to remove Femring and use Divegel per Dr. Elonda Husky recommendation. Pt verbalized understanding.

## 2013-11-12 ENCOUNTER — Ambulatory Visit
Admission: RE | Admit: 2013-11-12 | Discharge: 2013-11-12 | Disposition: A | Discharge status: Home / Self Care | Payer: Medicare Other | Source: Ambulatory Visit

## 2013-11-12 DIAGNOSIS — Z1231 Encounter for screening mammogram for malignant neoplasm of breast: Secondary | ICD-10-CM

## 2013-11-15 ENCOUNTER — Ambulatory Visit (INDEPENDENT_AMBULATORY_CARE_PROVIDER_SITE_OTHER): Payer: Medicare Other | Admitting: Obstetrics & Gynecology

## 2013-11-15 ENCOUNTER — Encounter: Payer: Self-pay | Admitting: Obstetrics & Gynecology

## 2013-11-15 VITALS — BP 120/80 | Wt 116.4 lb

## 2013-11-15 DIAGNOSIS — N760 Acute vaginitis: Secondary | ICD-10-CM

## 2013-11-15 DIAGNOSIS — Z87440 Personal history of urinary (tract) infections: Secondary | ICD-10-CM

## 2013-11-15 LAB — POCT URINALYSIS DIPSTICK
Glucose, UA: NEGATIVE
Ketones, UA: NEGATIVE
Leukocytes, UA: NEGATIVE
Nitrite, UA: NEGATIVE
PROTEIN UA: NEGATIVE
RBC UA: NEGATIVE

## 2013-11-15 MED ORDER — HYDROCORTISONE 2.5 % RE CREA: 1.0000 "application " | TOPICAL_CREAM | Freq: Two times a day (BID) | RECTAL | Status: DC

## 2013-11-15 MED ORDER — ESTRADIOL 1 MG/GM TD GEL: 1.0000 | Freq: Every day | TRANSDERMAL | Status: DC

## 2013-11-15 NOTE — Addendum Note (Signed)
Addended by: Florian Buff on: 11/15/2013 04:05 PM   Modules accepted: Orders

## 2013-11-15 NOTE — Addendum Note (Signed)
Addended by: Florian Buff on: 11/15/2013 04:09 PM   Modules accepted: Orders

## 2013-11-15 NOTE — Progress Notes (Signed)
Patient ID: Yvette Branch, female   DOB: [birth year], 68 y.o.   MRN: [id number] Patient ID: Yvette Branch, female   DOB: 1945-05-13, 68 y.o.   MRN: [id number] Blood pressure 120/80, weight 116 lb 6.4 oz (52.799 kg).  Chief Complaint  Patient presents with  . Follow-up    HPI Yvette Branch has had a devil of a time getting her vaginal microenvironment back in balance after cipro course back in February We have done multiple wet preps etc and use metronidazole, diflucan terazol gentian violet as needed  ROS No burning with urination, frequency or urgency No nausea, vomiting or diarrhea Nor fever chills or other constitutional symptoms   Blood pressure 120/80, weight 116 lb 6.4 oz (52.799 kg).  EXAM Abdomen:       Vulva:            normal appearing vulva with no masses, tenderness or lesions Vagina:          normal mucosa, thin grey discharge Cervix:           removed surgically Uterus:          surgically absent, vaginal cuff well healed Adnexa:         normal adnexa in size, nontender and no masses, no masses  Wet prep: completely normal epithelial cells, no yeast no clue cells no trich no wbc Assessment/Plan:  Healthy vaginal microenvironment   Remove femring and use divigel 1mg  Follow up prn

## 2014-03-12 ENCOUNTER — Encounter (INDEPENDENT_AMBULATORY_CARE_PROVIDER_SITE_OTHER): Payer: Self-pay | Admitting: *Deleted

## 2014-04-22 ENCOUNTER — Ambulatory Visit (INDEPENDENT_AMBULATORY_CARE_PROVIDER_SITE_OTHER): Payer: Medicare Other | Admitting: Internal Medicine

## 2014-05-14 ENCOUNTER — Ambulatory Visit (INDEPENDENT_AMBULATORY_CARE_PROVIDER_SITE_OTHER): Payer: Medicare Other | Admitting: Internal Medicine

## 2014-06-27 ENCOUNTER — Ambulatory Visit (INDEPENDENT_AMBULATORY_CARE_PROVIDER_SITE_OTHER): Payer: Medicare Other | Admitting: Internal Medicine

## 2014-06-27 ENCOUNTER — Encounter (INDEPENDENT_AMBULATORY_CARE_PROVIDER_SITE_OTHER): Payer: Self-pay | Admitting: Internal Medicine

## 2014-06-27 VITALS — BP 102/60 | HR 64 | Temp 98.0°F | Ht 62.0 in | Wt 118.4 lb

## 2014-06-27 DIAGNOSIS — Z8719 Personal history of other diseases of the digestive system: Secondary | ICD-10-CM | POA: Diagnosis not present

## 2014-06-27 DIAGNOSIS — R1084 Generalized abdominal pain: Secondary | ICD-10-CM

## 2014-06-27 DIAGNOSIS — K648 Other hemorrhoids: Secondary | ICD-10-CM | POA: Diagnosis not present

## 2014-06-27 LAB — HEPATIC FUNCTION PANEL
ALBUMIN: 4.2 g/dL (ref 3.5–5.2)
ALK PHOS: 57 U/L (ref 39–117)
ALT: 12 U/L (ref 0–35)
AST: 21 U/L (ref 0–37)
BILIRUBIN INDIRECT: 0.2 mg/dL (ref 0.2–1.2)
Bilirubin, Direct: 0.1 mg/dL (ref 0.0–0.3)
Total Bilirubin: 0.3 mg/dL (ref 0.2–1.2)
Total Protein: 7.3 g/dL (ref 6.0–8.3)

## 2014-06-27 MED ORDER — HYDROCORTISONE ACE-PRAMOXINE 1-1 % RE FOAM: 1.0000 | Freq: Two times a day (BID) | RECTAL | Status: DC

## 2014-06-27 NOTE — Progress Notes (Addendum)
Subjective:    Patient ID: Yvette Branch, female    DOB: 12-10-1945, 69 y.o.   MRN: [id number]  HPI Referred to our office by Dr. Hilma Favors for lower abdominal pain.  She tells me she has rectal pain. She has fullness in her rectum. She has had the fullness for about 6 months. She has seen some rectal bleeding last week after several BMs. Stools were not loose..  She says she has some hemorrhoids. Sometimes she has abdominal pain which varies. She has a BM daily. She does not have the pain everyday.  Her appetite is good. No weight loss.  There is no epigastric pain. No nausea or vomiting. Usually has a BM daily.   01/13/2006 Colonoscopy: constipation  Normal rectum. Mild diverticulosis in the sigmoid colon. No inflammation. No colonic or rectal polyps, Tortuous and redundant colon. Normal colon other wise .  Hx of intra-abdominal abscess, status post exploratory lap and lysis of adhesions.  by Dr. Anthony Sar at [facility] for a small bowel obstruction 2013 She underwent a CT which revealed a large pelvis abscess.  She was followed  By Dr Arnoldo Morale.  She did have another SBO in 2014 and improved with conservative treatment at [facility] with Dr. Elmo Putt C. Thomas.  02/09/2013 CT abdomen/pelvis with CM:  Abdominal pain ,vomiting:  IMPRESSION: Dilated stomach and small bowel consistent with small bowel obstruction which may be caused adhesions.   No family hx of colon cancer.   01/28/2014 H and H 12.4 and 37.2, MCV 87.7, total bili 0.3, ALP 64, ALT 11, AST 19, Albumin 4.4    Review of Systems Past Medical History  Diagnosis Date  . Arthritis   . Osteoporosis   . Bowel obstruction   . Abdominal abscess   . Anxiety   . GERD (gastroesophageal reflux disease)   . Hot flashes, menopausal   . Malnutrition   . Intra-abdominal abscess post-procedure   . Vaginal prolapse   . Rectal prolapse   . Degenerative arthritis of lumbar spine   . Complication of anesthesia     trouble waking up with  colonoscopy2007    Past Surgical History  Procedure Laterality Date  . Abdominal hysterectomy    . Abdominal surgery    . Bowel obstruction    . Tonsillectomy      Allergies  Allergen Reactions  . Metronidazole Hives, Diarrhea and Rash    Numbness in face    Current Outpatient Prescriptions on File Prior to Visit  Medication Sig Dispense Refill  . ALPRAZolam (XANAX) 0.25 MG tablet Take 0.25 mg by mouth 4 (four) times daily as needed. For anxiety    . aspirin EC 81 MG tablet Take 81 mg by mouth daily.    . Biotin 5000 MCG TABS Take 1 tablet by mouth daily.    . Coenzyme Q10 (CO Q 10 PO) Take by mouth.    . lansoprazole (PREVACID) 30 MG capsule TAKE 1 CAPSULE BY MOUTH TWICE DAILY. 180 capsule 3  . Omega-3 Fatty Acids (FISH OIL) 1200 MG CAPS Take 1 capsule by mouth daily.    . polyethylene glycol (MIRALAX / GLYCOLAX) packet Take 17 g by mouth daily as needed. For constipation    . Probiotic Product (ALIGN) 4 MG CAPS Take 1 capsule by mouth daily. 30 capsule 0  . pyridOXINE (VITAMIN B-6) 100 MG tablet Take 100 mg by mouth daily.    Marland Kitchen UNABLE TO FIND Take 1 tablet by mouth 4 (four) times daily. Med Name:  OSTEO-SHEATH (ENHANCED BONE SUPPORT-CALCIUM SUPPLEMENT) Includes: Calcium 1221mg  and Vitamin D3 1200IU    . vitamin A 8000 UNIT capsule Take 8,000 Units by mouth daily.    . vitamin C (ASCORBIC ACID) 500 MG tablet Take 500 mg by mouth daily.    . vitamin E 400 UNIT capsule Take 400 Units by mouth daily.    . vitamin k 100 MCG tablet Take 100 mcg by mouth daily.     No current facility-administered medications on file prior to visit.        Objective:   Physical Exam Blood pressure 102/60, pulse 64, temperature 98 F (36.7 C), height 5\' 2"  (1.575 m), weight 118 lb 6.4 oz (53.706 kg). Alert and oriented. Skin warm and dry. Oral mucosa is moist.   . Sclera anicteric, conjunctivae is pink. Thyroid not enlarged. No cervical lymphadenopathy. Lungs clear. Heart regular rate and rhythm.   Abdomen is soft. Bowel sounds are positive. No hepatomegaly. No abdominal masses felt. No tenderness.  No edema to lower extremities.  Scar noted to abdomen.  Small hemorrhoid to rectum (reddened)    Developer: [id number], Exp 9-17 Card: Lot 02-14, Exp 07.18     Assessment & Plan:  Rectal pain./fullness. Vague abdominal pain since her surgery. ? Related to adhesions. She is however having normal BMs.   Hemorrhoids noted.    Plan: Colonoscopy. Her last colonoscopy was in 2007. Proctofoam BID x 5 days. CBC and Hepatic function today

## 2014-06-27 NOTE — Patient Instructions (Signed)
Colonoscopy.  The risks and benefits such as perforation, bleeding, and infection were reviewed with the patient and is agreeable. 

## 2014-06-27 NOTE — Progress Notes (Signed)
Subjective:    Patient ID: Yvette Branch, female    DOB: 31-Mar-1946, 69 y.o.   MRN: [id number]  HPI Referred to our office by Dr. Hilma Favors for lower abdominal pain.  She tells me she has rectal pain. She has fullness in her rectum. She has had the fullness for about 6 months. She has seen some rectal bleeding last week after several BMs. Stools were not loose..  She says she has some hemorrhoids. Sometimes she has abdominal pain which varies. She has a BM daily. She does not have the pain everyday.  Her appetite is good. No weight loss.  There is no epigastric pain. No nausea or vomiting. Usually has a BM daily.   01/13/2006 Colonoscopy: constipation Dr. Anthony Sar Normal rectum. Mild diverticulosis in the sigmoid colon. No inflammation. No colonic or rectal polyps, Tortuous and redundant colon. Normal colon other wise .  Hx of intra-abdominal abscess, status post exploratory lap and lysis of adhesions.  by Dr. Anthony Sar at [facility] for a small bowel obstruction 2013 She underwent a CT which revealed a large pelvis abscess.  She was followed  By Dr Arnoldo Morale.  She did have another SBO in 2014 and improved with conservative treatment at [facility] with Dr. Elmo Putt C. Thomas.  02/09/2013 CT abdomen/pelvis with CM:  Abdominal pain ,vomiting:  IMPRESSION: Dilated stomach and small bowel consistent with small bowel obstruction which may be caused adhesions.   No family hx of colon cancer.   01/28/2014 H and H 12.4 and 37.2, MCV 87.7, total bili 0.3, ALP 64, ALT 11, AST 19, Albumin 4.4    Review of Systems Past Medical History  Diagnosis Date  . Arthritis   . Osteoporosis   . Bowel obstruction   . Abdominal abscess   . Anxiety   . GERD (gastroesophageal reflux disease)   . Hot flashes, menopausal   . Malnutrition   . Intra-abdominal abscess post-procedure   . Vaginal prolapse   . Rectal prolapse   . Degenerative arthritis of lumbar spine   . Complication of anesthesia     trouble waking up  with colonoscopy2007    Past Surgical History  Procedure Laterality Date  . Abdominal hysterectomy    . Abdominal surgery    . Bowel obstruction    . Tonsillectomy      Allergies  Allergen Reactions  . Metronidazole Hives, Diarrhea and Rash    Numbness in face    Current Outpatient Prescriptions on File Prior to Visit  Medication Sig Dispense Refill  . ALPRAZolam (XANAX) 0.25 MG tablet Take 0.25 mg by mouth 4 (four) times daily as needed. For anxiety    . aspirin EC 81 MG tablet Take 81 mg by mouth daily.    . Biotin 5000 MCG TABS Take 1 tablet by mouth daily.    . Coenzyme Q10 (CO Q 10 PO) Take by mouth.    . lansoprazole (PREVACID) 30 MG capsule TAKE 1 CAPSULE BY MOUTH TWICE DAILY. 180 capsule 3  . Omega-3 Fatty Acids (FISH OIL) 1200 MG CAPS Take 1 capsule by mouth daily.    . polyethylene glycol (MIRALAX / GLYCOLAX) packet Take 17 g by mouth daily as needed. For constipation    . Probiotic Product (ALIGN) 4 MG CAPS Take 1 capsule by mouth daily. 30 capsule 0  . pyridOXINE (VITAMIN B-6) 100 MG tablet Take 100 mg by mouth daily.    Marland Kitchen UNABLE TO FIND Take 1 tablet by mouth 4 (four) times daily. Med  Name: OSTEO-SHEATH (ENHANCED BONE SUPPORT-CALCIUM SUPPLEMENT) Includes: Calcium 1221mg  and Vitamin D3 1200IU    . vitamin A 8000 UNIT capsule Take 8,000 Units by mouth daily.    . vitamin C (ASCORBIC ACID) 500 MG tablet Take 500 mg by mouth daily.    . vitamin E 400 UNIT capsule Take 400 Units by mouth daily.    . vitamin k 100 MCG tablet Take 100 mcg by mouth daily.     No current facility-administered medications on file prior to visit.        Objective:   Physical Exam Blood pressure 102/60, pulse 64, temperature 98 F (36.7 C), height 5\' 2"  (1.575 m), weight 118 lb 6.4 oz (53.706 kg). Alert and oriented. Skin warm and dry. Oral mucosa is moist.   . Sclera anicteric, conjunctivae is pink. Thyroid not enlarged. No cervical lymphadenopathy. Lungs clear. Heart regular rate and  rhythm.  Abdomen is soft. Bowel sounds are positive. No hepatomegaly. No abdominal masses felt. No tenderness.  No edema to lower extremities.  Scar noted to abdomen.  Small hemorrhoid to rectum (reddned)    Developer: [id number], Exp 9-17 Card: Lot 02-14, Exp 07.18     Assessment & Plan:  Rectal pain./fullness. Vague abdominal pain since her surgery. ? Related to adhesions. She is however having normal BMs.   Hemorrhoids noted.    Plan: Colonoscopy. Her last colonoscopy was in 2007 by Dr. Anthony Sar.  Proctofoam BID x 5 days. CBC and Hepatic function today

## 2014-06-28 LAB — CBC WITH DIFFERENTIAL/PLATELET
Basophils Absolute: 0.1 10*3/uL (ref 0.0–0.1)
Basophils Relative: 1 % (ref 0–1)
EOS ABS: 0.1 10*3/uL (ref 0.0–0.7)
EOS PCT: 1 % (ref 0–5)
HCT: 36.9 % (ref 36.0–46.0)
Hemoglobin: 11.9 g/dL — ABNORMAL LOW (ref 12.0–15.0)
LYMPHS ABS: 2.1 10*3/uL (ref 0.7–4.0)
Lymphocytes Relative: 33 % (ref 12–46)
MCH: 28.3 pg (ref 26.0–34.0)
MCHC: 32.2 g/dL (ref 30.0–36.0)
MCV: 87.6 fL (ref 78.0–100.0)
MONO ABS: 0.5 10*3/uL (ref 0.1–1.0)
MONOS PCT: 7 % (ref 3–12)
MPV: 10.3 fL (ref 8.6–12.4)
NEUTROS PCT: 58 % (ref 43–77)
Neutro Abs: 3.8 10*3/uL (ref 1.7–7.7)
Platelets: 311 10*3/uL (ref 150–400)
RBC: 4.21 MIL/uL (ref 3.87–5.11)
RDW: 15.2 % (ref 11.5–15.5)
WBC: 6.5 10*3/uL (ref 4.0–10.5)

## 2014-07-02 ENCOUNTER — Encounter (INDEPENDENT_AMBULATORY_CARE_PROVIDER_SITE_OTHER): Payer: Self-pay

## 2014-07-09 ENCOUNTER — Telehealth (INDEPENDENT_AMBULATORY_CARE_PROVIDER_SITE_OTHER): Payer: Self-pay | Admitting: *Deleted

## 2014-07-09 NOTE — Telephone Encounter (Signed)
No answer at home. Will call tomorrow.  

## 2014-07-09 NOTE — Telephone Encounter (Signed)
Yvette Branch would like to know what her next step is. Please return her call at [phone].

## 2014-07-10 ENCOUNTER — Telehealth (INDEPENDENT_AMBULATORY_CARE_PROVIDER_SITE_OTHER): Payer: Self-pay | Admitting: *Deleted

## 2014-07-10 ENCOUNTER — Other Ambulatory Visit (INDEPENDENT_AMBULATORY_CARE_PROVIDER_SITE_OTHER): Payer: Self-pay | Admitting: *Deleted

## 2014-07-10 DIAGNOSIS — K6289 Other specified diseases of anus and rectum: Secondary | ICD-10-CM

## 2014-07-10 DIAGNOSIS — Z1211 Encounter for screening for malignant neoplasm of colon: Secondary | ICD-10-CM

## 2014-07-10 MED ORDER — PEG 3350-KCL-NA BICARB-NACL 420 G PO SOLR: 4000.0000 mL | Freq: Once | ORAL | Status: DC

## 2014-07-10 NOTE — Telephone Encounter (Signed)
Patient needs trilyte 

## 2014-07-10 NOTE — Telephone Encounter (Signed)
Ann, please schedule colonoscopy. I have talked with patient.

## 2014-07-10 NOTE — Telephone Encounter (Signed)
TCS sch'd 08/02/14, patient aware

## 2014-07-11 ENCOUNTER — Telehealth (INDEPENDENT_AMBULATORY_CARE_PROVIDER_SITE_OTHER): Payer: Self-pay | Admitting: *Deleted

## 2014-07-11 NOTE — Telephone Encounter (Signed)
Please call patient -- [phone], she is concerned about having colonoscopy, has had blockage in the past

## 2014-07-12 NOTE — Telephone Encounter (Signed)
I have talked with patient  

## 2014-08-02 ENCOUNTER — Ambulatory Visit (HOSPITAL_COMMUNITY)
Admission: RE | Admit: 2014-08-02 | Discharge: 2014-08-02 | Disposition: A | Discharge status: Home / Self Care | Payer: Medicare Other | Source: Ambulatory Visit | Attending: Internal Medicine | Admitting: Internal Medicine

## 2014-08-02 ENCOUNTER — Encounter (HOSPITAL_COMMUNITY): Payer: Self-pay | Admitting: *Deleted

## 2014-08-02 ENCOUNTER — Encounter (HOSPITAL_COMMUNITY)
Admission: RE | Disposition: A | Discharge status: Home / Self Care | Payer: Self-pay | Source: Ambulatory Visit | Attending: Internal Medicine

## 2014-08-02 DIAGNOSIS — Z90710 Acquired absence of both cervix and uterus: Secondary | ICD-10-CM | POA: Insufficient documentation

## 2014-08-02 DIAGNOSIS — K5730 Diverticulosis of large intestine without perforation or abscess without bleeding: Secondary | ICD-10-CM

## 2014-08-02 DIAGNOSIS — Z5309 Procedure and treatment not carried out because of other contraindication: Secondary | ICD-10-CM | POA: Diagnosis not present

## 2014-08-02 DIAGNOSIS — M47896 Other spondylosis, lumbar region: Secondary | ICD-10-CM | POA: Diagnosis not present

## 2014-08-02 DIAGNOSIS — F1099 Alcohol use, unspecified with unspecified alcohol-induced disorder: Secondary | ICD-10-CM | POA: Insufficient documentation

## 2014-08-02 DIAGNOSIS — K219 Gastro-esophageal reflux disease without esophagitis: Secondary | ICD-10-CM | POA: Insufficient documentation

## 2014-08-02 DIAGNOSIS — Z881 Allergy status to other antibiotic agents status: Secondary | ICD-10-CM | POA: Diagnosis not present

## 2014-08-02 DIAGNOSIS — K6289 Other specified diseases of anus and rectum: Secondary | ICD-10-CM

## 2014-08-02 DIAGNOSIS — M810 Age-related osteoporosis without current pathological fracture: Secondary | ICD-10-CM | POA: Insufficient documentation

## 2014-08-02 DIAGNOSIS — F419 Anxiety disorder, unspecified: Secondary | ICD-10-CM | POA: Insufficient documentation

## 2014-08-02 DIAGNOSIS — Z1211 Encounter for screening for malignant neoplasm of colon: Secondary | ICD-10-CM | POA: Diagnosis present

## 2014-08-02 DIAGNOSIS — K5669 Other intestinal obstruction: Secondary | ICD-10-CM | POA: Diagnosis not present

## 2014-08-02 HISTORY — PX: COLONOSCOPY: SHX5424

## 2014-08-02 SURGERY — COLONOSCOPY
Anesthesia: Moderate Sedation

## 2014-08-02 MED ORDER — AMOXICILLIN-POT CLAVULANATE 875-125 MG PO TABS: 1.0000 | ORAL_TABLET | Freq: Two times a day (BID) | ORAL | Status: DC

## 2014-08-02 MED ORDER — MIDAZOLAM HCL 5 MG/5ML IJ SOLN
INTRAMUSCULAR | Status: AC
  Filled 2014-08-02: qty 5

## 2014-08-02 MED ORDER — MIDAZOLAM HCL 5 MG/5ML IJ SOLN
INTRAMUSCULAR | Status: DC | PRN
  Administered 2014-08-02 (×2): 2 mg via INTRAVENOUS
  Administered 2014-08-02: 3 mg via INTRAVENOUS
  Administered 2014-08-02: 2 mg via INTRAVENOUS
  Administered 2014-08-02: 3 mg via INTRAVENOUS

## 2014-08-02 MED ORDER — DOCUSATE SODIUM 100 MG PO CAPS: 100.0000 mg | ORAL_CAPSULE | Freq: Two times a day (BID) | ORAL | Status: AC

## 2014-08-02 MED ORDER — MIDAZOLAM HCL 5 MG/5ML IJ SOLN
INTRAMUSCULAR | Status: AC
  Filled 2014-08-02: qty 10

## 2014-08-02 MED ORDER — MEPERIDINE HCL 50 MG/ML IJ SOLN
INTRAMUSCULAR | Status: AC
  Filled 2014-08-02: qty 1

## 2014-08-02 MED ORDER — STERILE WATER FOR IRRIGATION IR SOLN
Status: DC | PRN
  Administered 2014-08-02: 13:00:00

## 2014-08-02 MED ORDER — MEPERIDINE HCL 50 MG/ML IJ SOLN
INTRAMUSCULAR | Status: DC | PRN
  Administered 2014-08-02: 25 mg
  Administered 2014-08-02: 25 mg via INTRAVENOUS

## 2014-08-02 MED ORDER — SODIUM CHLORIDE 0.9 % IV SOLN
INTRAVENOUS | Status: DC
  Administered 2014-08-02: 12:00:00 via INTRAVENOUS

## 2014-08-02 NOTE — Discharge Instructions (Signed)
Resume usual medications and diet. Augmentin 875/125 one tablet twice daily for 10 days. No driving for 24 hours. Office visit in 2 weeks. Office will call.  Diverticulitis Diverticulitis is inflammation or infection of small pouches in your colon that form when you have a condition called diverticulosis. The pouches in your colon are called diverticula. Your colon, or large intestine, is where water is absorbed and stool is formed. Complications of diverticulitis can include:  Bleeding.  Severe infection.  Severe pain.  Perforation of your colon.  Obstruction of your colon. CAUSES  Diverticulitis is caused by bacteria. Diverticulitis happens when stool becomes trapped in diverticula. This allows bacteria to grow in the diverticula, which can lead to inflammation and infection. RISK FACTORS People with diverticulosis are at risk for diverticulitis. Eating a diet that does not include enough fiber from fruits and vegetables may make diverticulitis more likely to develop. SYMPTOMS  Symptoms of diverticulitis may include:  Abdominal pain and tenderness. The pain is normally located on the left side of the abdomen, but may occur in other areas.  Fever and chills.  Bloating.  Cramping.  Nausea.  Vomiting.  Constipation.  Diarrhea.  Blood in your stool. DIAGNOSIS  Your health care provider will ask you about your medical history and do a physical exam. You may need to have tests done because many medical conditions can cause the same symptoms as diverticulitis. Tests may include:  Blood tests.  Urine tests.  Imaging tests of the abdomen, including X-rays and CT scans. When your condition is under control, your health care provider may recommend that you have a colonoscopy. A colonoscopy can show how severe your diverticula are and whether something else is causing your symptoms. TREATMENT  Most cases of diverticulitis are mild and can be treated at home. Treatment may  include:  Taking over-the-counter pain medicines.  Following a clear liquid diet.  Taking antibiotic medicines by mouth for 7-10 days. More severe cases may be treated at a hospital. Treatment may include:  Not eating or drinking.  Taking prescription pain medicine.  Receiving antibiotic medicines through an IV tube.  Receiving fluids and nutrition through an IV tube.  Surgery. HOME CARE INSTRUCTIONS   Follow your health care provider's instructions carefully.  Follow a full liquid diet or other diet as directed by your health care provider. After your symptoms improve, your health care provider may tell you to change your diet. He or she may recommend you eat a high-fiber diet. Fruits and vegetables are good sources of fiber. Fiber makes it easier to pass stool.  Take fiber supplements or probiotics as directed by your health care provider.  Only take medicines as directed by your health care provider.  Keep all your follow-up appointments. SEEK MEDICAL CARE IF:   Your pain does not improve.  You have a hard time eating food.  Your bowel movements do not return to normal. SEEK IMMEDIATE MEDICAL CARE IF:   Your pain becomes worse.  Your symptoms do not get better.  Your symptoms suddenly get worse.  You have a fever.  You have repeated vomiting.  You have bloody or black, tarry stools. MAKE SURE YOU:   Understand these instructions.  Will watch your condition.  Will get help right away if you are not doing well or get worse. Document Released: 01/06/2005 Document Revised: 04/03/2013 Document Reviewed: 02/21/2013 [facility] Patient Information 2015 [place], [place]. This information is not intended to replace advice given to you by your health care  provider. Make sure you discuss any questions you have with your health care provider. Colonoscopy, Care After These instructions give you information on caring for yourself after your procedure. Your doctor may also  give you more specific instructions. Call your doctor if you have any problems or questions after your procedure. HOME CARE  Do not drive for 24 hours.  Do not sign important papers or use machinery for 24 hours.  You may shower.  You may go back to your usual activities, but go slower for the first 24 hours.  Take rest breaks often during the first 24 hours.  Walk around or use warm packs on your belly (abdomen) if you have belly cramping or gas.  Drink enough fluids to keep your pee (urine) clear or pale yellow.  Resume your normal diet. Avoid heavy or fried foods.  Avoid drinking alcohol for 24 hours or as told by your doctor.  Only take medicines as told by your doctor. If a tissue sample (biopsy) was taken during the procedure:   Do not take aspirin or blood thinners for 7 days, or as told by your doctor.  Do not drink alcohol for 7 days, or as told by your doctor.  Eat soft foods for the first 24 hours. GET HELP IF: You still have a small amount of blood in your poop (stool) 2-3 days after the procedure. GET HELP RIGHT AWAY IF:  You have more than a small amount of blood in your poop.  You see clumps of tissue (blood clots) in your poop.  Your belly is puffy (swollen).  You feel sick to your stomach (nauseous) or throw up (vomit).  You have a fever.  You have belly pain that gets worse and medicine does not help. MAKE SURE YOU:  Understand these instructions.  Will watch your condition.  Will get help right away if you are not doing well or get worse. Document Released: 05/01/2010 Document Revised: 04/03/2013 Document Reviewed: 12/04/2012 [facility] Patient Information 2015 [place], [place]. This information is not intended to replace advice given to you by your health care provider. Make sure you discuss any questions you have with your health care provider.

## 2014-08-02 NOTE — H&P (Signed)
Yvette Branch is an 69 y.o. female.   Chief Complaint: Patient is here for colonoscopy. HPI: Patient is 69 year old Caucasian female who is here primarily for screening colonoscopy. She has occasional hematochezia felt to be secondary to hemorrhoids. She also has intermittent LLQ abdominal pain which is not associated with nausea vomiting or diarrhea. Last colonoscopy was over 9 years ago. She has good appetite and her weight has been stable. She has history of frequent small bowel obstruction. She had surgery in September 1610 complicated by intra-abdominal abscess. Family history is negative for CRC.  Past Medical History  Diagnosis Date  . Arthritis   . Osteoporosis   . Bowel obstruction     Multiple  . Abdominal abscess   . Anxiety   . GERD (gastroesophageal reflux disease)   . Hot flashes, menopausal   . Malnutrition   . Intra-abdominal abscess post-procedure   . Vaginal prolapse   . Rectal prolapse   . Degenerative arthritis of lumbar spine   . Complication of anesthesia     trouble waking up with colonoscopy2007    Past Surgical History  Procedure Laterality Date  . Abdominal hysterectomy    . Abdominal surgery    . Bowel obstruction    . Tonsillectomy      Family History  Problem Relation Age of Onset  . Cancer Mother     Throat  . Cancer Sister     Leukemia   Social History:  reports that she has never smoked. She has never used smokeless tobacco. She reports that she drinks alcohol. She reports that she does not use illicit drugs.  Allergies:  Allergies  Allergen Reactions  . Metronidazole Hives, Diarrhea and Rash    Numbness in face    Medications Prior to Admission  Medication Sig Dispense Refill  . ALPRAZolam (XANAX) 0.25 MG tablet Take 0.25 mg by mouth 4 (four) times daily as needed. For anxiety    . aspirin EC 81 MG tablet Take 81 mg by mouth daily.    . Biotin 1000 MCG tablet Take 1,000 mcg by mouth daily.     . Biotin 5000 MCG TABS Take 1 tablet  by mouth daily.    . Black Cohosh 40 MG CAPS Take 40 mg by mouth.    . calcium carbonate (TUMS - DOSED IN MG ELEMENTAL CALCIUM) 500 MG chewable tablet Chew 2 tablets by mouth daily.     . Coenzyme Q10 (CO Q 10 PO) Take 1 tablet by mouth daily.     Marland Kitchen docusate sodium (COLACE) 100 MG capsule Take 200 mg by mouth daily as needed for mild constipation or moderate constipation.     Marland Kitchen glucosamine-chondroitin 500-400 MG tablet Take 2 tablets by mouth daily.    . lansoprazole (PREVACID) 30 MG capsule TAKE 1 CAPSULE BY MOUTH TWICE DAILY. 180 capsule 3  . Omega-3 Fatty Acids (FISH OIL) 1200 MG CAPS Take 1 capsule by mouth daily.    . polyethylene glycol (MIRALAX / GLYCOLAX) packet Take 17 g by mouth daily as needed. For constipation    . polyethylene glycol-electrolytes (NULYTELY/GOLYTELY) 420 G solution Take 4,000 mLs by mouth once. 4000 mL 0  . Probiotic Product (ALIGN) 4 MG CAPS Take 1 capsule by mouth daily. 30 capsule 0  . pyridOXINE (VITAMIN B-6) 100 MG tablet Take 100 mg by mouth daily.    Marland Kitchen UNABLE TO FIND Take 1 tablet by mouth 4 (four) times daily. Med Name: OSTEO-SHEATH (ENHANCED BONE SUPPORT-CALCIUM SUPPLEMENT) Includes: Calcium  1221mg  and Vitamin D3 1200IU    . vitamin A 8000 UNIT capsule Take 8,000 Units by mouth daily.    . vitamin C (ASCORBIC ACID) 500 MG tablet Take 500 mg by mouth daily.    . vitamin E 400 UNIT capsule Take 400 Units by mouth daily.    . vitamin k 100 MCG tablet Take 100 mcg by mouth daily.    . hydrocortisone-pramoxine (PROCTOFOAM HC) rectal foam Place 1 applicator rectally 2 (two) times daily. 10 g 0  . Omega-3 Fatty Acids (FISH OIL) 1200 MG CAPS Take by mouth.      No results found for this or any previous visit (from the past 48 hour(s)). No results found.  ROS  Blood pressure 129/62, pulse 76, temperature 97.9 F (36.6 C), temperature source Oral, resp. rate 24, height 5\' 2"  (1.575 m), weight 118 lb (53.524 kg). Physical Exam  Constitutional: She appears  well-developed and well-nourished.  HENT:  Mouth/Throat: Oropharynx is clear and moist.  Eyes: Conjunctivae are normal. No scleral icterus.  Neck: No thyromegaly present.  Accessory rib palpable in left supraclavicular area  Cardiovascular: Normal rate, regular rhythm and normal heart sounds.   No murmur heard. Respiratory: Effort normal and breath sounds normal.  GI: Soft. She exhibits no distension and no mass. There is no tenderness.  Lower midline and right low quadrant scars  Musculoskeletal: She exhibits no edema.  Lymphadenopathy:    She has no cervical adenopathy.  Neurological: She is alert.  Skin: Skin is warm and dry.     Assessment/Plan Average risk screening colonoscopy.  Yvette Branch U 08/02/2014, 12:44 PM

## 2014-08-02 NOTE — Op Note (Signed)
COLONOSCOPY PROCEDURE REPORT  PATIENT:  Yvette Branch  MR#:  [id number] Birthdate:  29-Oct-1945, 68 y.o., female Endoscopist:  Dr. Rogene Houston, MD Referred By:  Dr. Purvis Kilts, MD  Procedure Date: 08/02/2014  Procedure:   Colonoscopy(incomplete exam limited to flexible sigmoidoscopy).  Indications: Patient is 69 year old Caucasian female was undergoing screening colonoscopy. She has intermittent LLQ abdominal pain.  Informed Consent:  The procedure and risks were reviewed with the patient and informed consent was obtained.  Medications:  Demerol 50 mg IV Versed 12 mg IV  Description of procedure:  After a digital rectal exam was performed, that colonoscope was advanced from the anus through the rectum and colon to the area of sigmoid colon which is noted to be narrowed fixed and with some exudate. Could not find the lumen to advance the scope which was then slowly and cautiously withdrawn. The mucosal surfaces were carefully surveyed utilizing scope tip to flexion to facilitate fold flattening as needed. The scope was pulled down into the rectum where a thorough exam including retroflexion was performed.  Findings:   Prep excellent. Exam limited sigmoid colon which revealed multiple diverticula luminal narrowing. Sigmoid colon was fixed and there was mucopurulent material. Could not find the lumen and therefore scope not advanced any further. Normal rectal mucosa and unremarkable anorectal junction.    Therapeutic/Diagnostic Maneuvers Performed:   None  Complications:  None  Cecal Withdrawal Time:  N/A  Impression:  Incomplete exam since patient has tortuous narrowed and fixed sigmoid colon with multiple diverticula and mucopurulent material suggestive of diverticulitis.   Recommendations:  Standard instructions given. Augmentin 875/125 mg by mouth twice a day for 10 days. Patient advised to take Colace 100 mg by mouth twice a day. Office visit in 2  weeks.  Solash Tullo U  08/02/2014 1:39 PM  CC: Dr. Hilma Favors, Betsy Coder, MD & Dr. Rayne Du ref. provider found

## 2014-08-02 NOTE — Progress Notes (Signed)
Changed to ultrathin colonoscope, colon fixed and exudate with multiple diverticulosis  at sigmoid colon suggestive of diverticulitis unable to complete, no cecal withdrawal time

## 2014-08-05 ENCOUNTER — Encounter (HOSPITAL_COMMUNITY): Payer: Self-pay | Admitting: Internal Medicine

## 2014-08-19 ENCOUNTER — Ambulatory Visit (INDEPENDENT_AMBULATORY_CARE_PROVIDER_SITE_OTHER): Payer: Medicare Other | Admitting: Internal Medicine

## 2014-08-19 ENCOUNTER — Encounter (INDEPENDENT_AMBULATORY_CARE_PROVIDER_SITE_OTHER): Payer: Self-pay | Admitting: Internal Medicine

## 2014-08-19 VITALS — BP 112/60 | HR 72 | Temp 97.8°F | Ht 62.0 in | Wt 120.7 lb

## 2014-08-19 DIAGNOSIS — IMO0001 Reserved for inherently not codable concepts without codable children: Secondary | ICD-10-CM

## 2014-08-19 DIAGNOSIS — Q434 Duplication of intestine: Secondary | ICD-10-CM

## 2014-08-19 DIAGNOSIS — K5669 Other intestinal obstruction: Secondary | ICD-10-CM

## 2014-08-19 DIAGNOSIS — K651 Peritoneal abscess: Secondary | ICD-10-CM

## 2014-08-19 DIAGNOSIS — K5732 Diverticulitis of large intestine without perforation or abscess without bleeding: Secondary | ICD-10-CM | POA: Diagnosis not present

## 2014-08-19 DIAGNOSIS — K660 Peritoneal adhesions (postprocedural) (postinfection): Secondary | ICD-10-CM | POA: Diagnosis not present

## 2014-08-19 DIAGNOSIS — Q438 Other specified congenital malformations of intestine: Secondary | ICD-10-CM

## 2014-08-19 DIAGNOSIS — T814XXS Infection following a procedure, sequela: Secondary | ICD-10-CM

## 2014-08-19 DIAGNOSIS — K56609 Unspecified intestinal obstruction, unspecified as to partial versus complete obstruction: Secondary | ICD-10-CM

## 2014-08-19 NOTE — Progress Notes (Signed)
Subjective:    Patient ID: Yvette Branch, female    DOB: December 13, 1945, 69 y.o.   MRN: [id number]  HPI Here today for f/u after recent colonoscopy which suggested diverticulitis. Started on Augmentin x 10 days. She tells me she is doing good. There is slight tenderness to her abdomen. There still is some slight left lower quadrant tenderness. She says she has been constipated. She was constipated x 1 days. Usually has a BM daily. She took Miralax last night and has gone to the [facility] x 2 day. After her colonoscopy, she was advised to take Colace daily.  Her appetite has been good. No weight loss.  She has gained 2 pounds since her visit in March.  Stools are formed.  No melena or BRRB.   08/02/2014 Colonoscopy: (incomplete exam limited to flexible sigmoidoscopy).  Indications: Patient is 69 year old Caucasian female was undergoing screening colonoscopy. She has intermittent LLQ abdominal pain. Impression:  Incomplete exam since patient has tortuous narrowed and fixed sigmoid colon with multiple diverticula and mucopurulent material suggestive of diverticulitis.   01/13/2006 Colonoscopy: constipation  Normal rectum. Mild diverticulosis in the sigmoid colon. No inflammation. No colonic or rectal polyps, Tortuous and redundant colon. Normal colon other wise . (Cecum was seen) Dr. Lindalou Hose.  Hx of intra-abdominal abscess, status post exploratory lap and lysis of adhesions. by Dr. Anthony Sar at [facility] for a small bowel obstruction 2013 She underwent a CT which revealed a large pelvis abscess. She was followed By Dr Arnoldo Morale.  She did have another SBO in 2014 and improved with conservative treatment at [facility] with Dr. Elmo Putt C. Thomas.  02/09/2013 CT abdomen/pelvis with CM: Abdominal pain ,vomiting:  IMPRESSION: Dilated stomach and small bowel consistent with small bowel obstruction which may be caused adhesions.  Review of Systems Past Medical History  Diagnosis Date  . Arthritis   .  Osteoporosis   . Bowel obstruction     Multiple  . Abdominal abscess   . Anxiety   . GERD (gastroesophageal reflux disease)   . Hot flashes, menopausal   . Malnutrition   . Intra-abdominal abscess post-procedure   . Vaginal prolapse   . Rectal prolapse   . Degenerative arthritis of lumbar spine   . Complication of anesthesia     trouble waking up with colonoscopy2007    Past Surgical History  Procedure Laterality Date  . Abdominal hysterectomy    . Abdominal surgery    . Bowel obstruction    . Tonsillectomy    . Colonoscopy N/A 08/02/2014    Procedure: COLONOSCOPY;  Surgeon: Rogene Houston, MD;  Location: [facility];  Service: Endoscopy;  Laterality: N/A;  1240    Allergies  Allergen Reactions  . Metronidazole Hives, Diarrhea and Rash    Numbness in face    Current Outpatient Prescriptions on File Prior to Visit  Medication Sig Dispense Refill  . ALPRAZolam (XANAX) 0.25 MG tablet Take 0.25 mg by mouth 4 (four) times daily as needed. For anxiety    . aspirin EC 81 MG tablet Take 81 mg by mouth daily.    . Biotin 5000 MCG TABS Take 1 tablet by mouth daily.    . Black Cohosh 40 MG CAPS Take 40 mg by mouth.    . calcium carbonate (TUMS - DOSED IN MG ELEMENTAL CALCIUM) 500 MG chewable tablet Chew 2 tablets by mouth daily.     . Coenzyme Q10 (CO Q 10 PO) Take 1 tablet by mouth daily.     Marland Kitchen  docusate sodium (COLACE) 100 MG capsule Take 1 capsule (100 mg total) by mouth 2 (two) times daily. 10 capsule 0  . glucosamine-chondroitin 500-400 MG tablet Take 2 tablets by mouth daily.    . lansoprazole (PREVACID) 30 MG capsule TAKE 1 CAPSULE BY MOUTH TWICE DAILY. 180 capsule 3  . Omega-3 Fatty Acids (FISH OIL) 1200 MG CAPS Take 1 capsule by mouth daily.    . polyethylene glycol (MIRALAX / GLYCOLAX) packet Take 17 g by mouth daily as needed. For constipation    . Probiotic Product (ALIGN) 4 MG CAPS Take 1 capsule by mouth daily. 30 capsule 0  . pyridOXINE (VITAMIN B-6) 100 MG tablet  Take 100 mg by mouth daily.    Marland Kitchen UNABLE TO FIND Take 1 tablet by mouth 4 (four) times daily. Med Name: OSTEO-SHEATH (ENHANCED BONE SUPPORT-CALCIUM SUPPLEMENT) Includes: Calcium 1221mg  and Vitamin D3 1200IU    . vitamin A 8000 UNIT capsule Take 8,000 Units by mouth daily.    . vitamin C (ASCORBIC ACID) 500 MG tablet Take 500 mg by mouth daily.    . vitamin E 400 UNIT capsule Take 400 Units by mouth daily.    . vitamin k 100 MCG tablet Take 100 mcg by mouth daily.     No current facility-administered medications on file prior to visit.        Objective:   Physical Exam Blood pressure 112/60, pulse 72, temperature 97.8 F (36.6 C), height 5\' 2"  (1.575 m), weight 120 lb 11.2 oz (54.749 kg).  Alert and oriented. Skin warm and dry. Oral mucosa is moist.   . Sclera anicteric, conjunctivae is pink. Thyroid not enlarged. No cervical lymphadenopathy. Lungs clear. Heart regular rate and rhythm.  Abdomen is soft. Bowel sounds are positive. No hepatomegaly. No abdominal masses felt. No tenderness to abdomen.  No edema to lower extremities.         Assessment & Plan:  Diverticulitis which hopefully has resolved. I  discussed with Dr. Laural Golden. Colonic carcinoma needs to be ruled out. Virtual colonoscopy.

## 2014-08-19 NOTE — Patient Instructions (Signed)
Virtual colonoscopy

## 2014-08-28 ENCOUNTER — Ambulatory Visit
Admission: RE | Admit: 2014-08-28 | Discharge: 2014-08-28 | Disposition: A | Discharge status: Home / Self Care | Payer: Medicare Other | Source: Ambulatory Visit | Attending: Internal Medicine | Admitting: Internal Medicine

## 2014-08-28 DIAGNOSIS — T814XXS Infection following a procedure, sequela: Secondary | ICD-10-CM

## 2014-08-28 DIAGNOSIS — K56609 Unspecified intestinal obstruction, unspecified as to partial versus complete obstruction: Secondary | ICD-10-CM

## 2014-08-28 DIAGNOSIS — K651 Peritoneal abscess: Secondary | ICD-10-CM

## 2014-08-28 DIAGNOSIS — K5732 Diverticulitis of large intestine without perforation or abscess without bleeding: Secondary | ICD-10-CM

## 2014-08-28 DIAGNOSIS — IMO0001 Reserved for inherently not codable concepts without codable children: Secondary | ICD-10-CM

## 2014-08-28 DIAGNOSIS — K660 Peritoneal adhesions (postprocedural) (postinfection): Secondary | ICD-10-CM

## 2014-08-28 DIAGNOSIS — Q438 Other specified congenital malformations of intestine: Secondary | ICD-10-CM

## 2014-08-30 ENCOUNTER — Encounter (INDEPENDENT_AMBULATORY_CARE_PROVIDER_SITE_OTHER): Payer: Self-pay | Admitting: *Deleted

## 2014-09-13 ENCOUNTER — Other Ambulatory Visit: Payer: Self-pay

## 2014-09-13 DIAGNOSIS — Z1231 Encounter for screening mammogram for malignant neoplasm of breast: Secondary | ICD-10-CM

## 2014-09-23 ENCOUNTER — Telehealth (INDEPENDENT_AMBULATORY_CARE_PROVIDER_SITE_OTHER): Payer: Self-pay | Admitting: *Deleted

## 2014-09-23 NOTE — Telephone Encounter (Signed)
Yvette Branch would like to speak with [name] if she would please return her call at [phone].

## 2014-09-23 NOTE — Telephone Encounter (Signed)
I have spoken with patient 

## 2014-11-14 ENCOUNTER — Ambulatory Visit
Admission: RE | Admit: 2014-11-14 | Discharge: 2014-11-14 | Disposition: A | Discharge status: Home / Self Care | Payer: Medicare Other | Source: Ambulatory Visit

## 2014-11-14 DIAGNOSIS — Z1231 Encounter for screening mammogram for malignant neoplasm of breast: Secondary | ICD-10-CM

## 2014-11-16 ENCOUNTER — Other Ambulatory Visit: Payer: Self-pay | Admitting: Obstetrics & Gynecology

## 2014-12-24 ENCOUNTER — Encounter (INDEPENDENT_AMBULATORY_CARE_PROVIDER_SITE_OTHER): Payer: Self-pay | Admitting: Internal Medicine

## 2014-12-24 ENCOUNTER — Ambulatory Visit (INDEPENDENT_AMBULATORY_CARE_PROVIDER_SITE_OTHER): Payer: Medicare Other | Admitting: Internal Medicine

## 2014-12-24 VITALS — BP 112/70 | HR 72 | Temp 98.1°F | Ht 62.0 in | Wt 119.4 lb

## 2014-12-24 DIAGNOSIS — L918 Other hypertrophic disorders of the skin: Secondary | ICD-10-CM

## 2014-12-24 NOTE — Patient Instructions (Signed)
OV as needed 

## 2014-12-24 NOTE — Progress Notes (Signed)
Subjective:    Patient ID: Yvette Branch, female    DOB: Oct 13, 1945, 69 y.o.   MRN: [id number]  HPI Presents today to discuss virtual colonoscopy. She has questions about virtual colonoscopy. She thinks she has a hemorrhoid. She usually has a BM daily. Hx of constipation and takes stool softeners for this. No melena or BRRB.  Appetite is good. No weight loss. There is no abdominal pain.  She is concerned she has a hemorroid.    08/28/2014 Virtual colonoscopy: 1. No significant abnormality on unenhanced CT of the abdomen pelvis. 2. Moderately severe rectosigmoid colon diverticulosis. 3. Question of small gallstones within the gallbladder.      08/02/2014 Colonoscopy(incomplete exam limited to flexible sigmoidoscopy).  Indications: Patient is 69 year old Caucasian female was undergoing screening colonoscopy. She has intermittent LLQ abdominal pain.  Impression:  Incomplete exam since patient has tortuous narrowed and fixed sigmoid colon with multiple diverticula and mucopurulent material suggestive of diverticulitis. She was started on Augmentin.    01/13/2006 Colonoscopy: constipation  Normal rectum. Mild diverticulosis in the sigmoid colon. No inflammation. No colonic or rectal polyps, Tortuous and redundant colon. Normal colon other wise . (Cecum was seen) Dr. Lindalou Hose.  Hx of intra-abdominal abscess, status post exploratory lap and lysis of adhesions. by Dr. Anthony Sar at [facility] for a small bowel obstruction 2013 She underwent a CT which revealed a large pelvis abscess. She was followed By Dr Arnoldo Morale.  She did have another SBO in 2014 and improved with conservative treatment at [facility] with Dr. Elmo Putt C. Thomas.     Review of Systems Past Medical History  Diagnosis Date  . Arthritis   . Osteoporosis   . Bowel obstruction     Multiple  . Abdominal abscess   . Anxiety   . GERD (gastroesophageal reflux disease)   . Hot flashes, menopausal   . Malnutrition   .  Intra-abdominal abscess post-procedure   . Vaginal prolapse   . Rectal prolapse   . Degenerative arthritis of lumbar spine   . Complication of anesthesia     trouble waking up with colonoscopy2007    Past Surgical History  Procedure Laterality Date  . Abdominal hysterectomy    . Abdominal surgery    . Bowel obstruction    . Tonsillectomy    . Colonoscopy N/A 08/02/2014    Procedure: COLONOSCOPY;  Surgeon: Rogene Houston, MD;  Location: [facility];  Service: Endoscopy;  Laterality: N/A;  1240    Allergies  Allergen Reactions  . Metronidazole Hives, Diarrhea and Rash    Numbness in face    Current Outpatient Prescriptions on File Prior to Visit  Medication Sig Dispense Refill  . ALPRAZolam (XANAX) 0.25 MG tablet Take 0.25 mg by mouth 4 (four) times daily as needed. For anxiety    . aspirin EC 81 MG tablet Take 81 mg by mouth daily.    . Biotin 5000 MCG TABS Take 1 tablet by mouth daily.    . Black Cohosh 40 MG CAPS Take 40 mg by mouth.    . calcium carbonate (TUMS - DOSED IN MG ELEMENTAL CALCIUM) 500 MG chewable tablet Chew 2 tablets by mouth daily.     . Coenzyme Q10 (CO Q 10 PO) Take 1 tablet by mouth daily.     Marland Kitchen docusate sodium (COLACE) 100 MG capsule Take 1 capsule (100 mg total) by mouth 2 (two) times daily. 10 capsule 0  . glucosamine-chondroitin 500-400 MG tablet Take 2 tablets by mouth daily.    Marland Kitchen  Omega-3 Fatty Acids (FISH OIL) 1200 MG CAPS Take 1 capsule by mouth daily.    . polyethylene glycol (MIRALAX / GLYCOLAX) packet Take 17 g by mouth daily as needed. For constipation    . Probiotic Product (ALIGN) 4 MG CAPS Take 1 capsule by mouth daily. 30 capsule 0  . pyridOXINE (VITAMIN B-6) 100 MG tablet Take 100 mg by mouth daily.    Marland Kitchen UNABLE TO FIND Take 1 tablet by mouth 4 (four) times daily. Med Name: OSTEO-SHEATH (ENHANCED BONE SUPPORT-CALCIUM SUPPLEMENT) Includes: Calcium 1221mg  and Vitamin D3 1200IU    . vitamin A 8000 UNIT capsule Take 8,000 Units by mouth daily.     . vitamin C (ASCORBIC ACID) 500 MG tablet Take 500 mg by mouth daily.    . vitamin E 400 UNIT capsule Take 400 Units by mouth daily.    . vitamin k 100 MCG tablet Take 100 mcg by mouth daily.    . lansoprazole (PREVACID) 30 MG capsule TAKE 1 CAPSULE BY MOUTH TWICE DAILY. (Patient not taking: Reported on 12/24/2014) 180 capsule 3  . PROCTOSOL HC 2.5 % rectal cream APPLY RECTALLY TWICE DAILY AS DIRECTED. (Patient not taking: Reported on 12/24/2014) 28.35 g 11   No current facility-administered medications on file prior to visit.        Objective:   Physical ExamBlood pressure 112/70, pulse 72, temperature 98.1 F (36.7 C), height 5\' 2"  (1.575 m), weight 119 lb 6.4 oz (54.159 kg). Alert and oriented. Skin warm and dry. Oral mucosa is moist.   . Sclera anicteric, conjunctivae is pink. Thyroid not enlarged. No cervical lymphadenopathy. Lungs clear. Heart regular rate and rhythm.  Abdomen is soft. Bowel sounds are positive. No hepatomegaly. No abdominal masses felt. No tenderness.  No edema to lower extremities  Patient has a skin to to anus.        Assessment & Plan:  Normal exam. Skin tag.  OV as needed

## 2015-01-09 ENCOUNTER — Other Ambulatory Visit (HOSPITAL_COMMUNITY): Payer: Self-pay | Admitting: Family Medicine

## 2015-01-09 DIAGNOSIS — Z780 Asymptomatic menopausal state: Secondary | ICD-10-CM

## 2015-01-16 ENCOUNTER — Ambulatory Visit (HOSPITAL_COMMUNITY)
Admission: RE | Admit: 2015-01-16 | Discharge: 2015-01-16 | Disposition: A | Discharge status: Home / Self Care | Payer: Medicare Other | Source: Ambulatory Visit | Attending: Family Medicine | Admitting: Family Medicine

## 2015-01-16 DIAGNOSIS — M810 Age-related osteoporosis without current pathological fracture: Secondary | ICD-10-CM | POA: Diagnosis not present

## 2015-01-16 DIAGNOSIS — Z780 Asymptomatic menopausal state: Secondary | ICD-10-CM | POA: Insufficient documentation

## 2015-01-17 ENCOUNTER — Other Ambulatory Visit (HOSPITAL_COMMUNITY): Payer: Medicare Other

## 2015-02-25 ENCOUNTER — Ambulatory Visit (INDEPENDENT_AMBULATORY_CARE_PROVIDER_SITE_OTHER): Payer: Medicare Other | Admitting: Internal Medicine

## 2015-02-25 ENCOUNTER — Encounter (INDEPENDENT_AMBULATORY_CARE_PROVIDER_SITE_OTHER): Payer: Self-pay | Admitting: Internal Medicine

## 2015-02-25 VITALS — BP 108/74 | HR 68 | Temp 97.1°F | Resp 18 | Ht 62.0 in | Wt 118.2 lb

## 2015-02-25 DIAGNOSIS — Z8719 Personal history of other diseases of the digestive system: Secondary | ICD-10-CM

## 2015-02-25 NOTE — Patient Instructions (Signed)
Call if you have abdominal pain and fever or persistent pain.

## 2015-02-25 NOTE — Progress Notes (Signed)
Presenting complaint;  History of diverticulitis. Patient wants to go over results of virtual colonoscopy.  Subjective:  Patient is 69 year old Caucasian female who is here for scheduled visit. She underwent screening colonoscopy in April this year and noted to have noncompliant sigmoid colon with multiple diverticula and changes of diverticulitis. She was treated with antibiotics. She will return for virtual colonoscopy in May 2016. These results have been reviewed with patient but she reviewed the report and had some questions. She states since she is been taking polyethylene glycol her bowels been moving every day. She denies abdominal pain rectal bleeding or melena. She is still wondering what happened her back in September 2016 when she had surgery for bowel obstruction at [facility]. She was hospitalized for 17 days. She was discharge in readmitted at [facility] and found to have an abscess requiring percutaneous drainage. She would like for me to look at those records if possible.   Current Medications: Outpatient Encounter Prescriptions as of 02/25/2015  Medication Sig  . ALPRAZolam (XANAX) 0.25 MG tablet Take 0.25 mg by mouth 4 (four) times daily as needed. For anxiety  . aspirin EC 81 MG tablet Take 81 mg by mouth daily.  . Aspirin-Acetaminophen-Caffeine (EXCEDRIN PO) Take by mouth as needed.  . Black Cohosh 40 MG CAPS Take 40 mg by mouth.  . Coenzyme Q10 (CO Q 10 PO) Take 1 tablet by mouth daily.   Marland Kitchen docusate sodium (COLACE) 100 MG capsule Take 1 capsule (100 mg total) by mouth 2 (two) times daily.  Marland Kitchen glucosamine-chondroitin 500-400 MG tablet Take 2 tablets by mouth daily.  Marland Kitchen levothyroxine (SYNTHROID, LEVOTHROID) 25 MCG tablet Take 25 mcg by mouth daily before breakfast.   . Multiple Vitamins-Minerals (HAIR/SKIN/NAILS/BIOTIN) TABS Take by mouth daily.  . Omega-3 Fatty Acids (FISH OIL) 1200 MG CAPS Take 1 capsule by mouth daily.  . polyethylene glycol (MIRALAX / GLYCOLAX) packet  Take 17 g by mouth daily as needed. For constipation  . Probiotic Product (ALIGN) 4 MG CAPS Take 1 capsule by mouth daily.  Marland Kitchen PROCTOSOL HC 2.5 % rectal cream APPLY RECTALLY TWICE DAILY AS DIRECTED.  Marland Kitchen pyridOXINE (VITAMIN B-6) 100 MG tablet Take 100 mg by mouth daily.  Marland Kitchen UNABLE TO FIND Take 1 tablet by mouth 4 (four) times daily. Med Name: OSTEO-SHEATH (ENHANCED BONE SUPPORT-CALCIUM SUPPLEMENT) Includes: Calcium 1221mg  and Vitamin D3 1200IU  . vitamin A 8000 UNIT capsule Take 8,000 Units by mouth daily.  . vitamin C (ASCORBIC ACID) 500 MG tablet Take 500 mg by mouth daily.  . vitamin E 400 UNIT capsule Take 400 Units by mouth daily.  . vitamin k 100 MCG tablet Take 100 mcg by mouth daily.  . [DISCONTINUED] Biotin 5000 MCG TABS Take 1 tablet by mouth daily.  . [DISCONTINUED] calcium carbonate (TUMS - DOSED IN MG ELEMENTAL CALCIUM) 500 MG chewable tablet Chew 2 tablets by mouth daily.   . [DISCONTINUED] lansoprazole (PREVACID) 30 MG capsule TAKE 1 CAPSULE BY MOUTH TWICE DAILY. (Patient not taking: Reported on 12/24/2014)   No facility-administered encounter medications on file as of 02/25/2015.     Objective: Blood pressure 108/74, pulse 68, temperature 97.1 F (36.2 C), temperature source Oral, resp. rate 18, height 5\' 2"  (1.575 m), weight 118 lb 3.2 oz (53.615 kg). Patient is alert and in no acute distress. Conjunctiva is pink. Sclera is nonicteric Oropharyngeal mucosa is normal. No neck masses or thyromegaly noted. Cardiac exam with regular rhythm normal S1 and S2. No murmur or gallop noted.  Lungs are clear to auscultation. Abdomen is symmetrical with lower midline scar. Abdomen is soft and nontender without organomegaly or masses. No LE edema or clubbing noted.  Lab data from 01/10/2015 Serum calcium 10.5 and albumin 5.0 WBC 6.6, H&H 12.5 and 37.5 Platelets clumped therefore count could not be obtained. TSH 4.190.  Assessment:  #1. Sigmoid diverticulitis documented on incomplete  colonoscopy of April 2016.: Evaluation was completed by virtual colonoscopy in May 2016. She is presently symptom-free and her bowels are moving regularly with polyethylene glycol. Her next screening would be in 5 years with virtual colonoscopy. #2. History of poor surgical abdominal abscess in October 2013. She possibly had post op infection.   Plan:  Patient will call for abdominal pain. Continue polyethylene glycol between 8.5-17 g daily. Continue high fiber diet. Will request records from [facility] for review. Office visit in one year.

## 2015-03-03 ENCOUNTER — Encounter (INDEPENDENT_AMBULATORY_CARE_PROVIDER_SITE_OTHER): Payer: Self-pay | Admitting: *Deleted

## 2015-10-16 ENCOUNTER — Other Ambulatory Visit: Payer: Self-pay | Admitting: Family Medicine

## 2015-10-16 DIAGNOSIS — Z1231 Encounter for screening mammogram for malignant neoplasm of breast: Secondary | ICD-10-CM

## 2015-11-17 ENCOUNTER — Ambulatory Visit
Admission: RE | Admit: 2015-11-17 | Discharge: 2015-11-17 | Disposition: A | Discharge status: Home / Self Care | Payer: Medicare Other | Source: Ambulatory Visit | Attending: Family Medicine | Admitting: Family Medicine

## 2015-11-17 DIAGNOSIS — Z1231 Encounter for screening mammogram for malignant neoplasm of breast: Secondary | ICD-10-CM

## 2015-11-27 ENCOUNTER — Encounter (INDEPENDENT_AMBULATORY_CARE_PROVIDER_SITE_OTHER): Payer: Self-pay | Admitting: Internal Medicine

## 2016-02-17 ENCOUNTER — Ambulatory Visit (INDEPENDENT_AMBULATORY_CARE_PROVIDER_SITE_OTHER): Payer: Medicare Other | Admitting: Internal Medicine

## 2016-03-30 ENCOUNTER — Encounter (INDEPENDENT_AMBULATORY_CARE_PROVIDER_SITE_OTHER): Payer: Self-pay | Admitting: Internal Medicine

## 2016-03-30 ENCOUNTER — Ambulatory Visit (INDEPENDENT_AMBULATORY_CARE_PROVIDER_SITE_OTHER): Payer: Medicare Other | Admitting: Internal Medicine

## 2016-03-30 ENCOUNTER — Encounter (INDEPENDENT_AMBULATORY_CARE_PROVIDER_SITE_OTHER): Payer: Self-pay

## 2016-03-30 VITALS — BP 110/70 | HR 68 | Temp 97.5°F | Resp 18 | Ht 62.0 in | Wt 112.6 lb

## 2016-03-30 DIAGNOSIS — Z8719 Personal history of other diseases of the digestive system: Secondary | ICD-10-CM | POA: Diagnosis not present

## 2016-03-30 DIAGNOSIS — K5900 Constipation, unspecified: Secondary | ICD-10-CM

## 2016-03-30 NOTE — Progress Notes (Signed)
Presenting complaint;  History of diverticulitis and constipation.  Subjective:  Yvette Branch 70 year old Caucasian female who is here for yearly visit. She underwent colonoscopy in April 2016 primarily for screening purposes. She was noted to have sigmoid diverticulitis and therefore examination was not completed. She was treated with antibiotics and return for virtual colonoscopy in May 2016. Study revealed multiple diverticula in sigmoid and descending colon. He had 5 mm polypoid structure and rectum which was felt to be an artifact. She has been maintained on polyethylene glycol. She is also on probiotic.  She says she is doing very well. Her bowels move every day. She denies abdominal pain melena or rectal bleeding. Her appetite is good. She has lost 6 pounds in one year. She feels weight loss is because of regular exercise. She has noted small anal tag and wants to be checked to make sure it is nothing serious. She states she takes Excedrin without aspirin.   Current Medications: Outpatient Encounter Prescriptions as of 03/30/2016  Medication Sig  . Acetaminophen-Caffeine 500-65 MG TABS Take 1 tablet by mouth daily as needed.  . ALPRAZolam (XANAX) 0.25 MG tablet Take 0.25 mg by mouth 4 (four) times daily as needed. For anxiety  . aspirin EC 81 MG tablet Take 81 mg by mouth daily.  . Black Cohosh 40 MG CAPS Take 40 mg by mouth daily.   . Coenzyme Q10 (CO Q 10 PO) Take 1 tablet by mouth daily.   Marland Kitchen docusate sodium (COLACE) 100 MG capsule Take 1 capsule (100 mg total) by mouth 2 (two) times daily.  Marland Kitchen glucosamine-chondroitin 500-400 MG tablet Take 2 tablets by mouth daily.  Marland Kitchen levothyroxine (SYNTHROID, LEVOTHROID) 25 MCG tablet Take 25 mcg by mouth daily before breakfast.   . Multiple Vitamins-Minerals (HAIR/SKIN/NAILS/BIOTIN) TABS Take by mouth daily.  . Omega-3 Fatty Acids (FISH OIL) 1200 MG CAPS Take 1 capsule by mouth daily.  . polyethylene glycol (MIRALAX / GLYCOLAX) packet Take 8.5 g by  mouth at bedtime. For constipation   . Probiotic Product (ALIGN) 4 MG CAPS Take 1 capsule by mouth daily.  Marland Kitchen PROCTOSOL HC 2.5 % rectal cream APPLY RECTALLY TWICE DAILY AS DIRECTED.  Marland Kitchen pyridOXINE (VITAMIN B-6) 100 MG tablet Take 100 mg by mouth daily.  Marland Kitchen UNABLE TO FIND Take 1 tablet by mouth 4 (four) times daily. Med Name: OSTEO-SHEATH (ENHANCED BONE SUPPORT-CALCIUM SUPPLEMENT) Includes: Calcium 1221mg  and Vitamin D3 1200IU  . vitamin A 8000 UNIT capsule Take 8,000 Units by mouth. Patient states that she takes 1-3 times per week.  . vitamin C (ASCORBIC ACID) 500 MG tablet Take 500 mg by mouth daily.  . vitamin E 400 UNIT capsule Take 400 Units by mouth daily.  . vitamin k 100 MCG tablet Take 100 mcg by mouth. Patient state that she takes 1 times per week.  . [DISCONTINUED] Aspirin-Acetaminophen-Caffeine (EXCEDRIN PO) Take by mouth as needed.   No facility-administered encounter medications on file as of 03/30/2016.      Objective: Blood pressure 110/70, pulse 68, temperature 97.5 F (36.4 C), temperature source Oral, resp. rate 18, height 5\' 2"  (1.575 m), weight 112 lb 9.6 oz (51.1 kg). Patient is alert and in no acute distress. Conjunctiva is pink. Sclera is nonicteric Oropharyngeal mucosa is normal. No neck masses or thyromegaly noted. Cardiac exam with regular rhythm normal S1 and S2. No murmur or gallop noted. Lungs are clear to auscultation. Abdomen is symmetrical soft and nontender. No organomegaly or masses. Rectal examination was limited to external inspection. She  has single small anal tag which is soft and nontender. No LE edema or clubbing noted.   Assessment:  #1. History of sigmoid diverticulitis. Last episode was over a year and a half ago. #2. Constipation. She is doing well with dietary measures and polyethylene glycol at low dose.   Plan:  Patient will continue high fiber diet, probiotic and polyethylene glycol at current dose. Unless she has problems she'll  return for office visit in one year.

## 2016-03-30 NOTE — Patient Instructions (Signed)
Notify if you have abdominal pain and fever 

## 2016-10-06 ENCOUNTER — Other Ambulatory Visit: Payer: Self-pay | Admitting: Family Medicine

## 2016-10-06 DIAGNOSIS — Z1231 Encounter for screening mammogram for malignant neoplasm of breast: Secondary | ICD-10-CM

## 2016-11-18 ENCOUNTER — Ambulatory Visit: Payer: Medicare Other

## 2016-11-18 ENCOUNTER — Ambulatory Visit
Admission: RE | Admit: 2016-11-18 | Discharge: 2016-11-18 | Disposition: A | Discharge status: Home / Self Care | Payer: Medicare Other | Source: Ambulatory Visit | Attending: Family Medicine | Admitting: Family Medicine

## 2016-11-18 DIAGNOSIS — Z1231 Encounter for screening mammogram for malignant neoplasm of breast: Secondary | ICD-10-CM

## 2016-11-22 ENCOUNTER — Ambulatory Visit: Payer: Medicare Other

## 2017-02-28 ENCOUNTER — Encounter (INDEPENDENT_AMBULATORY_CARE_PROVIDER_SITE_OTHER): Payer: Self-pay | Admitting: Internal Medicine

## 2017-04-26 ENCOUNTER — Ambulatory Visit (INDEPENDENT_AMBULATORY_CARE_PROVIDER_SITE_OTHER): Payer: Medicare Other | Admitting: Internal Medicine

## 2017-09-27 ENCOUNTER — Encounter (INDEPENDENT_AMBULATORY_CARE_PROVIDER_SITE_OTHER): Payer: Self-pay | Admitting: Internal Medicine

## 2017-09-27 ENCOUNTER — Ambulatory Visit (INDEPENDENT_AMBULATORY_CARE_PROVIDER_SITE_OTHER): Payer: Medicare Other | Admitting: Internal Medicine

## 2017-09-27 VITALS — BP 110/68 | HR 66 | Temp 97.7°F | Resp 18 | Ht 62.0 in | Wt 106.7 lb

## 2017-09-27 DIAGNOSIS — K5900 Constipation, unspecified: Secondary | ICD-10-CM

## 2017-09-27 DIAGNOSIS — L290 Pruritus ani: Secondary | ICD-10-CM | POA: Diagnosis not present

## 2017-09-27 MED ORDER — NYSTATIN-TRIAMCINOLONE 100000-0.1 UNIT/GM-% EX CREA: 1.0000 "application " | TOPICAL_CREAM | Freq: Two times a day (BID) | CUTANEOUS | 0 refills | Status: DC

## 2017-09-27 NOTE — Progress Notes (Signed)
Presenting complaint;  Follow-up for constipation.  Patient complains of perianal irritation.  History of diverticulitis.   Database and subjective:  Patient is 72 year old Caucasian female who has history of sigmoid diverticulitis and chronic constipation who is here for scheduled visit.  She was last seen in December 2017.  She was diagnosed to have sigmoid diverticulitis when she had colonoscopy in April 2016.  Colonoscopy was performed for screening purposes.  Examination was not completed because of finding of sigmoid diverticulitis.  She was treated with antibiotics and returned a month later and underwent virtual colonoscopy and no other abnormalities were found. She says she is doing well.  She says she had an episode of abdominal pain about a year ago when she thought she was constipated and took Dulcolax tablets.  She had a bowel movement and pain resolved.  She did not experience fever or chills.  She is having 1-2 formed stools daily.  She is taking stool softener daily.  She is also watching her diet.  She is using MiraLAX on as-needed basis.  She denies melena or rectal bleeding.  She has good appetite.  She has lost 6 pounds since her last visit.  She believes she has lost weight because she exercises and walks every day.  She had questions about her illness back in 2008 when she had laparotomy for small bowel obstruction she had adhesio lysis.  Few days later she presented with peritonitis and pelvic abscess.  She underwent percutaneous drainage of an abscess and antibiotic therapy and did not require any more surgeries.  I told her she may have had unrecognized injury at the time of adhesio lysis.   Current Medications: Outpatient Encounter Medications as of 09/27/2017  Medication Sig  . Acetaminophen-Caffeine 500-65 MG TABS Take 1 tablet by mouth daily as needed.  . ALPRAZolam (XANAX) 0.25 MG tablet Take 0.25 mg by mouth 4 (four) times daily as needed. For anxiety  . aspirin EC 81 MG  tablet Take 81 mg by mouth daily.  . Black Cohosh 540 MG CAPS Take 540 mg by mouth daily.   . Coenzyme Q10 (CO Q 10 PO) Take 1 tablet by mouth daily.   Marland Kitchen docusate sodium (COLACE) 100 MG capsule Take 1 capsule (100 mg total) by mouth 2 (two) times daily.  Marland Kitchen glucosamine-chondroitin 500-400 MG tablet Take 2 tablets by mouth daily.  Marland Kitchen levothyroxine (SYNTHROID, LEVOTHROID) 50 MCG tablet Take 50 mcg by mouth daily before breakfast.   . Multiple Vitamins-Minerals (HAIR/SKIN/NAILS/BIOTIN) TABS Take by mouth daily.  . Multiple Vitamins-Minerals (OCUVITE ADULT 50+) CAPS Take by mouth daily.  . Omega-3 Fatty Acids (FISH OIL) 1200 MG CAPS Take 1 capsule by mouth daily.  . polyethylene glycol (MIRALAX / GLYCOLAX) packet Take 8.5 g by mouth at bedtime. For constipation   . Probiotic Product (ALIGN) 4 MG CAPS Take 1 capsule by mouth daily.  Marland Kitchen PROCTOSOL HC 2.5 % rectal cream APPLY RECTALLY TWICE DAILY AS DIRECTED.  Marland Kitchen pyridOXINE (VITAMIN B-6) 100 MG tablet Take 100 mg by mouth daily.  Marland Kitchen UNABLE TO FIND Take 1 tablet by mouth 4 (four) times daily. Med Name: OSTEO-SHEATH (ENHANCED BONE SUPPORT-CALCIUM SUPPLEMENT) Includes: Calcium 1221mg  and Vitamin D3 1200IU  . vitamin A 8000 UNIT capsule Take 8,000 Units by mouth. Patient states that she takes 1-3 times per week.  . vitamin C (ASCORBIC ACID) 500 MG tablet Take 500 mg by mouth daily.  . vitamin E 400 UNIT capsule Take 400 Units by mouth daily.  Marland Kitchen  vitamin k 100 MCG tablet Take 100 mcg by mouth daily.    No facility-administered encounter medications on file as of 09/27/2017.      Objective: Blood pressure 110/68, pulse 66, temperature 97.7 F (36.5 C), temperature source Oral, resp. rate 18, height 5\' 2"  (1.575 m), weight 106 lb 11.2 oz (48.4 kg). Patient is alert and in no acute distress. Conjunctiva is pink. Sclera is nonicteric Oropharyngeal mucosa is normal. No neck masses or thyromegaly noted. Cardiac exam with regular rhythm normal S1 and S2. No  murmur or gallop noted. Lungs are clear to auscultation. Abdomen is symmetrical.  She has infraumbilical midline vertical scar as well as horizontal scar in her lower abdomen.  On palpation abdomen is soft and nontender without organomegaly or masses. Rectal examination limited to external inspection.  She has single soft skin tag. No LE edema or clubbing noted.   Assessment:  #1.  Chronic constipation.  She is on high fiber diet and Colace but she is using polyethylene glycol on as-needed basis.  She should take it on schedule whether it is daily or every other day so that she can prevent constipation rather than having to treated.  #2.  Perianal irritation secondary to anal skin tag.   Plan:  Patient advised to take polyethylene glycol daily or every other day. She can titrate dose between 8.5 to 17 g. Patient advised not to take stimulant laxatives by mouth but she can use Dulcolax suppository on as-needed basis. Mycolog-II cream to be applied to perianal area twice daily for 1 week and thereafter on as-needed basis. Office visit in 1 year.

## 2017-09-27 NOTE — Patient Instructions (Addendum)
Can take extra dose of polyethylene glycol or MiraLAX for constipation or use Dulcolax suppository as needed. Do not take laxatives like Senokot or Dulcolax. Use Mycolog-II cream twice daily for 1 week and thereafter as needed.

## 2017-10-03 ENCOUNTER — Other Ambulatory Visit: Payer: Self-pay | Admitting: Family Medicine

## 2017-10-03 DIAGNOSIS — Z1231 Encounter for screening mammogram for malignant neoplasm of breast: Secondary | ICD-10-CM

## 2017-11-21 ENCOUNTER — Ambulatory Visit
Admission: RE | Admit: 2017-11-21 | Discharge: 2017-11-21 | Disposition: A | Discharge status: Home / Self Care | Payer: Medicare Other | Source: Ambulatory Visit | Attending: Family Medicine | Admitting: Family Medicine

## 2017-11-21 DIAGNOSIS — Z1231 Encounter for screening mammogram for malignant neoplasm of breast: Secondary | ICD-10-CM

## 2018-09-14 ENCOUNTER — Ambulatory Visit (INDEPENDENT_AMBULATORY_CARE_PROVIDER_SITE_OTHER): Payer: Medicare Other | Admitting: Otolaryngology

## 2018-09-14 ENCOUNTER — Other Ambulatory Visit: Payer: Self-pay

## 2018-09-14 DIAGNOSIS — H6123 Impacted cerumen, bilateral: Secondary | ICD-10-CM

## 2018-09-14 DIAGNOSIS — R040 Epistaxis: Secondary | ICD-10-CM | POA: Diagnosis not present

## 2018-09-26 ENCOUNTER — Ambulatory Visit (INDEPENDENT_AMBULATORY_CARE_PROVIDER_SITE_OTHER): Payer: Medicare Other | Admitting: Internal Medicine

## 2018-10-12 ENCOUNTER — Ambulatory Visit (INDEPENDENT_AMBULATORY_CARE_PROVIDER_SITE_OTHER): Payer: Medicare Other | Admitting: Otolaryngology

## 2018-10-12 DIAGNOSIS — R040 Epistaxis: Secondary | ICD-10-CM | POA: Diagnosis not present

## 2018-11-16 ENCOUNTER — Ambulatory Visit (INDEPENDENT_AMBULATORY_CARE_PROVIDER_SITE_OTHER): Payer: Medicare Other | Admitting: Otolaryngology

## 2018-11-16 DIAGNOSIS — R040 Epistaxis: Secondary | ICD-10-CM

## 2018-11-28 ENCOUNTER — Other Ambulatory Visit: Payer: Self-pay

## 2018-11-28 ENCOUNTER — Encounter (INDEPENDENT_AMBULATORY_CARE_PROVIDER_SITE_OTHER): Payer: Self-pay | Admitting: Internal Medicine

## 2018-11-28 ENCOUNTER — Ambulatory Visit (INDEPENDENT_AMBULATORY_CARE_PROVIDER_SITE_OTHER): Payer: Medicare Other | Admitting: Internal Medicine

## 2018-11-28 DIAGNOSIS — K644 Residual hemorrhoidal skin tags: Secondary | ICD-10-CM | POA: Diagnosis not present

## 2018-11-28 DIAGNOSIS — Z8719 Personal history of other diseases of the digestive system: Secondary | ICD-10-CM | POA: Insufficient documentation

## 2018-11-28 DIAGNOSIS — K5900 Constipation, unspecified: Secondary | ICD-10-CM | POA: Diagnosis not present

## 2018-11-28 MED ORDER — POLYETHYLENE GLYCOL 3350 17 G PO PACK: 17.0000 g | PACK | Freq: Every day | ORAL | 11 refills | Status: AC

## 2018-11-28 MED ORDER — NYSTATIN-TRIAMCINOLONE 100000-0.1 UNIT/GM-% EX CREA: 1.0000 "application " | TOPICAL_CREAM | Freq: Two times a day (BID) | CUTANEOUS | 0 refills | Status: DC

## 2018-11-28 NOTE — Progress Notes (Signed)
Presenting complaint;  Chronic constipation.  History of diverticulitis.  Database and subjective:  Patient is 73 year old Caucasian female who has a history of sigmoid diverticulitis discovered at the time of colonoscopy in March 2016.  Colonoscopy could not be completed.  She was treated with antibiotics and subsequently had virtual colonoscopy.  Her history is also significant for pelvic abscess following mild surgery for small bowel obstruction requiring percutaneous drainage in 2014.  Patient has chronic constipation and is on polyethylene glycol.  Patient is here for scheduled visit.  She was last seen in June 2019.  She states she is doing well.  Her bowels move every day.  She feels she has good evacuation.  She denies melena rectal bleeding.  Her appetite is good and her weight has been stable.  She has occasional LLQ abdominal pain which is fleeting and mild.  She also complains of intermittent itching and discomfort associated with anal skin tag.  She would like to get a refill on Mycolog-II cream.  She has not had a bout of diverticulitis since March 2016. Patient states she tries to do 10,000 steps every day.  She tries to eat fiber rich foods.   Current Medications: Outpatient Encounter Medications as of 11/28/2018  Medication Sig  . Acetaminophen-Caffeine 500-65 MG TABS Take 1 tablet by mouth daily as needed.  . ALPRAZolam (XANAX) 0.25 MG tablet Take 0.25 mg by mouth 4 (four) times daily as needed. For anxiety  . aspirin EC 81 MG tablet Take 81 mg by mouth daily.  . Black Cohosh 540 MG CAPS Take 540 mg by mouth daily.   . Coenzyme Q10 (CO Q 10 PO) Take 1 tablet by mouth daily.   Marland Kitchen docusate sodium (COLACE) 100 MG capsule Take 1 capsule (100 mg total) by mouth 2 (two) times daily.  Marland Kitchen glucosamine-chondroitin 500-400 MG tablet Take 2 tablets by mouth daily.  Marland Kitchen levothyroxine (SYNTHROID, LEVOTHROID) 50 MCG tablet Take 50 mcg by mouth daily before breakfast.   . Multiple  Vitamins-Minerals (HAIR/SKIN/NAILS/BIOTIN) TABS Take by mouth daily.  . Multiple Vitamins-Minerals (OCUVITE ADULT 50+) CAPS Take by mouth daily.  Marland Kitchen nystatin-triamcinolone (MYCOLOG II) cream Apply 1 application topically 2 (two) times daily.  . Omega-3 Fatty Acids (FISH OIL) 1200 MG CAPS Take 1 capsule by mouth daily.  . polyethylene glycol (MIRALAX / GLYCOLAX) packet Take 8.5 g by mouth at bedtime. For constipation   . Probiotic Product (ALIGN) 4 MG CAPS Take 1 capsule by mouth daily.  Marland Kitchen PROCTOSOL HC 2.5 % rectal cream APPLY RECTALLY TWICE DAILY AS DIRECTED.  Marland Kitchen pyridOXINE (VITAMIN B-6) 100 MG tablet Take 100 mg by mouth daily.  Marland Kitchen UNABLE TO FIND Take 1 tablet by mouth 4 (four) times daily. Med Name: OSTEO-SHEATH (ENHANCED BONE SUPPORT-CALCIUM SUPPLEMENT) Includes: Calcium 1221mg  and Vitamin D3 1200IU  . vitamin A 8000 UNIT capsule Take 8,000 Units by mouth. Patient states that she takes 1-3 times per week.  . vitamin C (ASCORBIC ACID) 500 MG tablet Take 500 mg by mouth daily.  . vitamin E 400 UNIT capsule Take 400 Units by mouth daily.  . vitamin k 100 MCG tablet Take 100 mcg by mouth daily.    No facility-administered encounter medications on file as of 11/28/2018.      Objective: Blood pressure 118/60, pulse (!) 50, temperature 97.6 F (36.4 C), temperature source Oral, resp. rate 18, height 5\' 2"  (1.575 m), weight 108 lb (49 kg). Patient is alert and in no acute distress. Conjunctiva is pink. Sclera  is nonicteric Oropharyngeal mucosa is normal. No neck masses or thyromegaly noted. 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 Cardiac exam with regular rhythm normal S1 and S2. No murmur or gallop noted. Lungs are clear to auscultation. Abdomen is symmetrical.  She has lower midline scar.  She has free-floating left lower rib.  Her abdomen is soft and nontender with organomegaly or masses. Rectal examination was limited to external inspection.  She has 1 small anal tag.  No perianal  tenderness or induration noted. No LE edema or clubbing noted.   Assessment:  #1.  Chronic constipation.  She is doing well with dietary measures, probiotic stool softener and polyethylene glycol.  #2.  History of sigmoid diverticulitis.  Last episode was in March 2016.  #3.  Anal tags with intermittent irritation.   Plan:  New prescription given for polyethylene glycol 17 g by mouth daily.  If prescription is more cost effective than OTC she will go that route otherwise she will just use OTC prep. Mycolog-II cream to be applied to perianal area on as-needed basis but no more than 7 days in a row. Office visit in 12 months.

## 2018-11-28 NOTE — Patient Instructions (Addendum)
Can take half to 1 scoop of polyethylene glycol daily. Continue high-fiber diet. Use Mycolog-II cream on as-needed basis. Please call office if you have abdominal pain which is more than fleeting or different type.

## 2018-12-25 ENCOUNTER — Other Ambulatory Visit: Payer: Self-pay

## 2018-12-25 ENCOUNTER — Ambulatory Visit
Admission: RE | Admit: 2018-12-25 | Discharge: 2018-12-25 | Disposition: A | Discharge status: Home / Self Care | Payer: Medicare Other | Source: Ambulatory Visit | Attending: Family Medicine | Admitting: Family Medicine

## 2018-12-25 ENCOUNTER — Other Ambulatory Visit: Payer: Self-pay | Admitting: Family Medicine

## 2018-12-25 DIAGNOSIS — Z1231 Encounter for screening mammogram for malignant neoplasm of breast: Secondary | ICD-10-CM

## 2019-12-04 ENCOUNTER — Ambulatory Visit (INDEPENDENT_AMBULATORY_CARE_PROVIDER_SITE_OTHER): Payer: Medicare Other | Admitting: Internal Medicine

## 2019-12-04 ENCOUNTER — Other Ambulatory Visit: Payer: Self-pay

## 2019-12-04 ENCOUNTER — Encounter (INDEPENDENT_AMBULATORY_CARE_PROVIDER_SITE_OTHER): Payer: Self-pay | Admitting: Internal Medicine

## 2019-12-04 VITALS — BP 125/74 | HR 55 | Temp 97.3°F | Ht 62.0 in | Wt 107.5 lb

## 2019-12-04 DIAGNOSIS — K5900 Constipation, unspecified: Secondary | ICD-10-CM

## 2019-12-04 DIAGNOSIS — Z8719 Personal history of other diseases of the digestive system: Secondary | ICD-10-CM | POA: Diagnosis not present

## 2019-12-04 NOTE — Patient Instructions (Signed)
Please notify if you experience lower abdominal pain of concern to you.

## 2019-12-04 NOTE — Progress Notes (Signed)
Presenting complaint;  Follow for chronic constipation and history of diverticulitis.  Database and subjective:  Patient is 74 year old Caucasian female with history of chronic constipation and history of diverticulitis with lot episode in March 2016 discovered at the time of colonoscopy which was not completed and subsequently followed by virtual colonoscopy. She also has history of small bowel obstruction for which she had lysis of adhesions back in 2013 and ended up with postop pelvic abscess requiring percutaneous drain.  She another episode of small bowel obstruction a year later and responded to medical therapy.  She states she is doing well.  Her bowels move every day.  She may have a twinge of pain in left lower quadrant but is only for few seconds.  She is not having hematochezia or irritation due to hemorrhoids.  She has not used the cream lately.  She she is worried what would happen if she has have another surgery given complication of surgery back in 2013 when she had adhesiolysis. She has good appetite.  Her weight has been stable. She remains very active.  She walks about 10,000 steps every day.  Current Medications: Outpatient Encounter Medications as of 12/04/2019  Medication Sig  . ALPRAZolam (XANAX) 0.25 MG tablet Take 0.25 mg by mouth 4 (four) times daily as needed. For anxiety  . aspirin EC 81 MG tablet Take 81 mg by mouth daily.  . Black Cohosh 540 MG CAPS Take 540 mg by mouth daily.   . Coenzyme Q10 (CO Q 10 PO) Take 1 tablet by mouth daily.   Marland Kitchen docusate sodium (COLACE) 100 MG capsule Take 1 capsule (100 mg total) by mouth 2 (two) times daily.  Marland Kitchen glucosamine-chondroitin 500-400 MG tablet Take 2 tablets by mouth daily.  Marland Kitchen levothyroxine (SYNTHROID, LEVOTHROID) 50 MCG tablet Take 50 mcg by mouth daily before breakfast.   . Multiple Vitamins-Minerals (HAIR/SKIN/NAILS/BIOTIN) TABS Take by mouth daily.  . Multiple Vitamins-Minerals (OCUVITE ADULT 50+) CAPS Take by mouth daily.   Marland Kitchen nystatin-triamcinolone (MYCOLOG II) cream Apply 1 application topically 2 (two) times daily.  . Omega-3 Fatty Acids (FISH OIL) 1200 MG CAPS Take 1 capsule by mouth daily.  . polyethylene glycol (MIRALAX / GLYCOLAX) 17 g packet Take 17 g by mouth at bedtime. For constipation  . Probiotic Product (ALIGN) 4 MG CAPS Take 1 capsule by mouth daily.  Marland Kitchen pyridOXINE (VITAMIN B-6) 100 MG tablet Take 100 mg by mouth daily.  Marland Kitchen UNABLE TO FIND Take 1 tablet by mouth 4 (four) times daily. Med Name: OSTEO-SHEATH (ENHANCED BONE SUPPORT-CALCIUM SUPPLEMENT) Includes: Calcium 1221mg  and Vitamin D3 1200IU  . vitamin A 8000 UNIT capsule Take 8,000 Units by mouth. Patient states that she takes 1-3 times per week.  . vitamin C (ASCORBIC ACID) 500 MG tablet Take 500 mg by mouth daily.  . vitamin E 400 UNIT capsule Take 400 Units by mouth daily.  . vitamin k 100 MCG tablet Take 100 mcg by mouth daily.   . Acetaminophen-Caffeine 500-65 MG TABS Take 1 tablet by mouth daily as needed.   No facility-administered encounter medications on file as of 12/04/2019.     Objective: Blood pressure 125/74, pulse (!) 55, temperature (!) 97.3 F (36.3 C), height 5\' 2"  (1.575 m), weight 107 lb 8 oz (48.8 kg). Patient is alert and in no acute distress. She is wearing a mask. Conjunctiva is pink. Sclera is nonicteric Oropharyngeal mucosa is normal. No neck masses or thyromegaly noted. Cardiac exam with regular rhythm normal S1 and S2. No  murmur or gallop noted. Lungs are clear to auscultation. Abdomen is symmetrical.  Lower midline scar.  On palpation abdomen is soft and nontender with organomegaly or masses. No LE edema or clubbing noted.   Assessment:  #1.  Chronic constipation.  She is doing well with dietary measures Colace and polyethylene glycol.  She will continue this regimen as long as it is working.  #2.  History of sigmoid diverticulitis.  Last episode was more than 5 years ago.  She has change her eating habits.   She eats very little in the way of red meat and she is also on probiotic.  Therefore colonic flora should be very healthy and protective.  She should try not to take NSAIDs unless absolutely necessary.   Plan:  Patient will continue Colace and polyethylene glycol as before. Patient will call if she has lower abdominal pain. Office visit in 1 year.

## 2019-12-15 IMAGING — MG MM DIGITAL SCREENING BILAT W/ TOMO W/ CAD
6 of 10 series · 6 of 30 positions shown · non-contrast
Comparison: Previous exam(s).

CLINICAL DATA: Screening.

EXAM:
DIGITAL SCREENING BILATERAL MAMMOGRAM WITH TOMO AND CAD

[R MLO synth-2D]
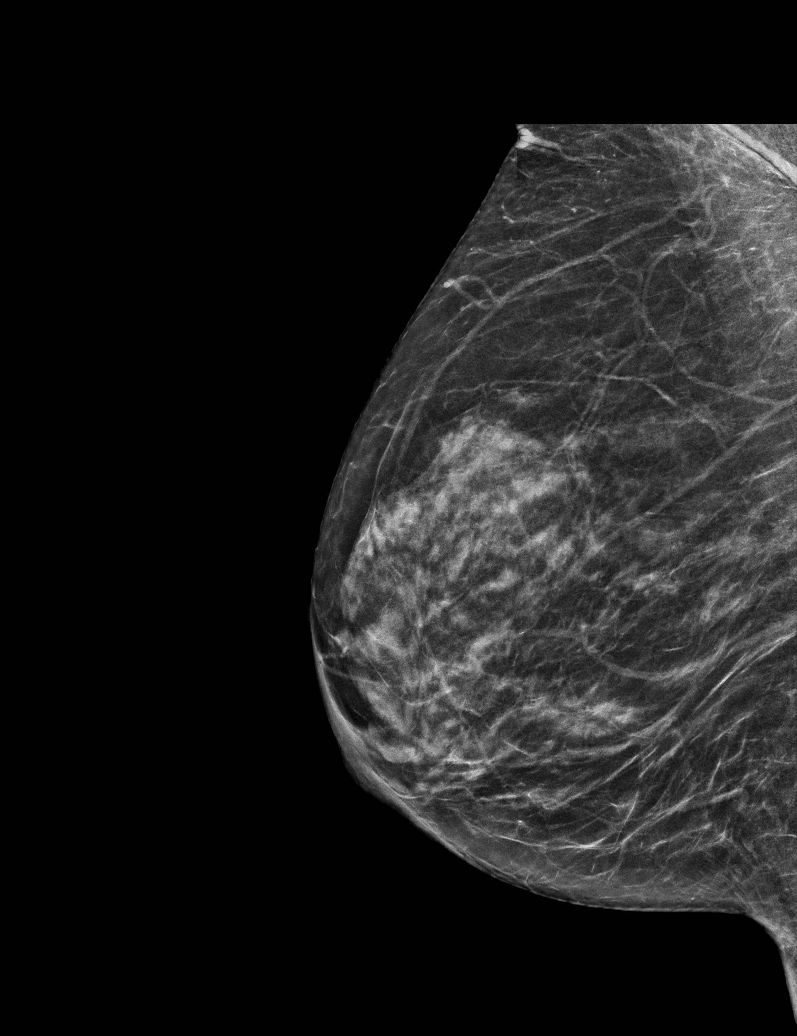

[R CC synth-2D (1 of 2)]
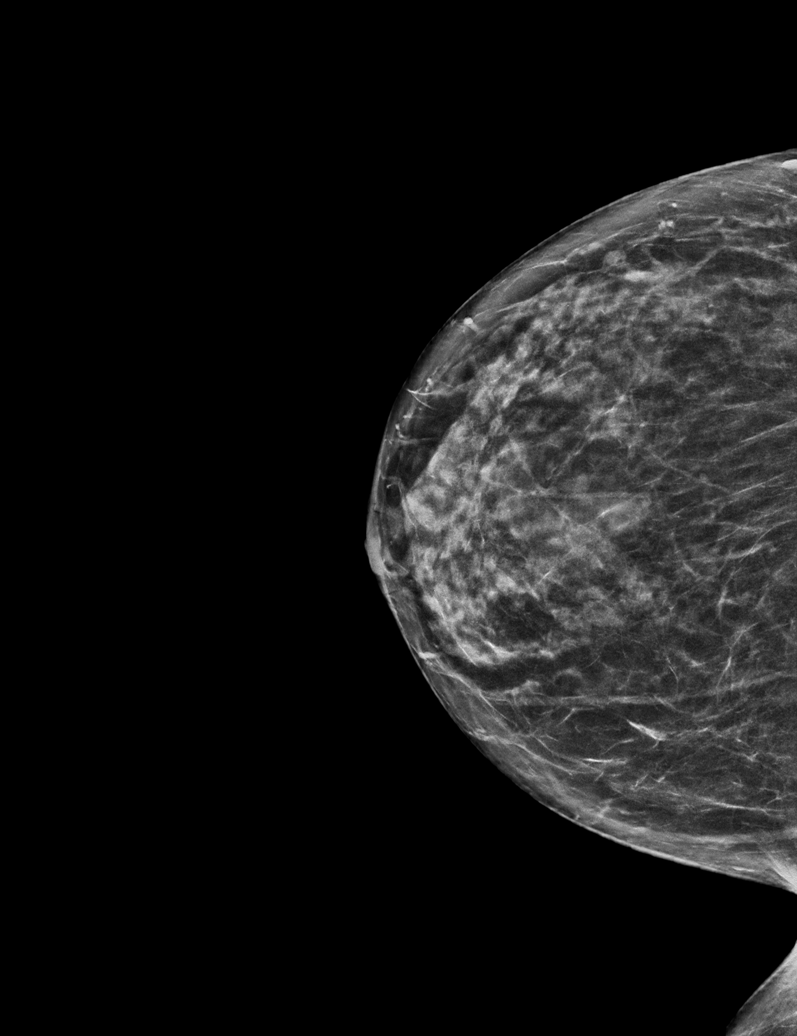

[L CC synth-2D]
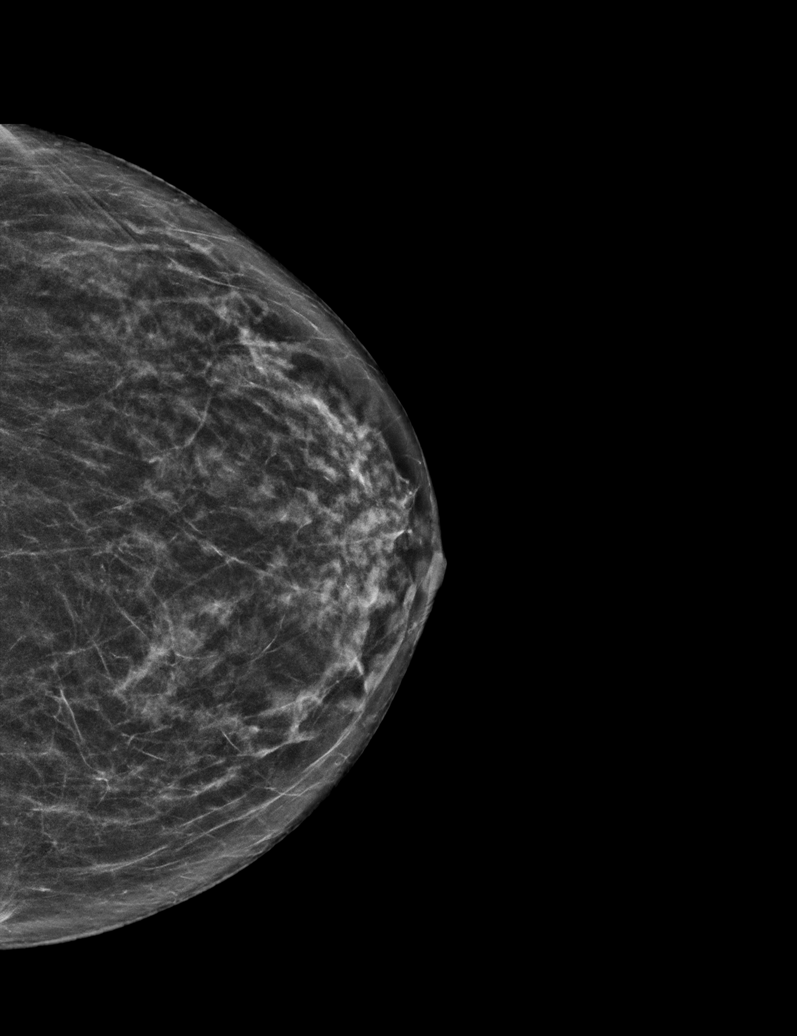

[L MLO synth-2D]
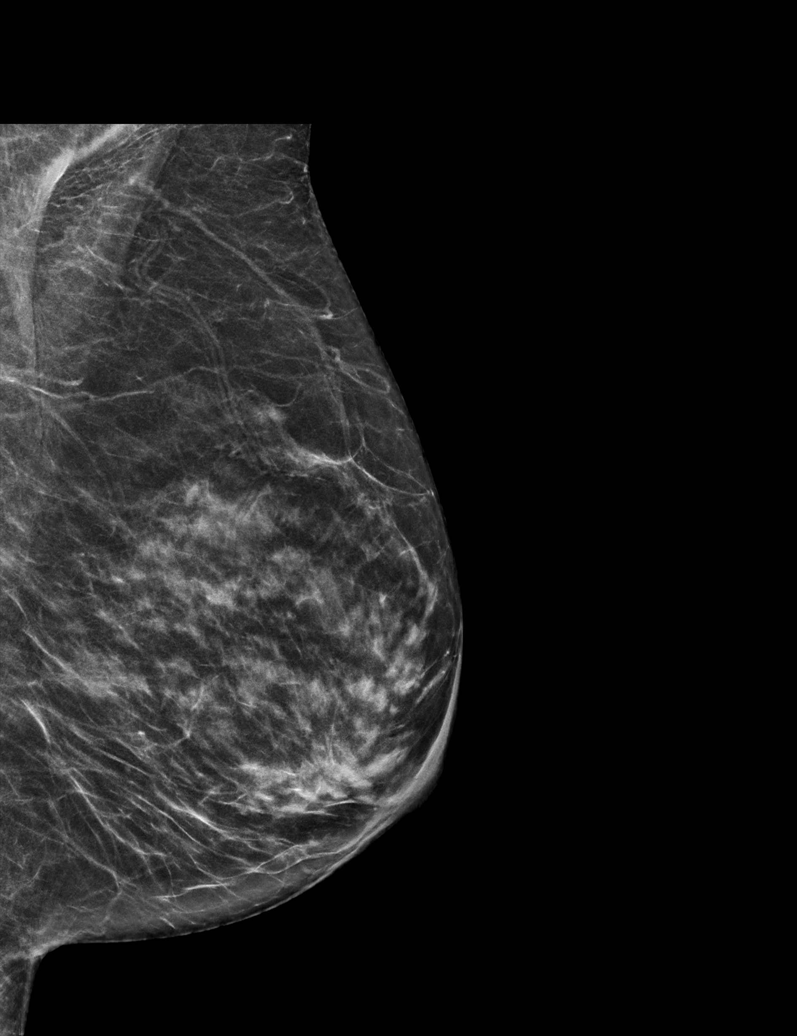

[R CC synth-2D (2 of 2)]
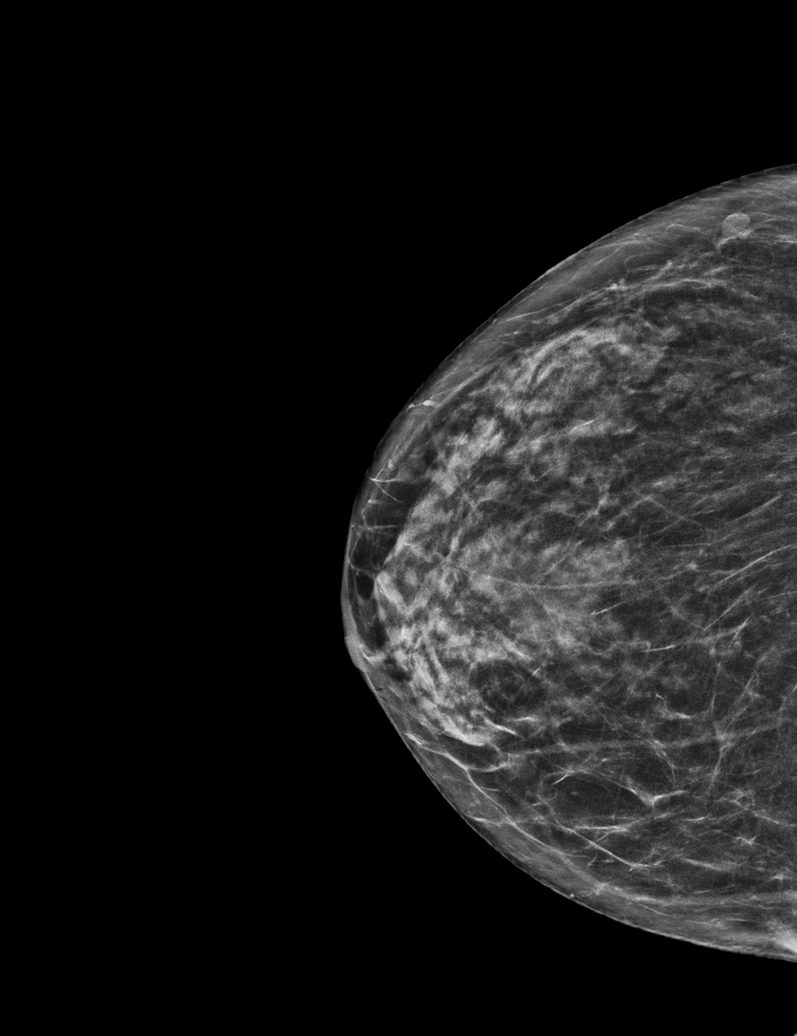

[L CC tomo · tomo slice 26/51.0]
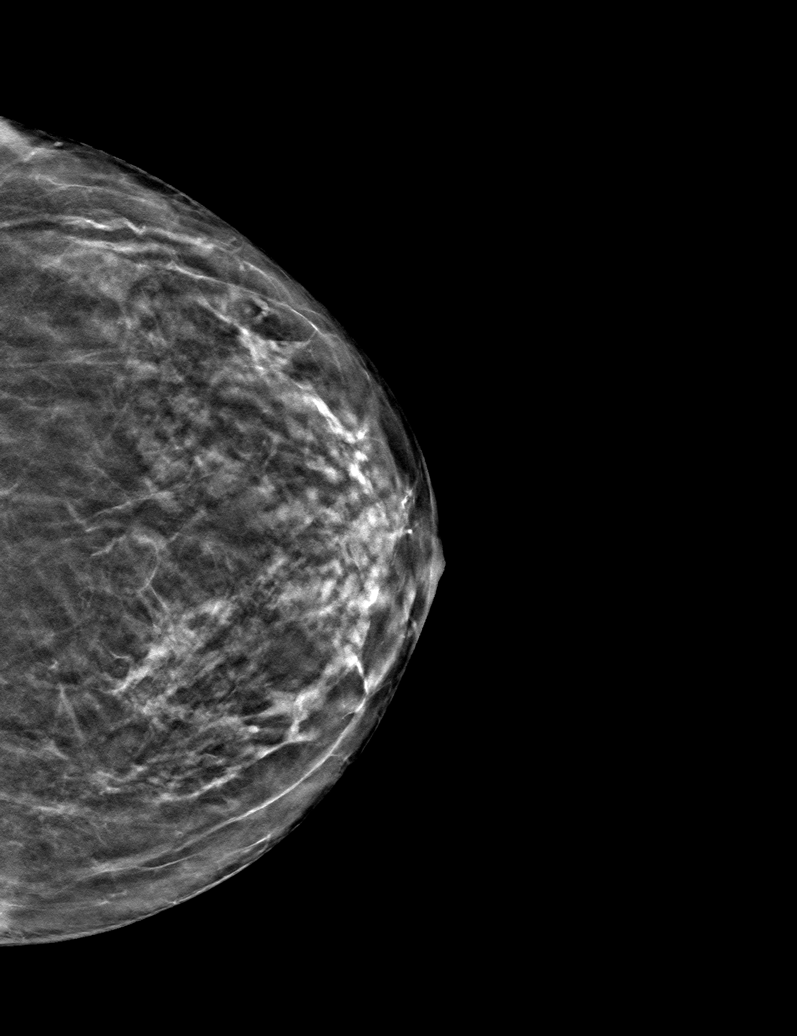

[6 of 30 positions shown; findings below may reference images not displayed]

ACR Breast Density Category c: The breast tissue is heterogeneously
dense, which may obscure small masses.
FINDINGS: There are no findings suspicious for malignancy. Images were
processed with CAD.
IMPRESSION: No mammographic evidence of malignancy. A result letter of this
screening mammogram will be mailed directly to the patient.

RECOMMENDATION:
Screening mammogram in one year. (Code:FT-U-LHB)

BI-RADS CATEGORY  1: Negative.

## 2019-12-26 ENCOUNTER — Other Ambulatory Visit: Payer: Self-pay | Admitting: Family Medicine

## 2019-12-26 DIAGNOSIS — Z1231 Encounter for screening mammogram for malignant neoplasm of breast: Secondary | ICD-10-CM

## 2019-12-27 ENCOUNTER — Ambulatory Visit
Admission: RE | Admit: 2019-12-27 | Discharge: 2019-12-27 | Disposition: A | Discharge status: Home / Self Care | Payer: Medicare Other | Source: Ambulatory Visit | Attending: Family Medicine | Admitting: Family Medicine

## 2019-12-27 ENCOUNTER — Other Ambulatory Visit: Payer: Self-pay

## 2019-12-27 DIAGNOSIS — Z1231 Encounter for screening mammogram for malignant neoplasm of breast: Secondary | ICD-10-CM

## 2020-12-04 ENCOUNTER — Other Ambulatory Visit: Payer: Self-pay

## 2020-12-04 ENCOUNTER — Ambulatory Visit (INDEPENDENT_AMBULATORY_CARE_PROVIDER_SITE_OTHER): Payer: Medicare Other | Admitting: Gastroenterology

## 2020-12-04 ENCOUNTER — Encounter (INDEPENDENT_AMBULATORY_CARE_PROVIDER_SITE_OTHER): Payer: Self-pay | Admitting: Gastroenterology

## 2020-12-04 VITALS — BP 125/75 | HR 65 | Temp 97.8°F | Ht 62.0 in | Wt 104.9 lb

## 2020-12-04 DIAGNOSIS — Z8719 Personal history of other diseases of the digestive system: Secondary | ICD-10-CM

## 2020-12-04 DIAGNOSIS — K5900 Constipation, unspecified: Secondary | ICD-10-CM

## 2020-12-04 NOTE — Patient Instructions (Signed)
Continue using stool softeners twice a day and miralax daily as you are currently doing.  Please let us know if you start having severe abdominal pain, diarrhea or rectal bleeding.

## 2020-12-04 NOTE — Progress Notes (Addendum)
Referring Provider: Sharilyn Sites, MD Primary Care Physician:  Sharilyn Sites, MD Primary GI Physician: Rehman  Chief Complaint  Patient presents with   Follow-up    Patient is here today for a follow up . She has had two bowel blockages in the past. She states she has no current Gi issues to discuss today.   HPI:   Yvette Branch is a 75 y.o. female with past medical history of anxiety, arthritis, bowel obstruction, GERD, malnutrition, osteoporosis, vaginal and rectal prolapse.  Patient presenting today for follow up of constipation/history of diverticulitis.  History of diverticulitis with complications. Episode of diverticulitis in march 2016 discovered at time of colonoscopy which was not completed and subsequently followed by virtual colonoscopy. Also has history of SBO for which she had lysis of adhesions back in 2013 and ended up with postop pelvic abscess requiring purcutaneous drain. Another SBO a year later with response to medical therapy.   Chronic constipation: currently using colace BID and 1 capful miralax daily with good result. She is having a BM daily which she feels are sufficient. She also eats [place] and takes magnesium occasionally as needed if she feels like she has not moved her bowels sufficiently. Reports very rare abdominal pain, occasional twinge in LLQ. She denies melena or hematochezia, no diarrhea, nausea or vomiting. She gets plenty of exercise walking about 10,000 steps per day.Takes a baby aspirin daily, no other NSAID use. No alcohol or tobacco use. Denies any unintentional weight loss or changes in her appetite.   Last Colonoscopy:08/02/14  Last Endoscopy:n/a  Past Medical History:  Diagnosis Date   Abdominal abscess    Anxiety    Arthritis    Bowel obstruction (HCC)    Multiple   Complication of anesthesia    trouble waking up with colonoscopy2007   Degenerative arthritis of lumbar spine    GERD (gastroesophageal reflux disease)    Hot  flashes, menopausal    Intra-abdominal abscess post-procedure    Malnutrition ([place])    Osteoporosis    Rectal prolapse    Vaginal prolapse     Past Surgical History:  Procedure Laterality Date   ABDOMINAL HYSTERECTOMY     ABDOMINAL SURGERY     bowel obstruction     COLONOSCOPY N/A 08/02/2014   Procedure: COLONOSCOPY;  Surgeon: Rogene Houston, MD;  Location: [facility];  Service: Endoscopy;  Laterality: N/A;  1240   TONSILLECTOMY      Current Outpatient Medications  Medication Sig Dispense Refill   Acetaminophen-Caffeine 500-65 MG TABS Take 1 tablet by mouth daily as needed.     ALPRAZolam (XANAX) 0.25 MG tablet Take 0.25 mg by mouth 4 (four) times daily as needed. For anxiety     aspirin EC 81 MG tablet Take 81 mg by mouth daily.     Black Cohosh 540 MG CAPS Take 540 mg by mouth daily.      Coenzyme Q10 (CO Q 10 PO) Take 1 tablet by mouth daily.      docusate sodium (COLACE) 100 MG capsule Take 1 capsule (100 mg total) by mouth 2 (two) times daily. 10 capsule 0   glucosamine-chondroitin 500-400 MG tablet Take 2 tablets by mouth daily.     levothyroxine (SYNTHROID, LEVOTHROID) 50 MCG tablet Take 50 mcg by mouth daily before breakfast.   4   Multiple Vitamins-Minerals (HAIR/SKIN/NAILS/BIOTIN) TABS Take by mouth daily.     Multiple Vitamins-Minerals (OCUVITE ADULT 50+) CAPS Take by mouth daily.  nystatin-triamcinolone (MYCOLOG II) cream Apply 1 application topically 2 (two) times daily. (Patient taking differently: Apply 1 application topically daily as needed.) 30 g 0   Omega-3 Fatty Acids (FISH OIL) 1200 MG CAPS Take 1 capsule by mouth daily.     polyethylene glycol (MIRALAX / GLYCOLAX) 17 g packet Take 17 g by mouth at bedtime. For constipation 30 each 11   Probiotic Product (ALIGN) 4 MG CAPS Take 1 capsule by mouth daily. 30 capsule 0   pyridOXINE (VITAMIN B-6) 100 MG tablet Take 100 mg by mouth daily.     UNABLE TO FIND Take 1 tablet by mouth 4 (four) times daily. Med Name:  OSTEO-SHEATH (ENHANCED BONE SUPPORT-CALCIUM SUPPLEMENT) Includes: Calcium '1221mg'$  and Vitamin D3 1200IU     vitamin A 8000 UNIT capsule Take 8,000 Units by mouth. Patient states that she takes 1-3 times per week.     vitamin C (ASCORBIC ACID) 500 MG tablet Take 500 mg by mouth daily.     vitamin E 400 UNIT capsule Take 400 Units by mouth daily.     vitamin k 100 MCG tablet Take 100 mcg by mouth daily.      No current facility-administered medications for this visit.    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   Drug use: No   Sexual activity: Yes    Birth control/protection: Surgical  Other Topics Concern   Not on file  Social History Narrative   Single.  Unmarried, no children.  Lives alone. Independent of ADLs, ambulation.   Social Determinants of Health   Financial Resource Strain: Not on file  Food Insecurity: Not on file  Transportation Needs: Not on file  Physical Activity: Not on file  Stress: Not on file  Social Connections: Not on file    Review of Systems: Gen: Denies fever, chills, anorexia. Denies fatigue, weakness, weight loss.  CV: Denies chest pain, palpitations, syncope, peripheral edema, and claudication. Resp: Denies dyspnea at rest, cough, wheezing, coughing up blood,  and pleurisy. GI: Denies vomiting blood, jaundice, and fecal incontinence. Denies dysphagia or odynophagia. Derm: Denies rash, itching, dry skin Psych: Denies depression, anxiety, memory loss, confusion. No homicidal or suicidal ideation.  Heme: Denies bruising, bleeding, and enlarged lymph nodes.  Physical Exam: BP 125/75 (BP Location: Left Arm, Patient Position: Sitting, Cuff Size: Small)   Pulse 65   Temp 97.8 F (36.6 C) (Oral)   Ht '5\' 2"'$  (1.575 m)   Wt 104 lb 14.4 oz (47.6 kg)   BMI 19.19 kg/m  General:   Alert and oriented. No distress noted. Pleasant and cooperative.  Head:  Normocephalic and atraumatic. Eyes:  Conjuctiva clear without scleral icterus. Mouth:  Oral mucosa pink and moist. Good dentition. No lesions. Heart: Normal rate and rhythm, s1 and s2 heart sounds present.  Lungs: Clear lung sounds in all lobes. Respirations equal and unlabored. Abdomen:  +BS, soft, non-tender and non-distended. No rebound or guarding. No HSM or masses noted. Derm: No palmar erythema or jaundice Msk:  Symmetrical without gross deformities. Normal posture. Extremities:  Without edema. Neurologic:  Alert and  oriented x4 Psych:  Alert and cooperative. Normal mood and affect.   ASSESSMENT: Yvette Branch is a 75 y.o. female presenting today for follow up of constipation and history of complicated diverticulitis.  She is doing well overall, having a BM daily with 2 colace and miralax daily. She reports very rare occurrences of abdominal pain. She does [place] and occasionally magnesium if she feels like she has not had a good BM. She reports very rare abdominal pain. Denies diarrhea, melena, rectal bleeding, nausea, vomiting, early satiety, changes in appetite or weight loss.  We discussed hospital indications and patient is aware she should let us know if she begins to have frequent abdominal pain, diarrhea or rectal bleeding given her history.  PLAN:  Continue stool softeners twice a  day 2. Continue miralax daily 3. Let us know if you begin having abd pain, rectal bleeding or diarrhea  Follow up 1 year  Case discussed with Dr. Laural Golden who is in agreement with plan of care, as outlined above.   Quamere Mussell L. Alver Sorrow, MSN, APRN, AGNP-C Adult-Gerontology Nurse Practitioner [facility]

## 2020-12-08 ENCOUNTER — Other Ambulatory Visit (HOSPITAL_COMMUNITY): Payer: Self-pay | Admitting: Family Medicine

## 2020-12-08 DIAGNOSIS — Z1231 Encounter for screening mammogram for malignant neoplasm of breast: Secondary | ICD-10-CM

## 2020-12-29 ENCOUNTER — Ambulatory Visit: Payer: Medicare Other

## 2020-12-29 ENCOUNTER — Ambulatory Visit (HOSPITAL_COMMUNITY): Payer: Medicare Other

## 2020-12-30 ENCOUNTER — Ambulatory Visit
Admission: RE | Admit: 2020-12-30 | Discharge: 2020-12-30 | Disposition: A | Discharge status: Home / Self Care | Payer: Medicare Other | Source: Ambulatory Visit | Attending: Family Medicine | Admitting: Family Medicine

## 2020-12-30 ENCOUNTER — Other Ambulatory Visit: Payer: Self-pay

## 2020-12-30 DIAGNOSIS — Z1231 Encounter for screening mammogram for malignant neoplasm of breast: Secondary | ICD-10-CM

## 2021-10-28 ENCOUNTER — Encounter (INDEPENDENT_AMBULATORY_CARE_PROVIDER_SITE_OTHER): Payer: Self-pay | Admitting: Gastroenterology

## 2021-11-19 ENCOUNTER — Other Ambulatory Visit: Payer: Self-pay | Admitting: Family Medicine

## 2021-11-19 DIAGNOSIS — Z1231 Encounter for screening mammogram for malignant neoplasm of breast: Secondary | ICD-10-CM

## 2021-12-17 ENCOUNTER — Ambulatory Visit (INDEPENDENT_AMBULATORY_CARE_PROVIDER_SITE_OTHER): Payer: Medicare Other | Admitting: Gastroenterology

## 2021-12-23 ENCOUNTER — Encounter (INDEPENDENT_AMBULATORY_CARE_PROVIDER_SITE_OTHER): Payer: Self-pay | Admitting: Gastroenterology

## 2022-01-01 ENCOUNTER — Emergency Department (HOSPITAL_COMMUNITY): Payer: Medicare Other

## 2022-01-01 ENCOUNTER — Emergency Department (HOSPITAL_COMMUNITY)
Admission: EM | Admit: 2022-01-01 | Discharge: 2022-01-01 | Disposition: A | Discharge status: Home / Self Care | Payer: Medicare Other | Attending: Emergency Medicine | Admitting: Emergency Medicine

## 2022-01-01 ENCOUNTER — Encounter (HOSPITAL_COMMUNITY): Payer: Self-pay | Admitting: *Deleted

## 2022-01-01 DIAGNOSIS — R1084 Generalized abdominal pain: Secondary | ICD-10-CM | POA: Diagnosis not present

## 2022-01-01 DIAGNOSIS — Z7982 Long term (current) use of aspirin: Secondary | ICD-10-CM | POA: Insufficient documentation

## 2022-01-01 DIAGNOSIS — R197 Diarrhea, unspecified: Secondary | ICD-10-CM | POA: Insufficient documentation

## 2022-01-01 LAB — COMPREHENSIVE METABOLIC PANEL
ALT: 19 U/L (ref 0–44)
AST: 30 U/L (ref 15–41)
Albumin: 4.7 g/dL (ref 3.5–5.0)
Alkaline Phosphatase: 47 U/L (ref 38–126)
Anion gap: 9 (ref 5–15)
BUN: 15 mg/dL (ref 8–23)
CO2: 28 mmol/L (ref 22–32)
Calcium: 9.9 mg/dL (ref 8.9–10.3)
Chloride: 101 mmol/L (ref 98–111)
Creatinine, Ser: 0.74 mg/dL (ref 0.44–1.00)
GFR, Estimated: >60 mL/min (ref >60)
Glucose, Bld: 115 mg/dL — ABNORMAL HIGH (ref 70–99)
Potassium: 4.3 mmol/L (ref 3.5–5.1)
Sodium: 138 mmol/L (ref 135–145)
Total Bilirubin: 0.9 mg/dL (ref 0.3–1.2)
Total Protein: 7.5 g/dL (ref 6.5–8.1)

## 2022-01-01 LAB — URINALYSIS, ROUTINE W REFLEX MICROSCOPIC
Bilirubin Urine: NEGATIVE
Glucose, UA: NEGATIVE mg/dL
Hgb urine dipstick: NEGATIVE
Ketones, ur: 5 mg/dL — AB
Leukocytes,Ua: NEGATIVE
Nitrite: NEGATIVE
Protein, ur: NEGATIVE mg/dL
Specific Gravity, Urine: 1.013 (ref 1.005–1.030)
pH: 6 (ref 5.0–8.0)

## 2022-01-01 LAB — CBC
HCT: 42 % (ref 36.0–46.0)
Hemoglobin: 13.4 g/dL (ref 12.0–15.0)
MCH: 29.8 pg (ref 26.0–34.0)
MCHC: 31.9 g/dL (ref 30.0–36.0)
MCV: 93.3 fL (ref 80.0–100.0)
Platelets: 239 10*3/uL (ref 150–400)
RBC: 4.5 MIL/uL (ref 3.87–5.11)
RDW: 13.4 % (ref 11.5–15.5)
WBC: 5.7 10*3/uL (ref 4.0–10.5)
nRBC: 0 % (ref 0.0–0.2)

## 2022-01-01 LAB — LIPASE, BLOOD: Lipase: 38 U/L (ref 11–51)

## 2022-01-01 MED ORDER — IOHEXOL 300 MG/ML  SOLN
100.0000 mL | Freq: Once | INTRAMUSCULAR | Status: AC | PRN
  Administered 2022-01-01: 100 mL via INTRAVENOUS

## 2022-01-01 NOTE — ED Triage Notes (Signed)
Pt in c/o bil lower abd pain onset yesterday, pt reports hx of bowel obstructions and abcesses, pt denies current n/v, pt reports taking Miralax for constipation and now has loose stools, LBM today, A&O x4

## 2022-01-01 NOTE — ED Provider Notes (Signed)
[facility] EMERGENCY DEPARTMENT Provider Note   CSN: [id number] Arrival date & time: 01/01/22  1423     History  Chief Complaint  Patient presents with   Abdominal Pain    Yvette Branch is a 76 y.o. female.  With past medical history of SBO, diverticulitis, osteoporosis who presents to the emergency department with abdominal pain.  Patient states that yesterday morning she woke up with severe generalized abdominal pain.  States that she took her stool softener and laxative and had only a small amount of output.  States that she felt like she was not emptying her bowels.  Keep her symptoms under control yesterday and woke up again with some abdominal pain this morning.  States that she took an increased dose of her laxative and 2 stool softeners with good output.  States that she has had loose stools this morning that have become more formed as the day has gone on.  States that her abdominal pain has improved since then but she is concerned that she may have a small bowel obstruction or diverticulitis given her history.  She denies having any nausea or vomiting or fevers.  Denies dysuria.  Denies bloody stool.  States that she was feeling bloated which has somewhat improved since she has had multiple bowel movements today.  HPI     Home Medications Prior to Admission medications   Medication Sig Start Date End Date Taking? Authorizing Provider  Acetaminophen-Caffeine 500-65 MG TABS Take 1 tablet by mouth daily as needed.    [provider]  ALPRAZolam Duanne Moron) 0.25 MG tablet Take 0.25 mg by mouth 4 (four) times daily as needed. For anxiety    [provider]  aspirin EC 81 MG tablet Take 81 mg by mouth daily.    [provider]  Black Cohosh 540 MG CAPS Take 540 mg by mouth daily.     [provider]  Coenzyme Q10 (CO Q 10 PO) Take 1 tablet by mouth daily.     [provider]  docusate sodium (COLACE) 100 MG capsule Take 1 capsule (100 mg  total) by mouth 2 (two) times daily. 08/02/14   Rogene Houston, MD  glucosamine-chondroitin 500-400 MG tablet Take 2 tablets by mouth daily.    [provider]  levothyroxine (SYNTHROID, LEVOTHROID) 50 MCG tablet Take 50 mcg by mouth daily before breakfast.  02/17/15   [provider]  Multiple Vitamins-Minerals (HAIR/SKIN/NAILS/BIOTIN) TABS Take by mouth daily.    [provider]  Multiple Vitamins-Minerals (OCUVITE ADULT 50+) CAPS Take by mouth daily.    [provider]  nystatin-triamcinolone (MYCOLOG II) cream Apply 1 application topically 2 (two) times daily. Patient taking differently: Apply 1 application topically daily as needed. 11/28/18   Rehman, Mechele Dawley, MD  Omega-3 Fatty Acids (FISH OIL) 1200 MG CAPS Take 1 capsule by mouth daily.    [provider]  polyethylene glycol (MIRALAX / GLYCOLAX) 17 g packet Take 17 g by mouth at bedtime. For constipation 11/28/18   Rogene Houston, MD  Probiotic Product (ALIGN) 4 MG CAPS Take 1 capsule by mouth daily. 01/18/12   Kathie Dike, MD  pyridOXINE (VITAMIN B-6) 100 MG tablet Take 100 mg by mouth daily.    [provider]  UNABLE TO FIND Take 1 tablet by mouth 4 (four) times daily. Med Name: OSTEO-SHEATH (ENHANCED BONE SUPPORT-CALCIUM SUPPLEMENT) Includes: Calcium '1221mg'$  and Vitamin D3 1200IU    [provider]  vitamin A 8000 UNIT capsule  Take 8,000 Units by mouth. Patient states that she takes 1-3 times per week.    [provider]  vitamin C (ASCORBIC ACID) 500 MG tablet Take 500 mg by mouth daily.    [provider]  vitamin E 400 UNIT capsule Take 400 Units by mouth daily.    [provider]  vitamin k 100 MCG tablet Take 100 mcg by mouth daily.     [provider]      Allergies    Metronidazole    Review of Systems   Review of Systems  Constitutional:  Negative for fever.  Gastrointestinal:  Positive for abdominal pain, constipation and  diarrhea. Negative for blood in stool, nausea and vomiting.  Genitourinary:  Negative for dysuria.  All other systems reviewed and are negative.   Physical Exam Updated Vital Signs BP 124/60   Pulse (!) 52   Temp 98.1 F (36.7 C) (Oral)   Resp 17   Ht '5\' 2"'$  (1.575 m)   Wt 48.4 kg   SpO2 100%   BMI 19.52 kg/m  Physical Exam Vitals and nursing note reviewed.  Constitutional:      General: She is not in acute distress.    Appearance: Normal appearance. She is normal weight. She is not ill-appearing or toxic-appearing.  HENT:     Head: Normocephalic.  Eyes:     General: No scleral icterus.    Extraocular Movements: Extraocular movements intact.  Cardiovascular:     Rate and Rhythm: Normal rate and regular rhythm.     Pulses: Normal pulses.  Pulmonary:     Effort: Pulmonary effort is normal. No respiratory distress.  Abdominal:     General: Abdomen is flat. Bowel sounds are normal. There is no distension.     Palpations: Abdomen is soft.     Tenderness: There is abdominal tenderness in the periumbilical area and left lower quadrant. There is no guarding or rebound.  Musculoskeletal:        General: Normal range of motion.     Cervical back: Neck supple.  Skin:    General: Skin is warm and dry.     Capillary Refill: Capillary refill takes less than 2 seconds.  Neurological:     General: No focal deficit present.     Mental Status: She is alert and oriented to person, place, and time. Mental status is at baseline.  Psychiatric:        Mood and Affect: Mood normal.        Behavior: Behavior normal.        Thought Content: Thought content normal.        Judgment: Judgment normal.    ED Results / Procedures / Treatments   Labs (all labs ordered are listed, but only abnormal results are displayed) Labs Reviewed  COMPREHENSIVE METABOLIC PANEL - Abnormal; Notable for the following components:      Result Value   Glucose, Bld 115 (*)    All other components within normal  limits  LIPASE, BLOOD  CBC  URINALYSIS, ROUTINE W REFLEX MICROSCOPIC    EKG None  Radiology No results found.  Procedures Procedures    Medications Ordered in ED Medications - No data to display  ED Course/ Medical Decision Making/ A&P                           Medical Decision Making Amount and/or Complexity of Data Reviewed Labs: ordered. Radiology: ordered.  This  patient presents to the ED with chief complaint(s) of *** with pertinent past medical history of *** which further complicates the presenting complaint. The complaint involves an extensive differential diagnosis and also carries with it a high risk of complications and morbidity.    The differential diagnosis includes ***   Additional history obtained: Additional history obtained from {additional history:26846} Records reviewed {records:26847}  ED Course and Reassessment:   Independent labs interpretation:  The following labs were independently interpreted: ***  Independent visualization of imaging: - I independently visualized the following imaging with scope of interpretation limited to determining acute life threatening conditions related to emergency care: ***, which revealed ***  Consultation: - Consulted or discussed management/test interpretation w/ external professional: ***  Consideration for admission or further workup: Social Determinants of health:  Final Clinical Impression(s) / ED Diagnoses Final diagnoses:  None    Rx / DC Orders ED Discharge Orders     None

## 2022-01-01 NOTE — Discharge Instructions (Addendum)
You were seen in the emergency today for abdominal pain. Your labs were normal. The CT of your abdomen was normal without any signs of diverticulitis or bowel obstruction. Please follow-up with your primary care provider as needed and return to the emergency department if you have worsening abdominal pain with vomiting, bloody diarrhea or fevers.

## 2022-01-04 ENCOUNTER — Ambulatory Visit: Payer: Medicare Other

## 2022-01-07 ENCOUNTER — Ambulatory Visit
Admission: RE | Admit: 2022-01-07 | Discharge: 2022-01-07 | Disposition: A | Discharge status: Home / Self Care | Payer: Medicare Other | Source: Ambulatory Visit | Attending: Family Medicine | Admitting: Family Medicine

## 2022-01-07 DIAGNOSIS — Z1231 Encounter for screening mammogram for malignant neoplasm of breast: Secondary | ICD-10-CM

## 2022-01-12 ENCOUNTER — Ambulatory Visit (INDEPENDENT_AMBULATORY_CARE_PROVIDER_SITE_OTHER): Payer: Medicare Other | Admitting: Gastroenterology

## 2022-01-12 ENCOUNTER — Encounter (INDEPENDENT_AMBULATORY_CARE_PROVIDER_SITE_OTHER): Payer: Self-pay | Admitting: Gastroenterology

## 2022-01-12 VITALS — BP 116/70 | HR 58 | Temp 97.6°F | Ht 62.0 in | Wt 109.2 lb

## 2022-01-12 DIAGNOSIS — K8020 Calculus of gallbladder without cholecystitis without obstruction: Secondary | ICD-10-CM | POA: Insufficient documentation

## 2022-01-12 DIAGNOSIS — K5900 Constipation, unspecified: Secondary | ICD-10-CM

## 2022-01-12 NOTE — Progress Notes (Signed)
Referring Provider: Sharilyn Sites, MD Primary Care Physician:  Sharilyn Sites, MD Primary GI Physician: Previously Rehman  Chief Complaint  Patient presents with   abnormal results    Patient went to ED on 01/01/22 for abdominal pain and appointment made to discuss results of CT scan and labs. Concerned because she was told she told she has gallstones.    HPI:   Yvette Branch is a 76 y.o. female with past medical history of anxiety, arthritis, bowel obstruction, GERD, malnutrition, osteoporosis, vaginal and rectal prolapse.   Patient presenting today for hospital follow up.  Seen in [facility] ED on 01/01/22 for abdominal pain that began the day prior. Patient reprotedly felt she was not emptying her bowels well despite her stool softener and laxative. She ended up increasing her bowel regimen dose and abdominal pain improved.   CT A/P with contrast 01/01/22: cholelithiasis, mild hepatic steatosis and moderate sigmoid diverticulitis without superimposed acute inflammatory changes.   Labs: UA neg, lipase 38, CBC and CMP unremarkable  Present: Patient states that prior to ED visit she started having severe abdominal pain, she increased her miralax and pain continued through the night. She states she did not feel she was emptying her bowels well. She was concerned given her history of diverticulitis and SBO, which prompted her to proceed to the ED. She had no rectal bleeding, melena, nausea or vomiting. She reports that she eventually was able to move her bowels well and pain improved. She notes that she had a great BM on Sunday after her ED visit and abdominal pain resolved. She continues to take miralax 1-2 capfuls per day, 2 stool softeners. She is drinking a good amount of water as well and does get a good amount of physical activity. No red flag symptoms. Patient denies melena, hematochezia, nausea, vomiting, diarrhea, dysphagia, odyonophagia, early satiety or weight loss.   She is concerned about  the CT results from the ED as no one discussed these with her, her PCP's nurse told her she had gallstones and she is worried about this.   Last Colonoscopy:08/02/14 incomplete due to tortuous, fixed colon, diverticulitis  Virtual colonoscopy: 08/2014 Moderately severe rectosigmoid colon and distal descending colon diverticulosis with multiple diverticula. This results in poor distension of these areas and compromised assessment. 2. The only questionable polypoid lesion identified is a 5 mm polypoid structure at 13 cm from the anal verge. No clinically significant polypoid lesion or constricting lesion is seen. Last Endoscopy:n/a  Recommendations:    Past Medical History:  Diagnosis Date   Abdominal abscess    Anxiety    Arthritis    Bowel obstruction (HCC)    Multiple   Complication of anesthesia    trouble waking up with colonoscopy2007   Degenerative arthritis of lumbar spine    GERD (gastroesophageal reflux disease)    Hot flashes, menopausal    Intra-abdominal abscess post-procedure    Malnutrition ([place])    Osteoporosis    Rectal prolapse    Vaginal prolapse     Past Surgical History:  Procedure Laterality Date   ABDOMINAL HYSTERECTOMY     ABDOMINAL SURGERY     bowel obstruction     COLONOSCOPY N/A 08/02/2014   Procedure: COLONOSCOPY;  Surgeon: Rogene Houston, MD;  Location: [facility];  Service: Endoscopy;  Laterality: N/A;  1240   TONSILLECTOMY      Current Outpatient Medications  Medication Sig Dispense Refill   ALPRAZolam (XANAX) 0.25 MG tablet Take 0.25 mg by  mouth 4 (four) times daily as needed. For anxiety     aspirin EC 81 MG tablet Take 81 mg by mouth daily.     Black Cohosh 540 MG CAPS Take 540 mg by mouth daily.      Coenzyme Q10 (CO Q 10 PO) Take 1 tablet by mouth daily.      docusate sodium (COLACE) 100 MG capsule Take 1 capsule (100 mg total) by mouth 2 (two) times daily. 10 capsule 0   glucosamine-chondroitin 500-400 MG tablet Take 2 tablets by  mouth daily.     levothyroxine (SYNTHROID, LEVOTHROID) 50 MCG tablet Take 50 mcg by mouth daily before breakfast.   4   Multiple Vitamins-Minerals (HAIR/SKIN/NAILS/BIOTIN) TABS Take by mouth daily.     Multiple Vitamins-Minerals (OCUVITE ADULT 50+) CAPS Take by mouth daily.     Omega-3 Fatty Acids (FISH OIL) 1200 MG CAPS Take 1 capsule by mouth daily.     polyethylene glycol (MIRALAX / GLYCOLAX) 17 g packet Take 17 g by mouth at bedtime. For constipation 30 each 11   Probiotic Product (ALIGN) 4 MG CAPS Take 1 capsule by mouth daily. 30 capsule 0   pyridOXINE (VITAMIN B-6) 100 MG tablet Take 100 mg by mouth daily.     UNABLE TO FIND Take 1 tablet by mouth 4 (four) times daily. Med Name: OSTEO-SHEATH (ENHANCED BONE SUPPORT-CALCIUM SUPPLEMENT) Includes: Calcium '1221mg'$  and Vitamin D3 1200IU     vitamin A 8000 UNIT capsule Take 8,000 Units by mouth daily.     vitamin C (ASCORBIC ACID) 500 MG tablet Take 500 mg by mouth daily.     vitamin E 400 UNIT capsule Take 400 Units by mouth daily.     vitamin k 100 MCG tablet Take 100 mcg by mouth daily.      No current facility-administered medications for this visit.    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   Drug use: No    Sexual activity: Yes    Birth control/protection: Surgical  Other Topics Concern   Not on file  Social History Narrative   Single.  Unmarried, no children.  Lives alone. Independent of ADLs, ambulation.   Social Determinants of Health   Financial Resource Strain: Not on file  Food Insecurity: Not on file  Transportation Needs: Not on file  Physical Activity: Not on file  Stress: Not on file  Social Connections: Not on file   Review of systems General: negative for malaise, night sweats, fever, chills, weight loss Neck: Negative for lumps, goiter, pain and significant neck swelling Resp: Negative for cough, wheezing,  dyspnea at rest CV: Negative for chest pain, leg swelling, palpitations, orthopnea GI: denies melena, hematochezia, nausea, vomiting, diarrhea, constipation, dysphagia, odyonophagia, early satiety or unintentional weight loss.  MSK: Negative for joint pain or swelling, back pain, and muscle pain. Derm: Negative for itching or rash Psych: Denies depression, anxiety, memory loss, confusion. No homicidal or suicidal ideation.  Heme: Negative for prolonged bleeding, bruising easily, and swollen nodes. Endocrine: Negative for cold or heat intolerance, polyuria, polydipsia and goiter. Neuro: negative for tremor, gait imbalance, syncope and seizures. The remainder of the review of systems is noncontributory.  Physical Exam: BP 116/70 (BP Location: Left Arm, Patient Position: Sitting, Cuff Size: Normal)   Pulse (!) 58   Temp 97.6 F (36.4 C) (Oral)   Ht '5\' 2"'$  (1.575 m)   Wt 109 lb 3.2 oz (49.5 kg)   BMI 19.97 kg/m  General:   Alert and oriented. No distress noted. Pleasant and cooperative.  Head:  Normocephalic and atraumatic. Eyes:  Conjuctiva clear without scleral icterus. Mouth:  Oral mucosa pink and moist. Good dentition. No lesions. Heart: Normal rate and rhythm, s1 and s2 heart sounds present.  Lungs: Clear lung sounds in all lobes. Respirations equal and  unlabored. Abdomen:  +BS, soft, non-tender and non-distended. No rebound or guarding. No HSM or masses noted. Derm: No palmar erythema or jaundice Msk:  Symmetrical without gross deformities. Normal posture. Extremities:  Without edema. Neurologic:  Alert and  oriented x4 Psych:  Alert and cooperative. Normal mood and affect.  Invalid input(s): "6 MONTHS"   ASSESSMENT: Yvette Branch is a 77 y.o. female presenting today for hospital follow up of abdominal pain.  Recent ED visit for abdominal pain with essentially normal CT abdomen/pelvis imaging and labs, her pain improved after moving her bowels efficiently. Suspect pain was secondary to mild constipation. She is doing well on her current bowel regimen, adding another capful of Miralax as needed. She should continue this as well as good water intake and high fiber diet.   We discussed findings of CT from the ED (gallstones). Patient has no UGI symptoms to indicate these are causing any issues for her. I made patient aware that these are a common, benign finding and no further intervention is needed unless she starts to have pain from them. Notably her LFTs in the ED were all WNL.    PLAN:  Continue current bowel regimen  2. High fiber diet and continued ample water intake   All questions were answered, patient verbalized understanding and is in agreement with plan as outlined above.   Follow Up: 1 year  Vaughan Garfinkle L. Alver Sorrow, MSN, APRN, AGNP-C Adult-Gerontology Nurse Practitioner [facility]

## 2022-01-12 NOTE — Patient Instructions (Signed)
As we discussed, CT in the ER showed some gallstones, however, you are having no symptoms from these, therefore no additional interventions are needed, these are a benign finding Please continue with current bowel regimen, plenty of water, physical activity and high fiber diet  We will plan to see you again in 1 year

## 2022-01-21 ENCOUNTER — Ambulatory Visit (INDEPENDENT_AMBULATORY_CARE_PROVIDER_SITE_OTHER): Payer: Medicare Other | Admitting: Gastroenterology

## 2022-02-01 ENCOUNTER — Ambulatory Visit: Payer: Medicare Other

## 2022-03-01 ENCOUNTER — Ambulatory Visit (INDEPENDENT_AMBULATORY_CARE_PROVIDER_SITE_OTHER): Payer: Medicare Other | Admitting: Gastroenterology

## 2022-03-15 ENCOUNTER — Ambulatory Visit (INDEPENDENT_AMBULATORY_CARE_PROVIDER_SITE_OTHER): Payer: Medicare Other | Admitting: Gastroenterology

## 2022-05-09 ENCOUNTER — Inpatient Hospital Stay (HOSPITAL_COMMUNITY)
Admission: EM | Admit: 2022-05-09 | Discharge: 2022-05-17 | DRG: 329 | Disposition: A | Discharge status: Home Health | Payer: Medicare Other | Attending: General Surgery | Admitting: General Surgery

## 2022-05-09 ENCOUNTER — Inpatient Hospital Stay (HOSPITAL_COMMUNITY): Payer: Medicare Other

## 2022-05-09 ENCOUNTER — Emergency Department (HOSPITAL_COMMUNITY): Payer: Medicare Other

## 2022-05-09 ENCOUNTER — Encounter (HOSPITAL_COMMUNITY)
Admission: EM | Disposition: A | Discharge status: Home Health | Payer: Self-pay | Source: Home / Self Care | Attending: General Surgery

## 2022-05-09 ENCOUNTER — Emergency Department (HOSPITAL_COMMUNITY): Payer: Medicare Other | Admitting: Anesthesiology

## 2022-05-09 ENCOUNTER — Other Ambulatory Visit: Payer: Self-pay

## 2022-05-09 ENCOUNTER — Encounter (HOSPITAL_COMMUNITY): Payer: Self-pay

## 2022-05-09 DIAGNOSIS — R198 Other specified symptoms and signs involving the digestive system and abdomen: Secondary | ICD-10-CM | POA: Diagnosis not present

## 2022-05-09 DIAGNOSIS — M810 Age-related osteoporosis without current pathological fracture: Secondary | ICD-10-CM | POA: Diagnosis present

## 2022-05-09 DIAGNOSIS — J95821 Acute postprocedural respiratory failure: Secondary | ICD-10-CM | POA: Diagnosis not present

## 2022-05-09 DIAGNOSIS — R109 Unspecified abdominal pain: Secondary | ICD-10-CM | POA: Diagnosis present

## 2022-05-09 DIAGNOSIS — Z7982 Long term (current) use of aspirin: Secondary | ICD-10-CM | POA: Diagnosis not present

## 2022-05-09 DIAGNOSIS — K631 Perforation of intestine (nontraumatic): Secondary | ICD-10-CM | POA: Diagnosis not present

## 2022-05-09 DIAGNOSIS — Z881 Allergy status to other antibiotic agents status: Secondary | ICD-10-CM | POA: Diagnosis not present

## 2022-05-09 DIAGNOSIS — K219 Gastro-esophageal reflux disease without esophagitis: Secondary | ICD-10-CM | POA: Diagnosis not present

## 2022-05-09 DIAGNOSIS — Z806 Family history of leukemia: Secondary | ICD-10-CM

## 2022-05-09 DIAGNOSIS — Z8719 Personal history of other diseases of the digestive system: Secondary | ICD-10-CM

## 2022-05-09 DIAGNOSIS — Z888 Allergy status to other drugs, medicaments and biological substances status: Secondary | ICD-10-CM

## 2022-05-09 DIAGNOSIS — K5720 Diverticulitis of large intestine with perforation and abscess without bleeding: Secondary | ICD-10-CM | POA: Diagnosis not present

## 2022-05-09 DIAGNOSIS — K5909 Other constipation: Secondary | ICD-10-CM | POA: Diagnosis not present

## 2022-05-09 DIAGNOSIS — Z90710 Acquired absence of both cervix and uterus: Secondary | ICD-10-CM

## 2022-05-09 DIAGNOSIS — Z79899 Other long term (current) drug therapy: Secondary | ICD-10-CM | POA: Diagnosis not present

## 2022-05-09 DIAGNOSIS — E039 Hypothyroidism, unspecified: Secondary | ICD-10-CM | POA: Diagnosis present

## 2022-05-09 DIAGNOSIS — J9601 Acute respiratory failure with hypoxia: Secondary | ICD-10-CM | POA: Diagnosis not present

## 2022-05-09 DIAGNOSIS — K529 Noninfective gastroenteritis and colitis, unspecified: Secondary | ICD-10-CM | POA: Diagnosis not present

## 2022-05-09 DIAGNOSIS — K658 Other peritonitis: Secondary | ICD-10-CM | POA: Diagnosis present

## 2022-05-09 HISTORY — PX: COLECTOMY WITH COLOSTOMY CREATION/HARTMANN PROCEDURE: SHX6598

## 2022-05-09 LAB — CBC
HCT: 41.4 % (ref 36.0–46.0)
Hemoglobin: 13 g/dL (ref 12.0–15.0)
MCH: 29.4 pg (ref 26.0–34.0)
MCHC: 31.4 g/dL (ref 30.0–36.0)
MCV: 93.7 fL (ref 80.0–100.0)
Platelets: 214 10*3/uL (ref 150–400)
RBC: 4.42 MIL/uL (ref 3.87–5.11)
RDW: 13.1 % (ref 11.5–15.5)
WBC: 5.4 10*3/uL (ref 4.0–10.5)
nRBC: 0 % (ref 0.0–0.2)

## 2022-05-09 LAB — COMPREHENSIVE METABOLIC PANEL
ALT: 20 U/L (ref 0–44)
AST: 31 U/L (ref 15–41)
Albumin: 4.1 g/dL (ref 3.5–5.0)
Alkaline Phosphatase: 71 U/L (ref 38–126)
Anion gap: 13 (ref 5–15)
BUN: 17 mg/dL (ref 8–23)
CO2: 25 mmol/L (ref 22–32)
Calcium: 9.2 mg/dL (ref 8.9–10.3)
Chloride: 97 mmol/L — ABNORMAL LOW (ref 98–111)
Creatinine, Ser: 0.85 mg/dL (ref 0.44–1.00)
GFR, Estimated: >60 mL/min (ref >60)
Glucose, Bld: 139 mg/dL — ABNORMAL HIGH (ref 70–99)
Potassium: 4.1 mmol/L (ref 3.5–5.1)
Sodium: 135 mmol/L (ref 135–145)
Total Bilirubin: 0.7 mg/dL (ref 0.3–1.2)
Total Protein: 8.4 g/dL — ABNORMAL HIGH (ref 6.5–8.1)

## 2022-05-09 LAB — URINALYSIS, ROUTINE W REFLEX MICROSCOPIC
Bacteria, UA: NONE SEEN
Bilirubin Urine: NEGATIVE
Glucose, UA: NEGATIVE mg/dL
Hgb urine dipstick: NEGATIVE
Ketones, ur: 20 mg/dL — AB
Leukocytes,Ua: NEGATIVE
Nitrite: NEGATIVE
Protein, ur: 30 mg/dL — AB
Specific Gravity, Urine: 1.018 (ref 1.005–1.030)
pH: 5 (ref 5.0–8.0)

## 2022-05-09 LAB — TYPE AND SCREEN
ABO/RH(D): O NEG
Antibody Screen: NEGATIVE

## 2022-05-09 LAB — APTT: aPTT: 29 seconds (ref 24–36)

## 2022-05-09 LAB — LIPASE, BLOOD: Lipase: 34 U/L (ref 11–51)

## 2022-05-09 LAB — ABO/RH: ABO/RH(D): O NEG

## 2022-05-09 LAB — PROTIME-INR
INR: 1 (ref 0.8–1.2)
Prothrombin Time: 13.3 seconds (ref 11.4–15.2)

## 2022-05-09 SURGERY — COLECTOMY, WITH COLOSTOMY CREATION
Anesthesia: General | Site: Abdomen

## 2022-05-09 MED ORDER — HEPARIN SODIUM (PORCINE) 5000 UNIT/ML IJ SOLN
5000.0000 [IU] | Freq: Three times a day (TID) | INTRAMUSCULAR | Status: DC
  Administered 2022-05-10 – 2022-05-15 (×15): 5000 [IU] via SUBCUTANEOUS
  Filled 2022-05-09 (×15): qty 1

## 2022-05-09 MED ORDER — LACTATED RINGERS IV SOLN: INTRAVENOUS | Status: DC | PRN

## 2022-05-09 MED ORDER — IOHEXOL 300 MG/ML  SOLN
100.0000 mL | Freq: Once | INTRAMUSCULAR | Status: AC | PRN
  Administered 2022-05-09: 100 mL via INTRAVENOUS

## 2022-05-09 MED ORDER — MORPHINE SULFATE (PF) 2 MG/ML IV SOLN
2.0000 mg | Freq: Once | INTRAVENOUS | Status: AC
  Administered 2022-05-09: 2 mg via INTRAVENOUS
  Filled 2022-05-09: qty 1

## 2022-05-09 MED ORDER — DIPHENHYDRAMINE HCL 50 MG/ML IJ SOLN: 12.5000 mg | Freq: Four times a day (QID) | INTRAMUSCULAR | Status: DC | PRN

## 2022-05-09 MED ORDER — PROPOFOL 10 MG/ML IV BOLUS
INTRAVENOUS | Status: AC
  Filled 2022-05-09: qty 20

## 2022-05-09 MED ORDER — DEXAMETHASONE SODIUM PHOSPHATE 10 MG/ML IJ SOLN
INTRAMUSCULAR | Status: AC
  Filled 2022-05-09: qty 1

## 2022-05-09 MED ORDER — ROCURONIUM BROMIDE 10 MG/ML (PF) SYRINGE
PREFILLED_SYRINGE | INTRAVENOUS | Status: DC | PRN
  Administered 2022-05-09: 50 mg via INTRAVENOUS
  Administered 2022-05-09: 20 mg via INTRAVENOUS

## 2022-05-09 MED ORDER — PHENYLEPHRINE HCL-NACL 20-0.9 MG/250ML-% IV SOLN
25.0000 ug/min | INTRAVENOUS | Status: DC
  Administered 2022-05-10 (×2): 25 ug/min via INTRAVENOUS

## 2022-05-09 MED ORDER — ONDANSETRON HCL 4 MG/2ML IJ SOLN
INTRAMUSCULAR | Status: AC
  Filled 2022-05-09: qty 2

## 2022-05-09 MED ORDER — SODIUM CHLORIDE 0.9 % IV SOLN
2.0000 g | INTRAVENOUS | Status: AC
  Administered 2022-05-09: 2 g via INTRAVENOUS

## 2022-05-09 MED ORDER — LIDOCAINE HCL (PF) 2 % IJ SOLN
INTRAMUSCULAR | Status: AC
  Filled 2022-05-09: qty 5

## 2022-05-09 MED ORDER — SODIUM CHLORIDE 0.9 % IV SOLN
INTRAVENOUS | Status: AC
  Filled 2022-05-09: qty 2

## 2022-05-09 MED ORDER — PHENYLEPHRINE 80 MCG/ML (10ML) SYRINGE FOR IV PUSH (FOR BLOOD PRESSURE SUPPORT)
PREFILLED_SYRINGE | INTRAVENOUS | Status: DC | PRN
  Administered 2022-05-09 (×2): 80 ug via INTRAVENOUS

## 2022-05-09 MED ORDER — METOPROLOL TARTRATE 5 MG/5ML IV SOLN: 5.0000 mg | Freq: Four times a day (QID) | INTRAVENOUS | Status: DC | PRN

## 2022-05-09 MED ORDER — DIPHENHYDRAMINE HCL 12.5 MG/5ML PO ELIX: 12.5000 mg | ORAL_SOLUTION | Freq: Four times a day (QID) | ORAL | Status: DC | PRN

## 2022-05-09 MED ORDER — DEXAMETHASONE SODIUM PHOSPHATE 10 MG/ML IJ SOLN
INTRAMUSCULAR | Status: DC | PRN
  Administered 2022-05-09: 10 mg via INTRAVENOUS

## 2022-05-09 MED ORDER — MIDAZOLAM HCL 2 MG/2ML IJ SOLN
INTRAMUSCULAR | Status: AC
  Filled 2022-05-09: qty 2

## 2022-05-09 MED ORDER — ROCURONIUM BROMIDE 10 MG/ML (PF) SYRINGE
PREFILLED_SYRINGE | INTRAVENOUS | Status: AC
  Filled 2022-05-09: qty 10

## 2022-05-09 MED ORDER — MIDAZOLAM HCL 5 MG/5ML IJ SOLN
INTRAMUSCULAR | Status: DC | PRN
  Administered 2022-05-09 (×2): 1 mg via INTRAVENOUS
  Administered 2022-05-09: 2 mg via INTRAVENOUS

## 2022-05-09 MED ORDER — PHENYLEPHRINE HCL-NACL 20-0.9 MG/250ML-% IV SOLN
INTRAVENOUS | Status: DC | PRN
  Administered 2022-05-09: 15 ug/min via INTRAVENOUS

## 2022-05-09 MED ORDER — ONDANSETRON 4 MG PO TBDP: 4.0000 mg | ORAL_TABLET | Freq: Four times a day (QID) | ORAL | Status: DC | PRN

## 2022-05-09 MED ORDER — PHENYLEPHRINE 80 MCG/ML (10ML) SYRINGE FOR IV PUSH (FOR BLOOD PRESSURE SUPPORT)
PREFILLED_SYRINGE | INTRAVENOUS | Status: AC
  Filled 2022-05-09: qty 10

## 2022-05-09 MED ORDER — FENTANYL CITRATE (PF) 250 MCG/5ML IJ SOLN
INTRAMUSCULAR | Status: AC
  Filled 2022-05-09: qty 5

## 2022-05-09 MED ORDER — PHENYLEPHRINE HCL-NACL 20-0.9 MG/250ML-% IV SOLN
INTRAVENOUS | Status: AC
  Filled 2022-05-09: qty 250

## 2022-05-09 MED ORDER — SODIUM CHLORIDE 0.9 % IR SOLN
Status: DC | PRN
  Administered 2022-05-09 (×4): 1000 mL

## 2022-05-09 MED ORDER — ONDANSETRON HCL 4 MG/2ML IJ SOLN
4.0000 mg | Freq: Once | INTRAMUSCULAR | Status: AC
  Administered 2022-05-09: 4 mg via INTRAVENOUS
  Filled 2022-05-09: qty 2

## 2022-05-09 MED ORDER — ONDANSETRON HCL 4 MG/2ML IJ SOLN: 4.0000 mg | Freq: Four times a day (QID) | INTRAMUSCULAR | Status: DC | PRN

## 2022-05-09 MED ORDER — SODIUM CHLORIDE 0.9 % IV SOLN: 250.0000 mL | INTRAVENOUS | Status: DC

## 2022-05-09 MED ORDER — LACTATED RINGERS IV SOLN: INTRAVENOUS | Status: DC

## 2022-05-09 MED ORDER — BUPIVACAINE LIPOSOME 1.3 % IJ SUSP
INTRAMUSCULAR | Status: AC
  Filled 2022-05-09: qty 20

## 2022-05-09 MED ORDER — PANTOPRAZOLE SODIUM 40 MG IV SOLR
40.0000 mg | Freq: Every day | INTRAVENOUS | Status: DC
  Administered 2022-05-09 – 2022-05-16 (×8): 40 mg via INTRAVENOUS
  Filled 2022-05-09 (×10): qty 10

## 2022-05-09 MED ORDER — BUPIVACAINE LIPOSOME 1.3 % IJ SUSP
INTRAMUSCULAR | Status: DC | PRN
  Administered 2022-05-09: 20 mL

## 2022-05-09 MED ORDER — FENTANYL CITRATE (PF) 100 MCG/2ML IJ SOLN
INTRAMUSCULAR | Status: DC | PRN
  Administered 2022-05-09 (×5): 50 ug via INTRAVENOUS

## 2022-05-09 MED ORDER — SODIUM CHLORIDE 0.9 % IV SOLN
2.0000 g | Freq: Two times a day (BID) | INTRAVENOUS | Status: DC
  Administered 2022-05-10 – 2022-05-11 (×3): 2 g via INTRAVENOUS
  Filled 2022-05-09 (×5): qty 2

## 2022-05-09 MED ORDER — PIPERACILLIN-TAZOBACTAM 3.375 G IVPB 30 MIN
3.3750 g | Freq: Once | INTRAVENOUS | Status: AC
  Administered 2022-05-09: 3.375 g via INTRAVENOUS
  Filled 2022-05-09: qty 50

## 2022-05-09 MED ORDER — PROPOFOL 10 MG/ML IV BOLUS
INTRAVENOUS | Status: DC | PRN
  Administered 2022-05-09: 70 mg via INTRAVENOUS

## 2022-05-09 MED ORDER — MORPHINE SULFATE (PF) 4 MG/ML IV SOLN
2.0000 mg | Freq: Once | INTRAVENOUS | Status: AC
  Administered 2022-05-09: 2 mg via INTRAVENOUS
  Filled 2022-05-09: qty 1

## 2022-05-09 MED ORDER — DEXMEDETOMIDINE HCL IN NACL 200 MCG/50ML IV SOLN
0.0000 ug/kg/h | INTRAVENOUS | Status: DC
  Administered 2022-05-09: 0.4 ug/kg/h via INTRAVENOUS
  Filled 2022-05-09: qty 50

## 2022-05-09 MED ORDER — SUCCINYLCHOLINE CHLORIDE 200 MG/10ML IV SOSY
PREFILLED_SYRINGE | INTRAVENOUS | Status: AC
  Filled 2022-05-09: qty 10

## 2022-05-09 MED ORDER — SODIUM CHLORIDE 0.9 % IV BOLUS
500.0000 mL | Freq: Once | INTRAVENOUS | Status: AC
  Administered 2022-05-09: 500 mL via INTRAVENOUS

## 2022-05-09 MED ORDER — SUCCINYLCHOLINE CHLORIDE 200 MG/10ML IV SOSY
PREFILLED_SYRINGE | INTRAVENOUS | Status: DC | PRN
  Administered 2022-05-09: 100 mg via INTRAVENOUS

## 2022-05-09 MED ORDER — MORPHINE SULFATE (PF) 2 MG/ML IV SOLN
2.0000 mg | INTRAVENOUS | Status: DC | PRN
  Administered 2022-05-10 – 2022-05-11 (×2): 2 mg via INTRAVENOUS
  Filled 2022-05-09 (×3): qty 1

## 2022-05-09 MED ORDER — MIDAZOLAM HCL 2 MG/2ML IJ SOLN: 0.5000 mg | INTRAMUSCULAR | Status: DC | PRN

## 2022-05-09 SURGICAL SUPPLY — 47 items
BARRIER SKIN 2 3/4 (OSTOMY) ×1 IMPLANT
BARRIER SKIN OD2.25 2 3/4 FLNG (OSTOMY) IMPLANT
CHLORAPREP W/TINT 26 (MISCELLANEOUS) ×1 IMPLANT
CLAMP POUCH DRAINAGE QUIET (OSTOMY) IMPLANT
CLOTH BEACON ORANGE TIMEOUT ST (SAFETY) ×1 IMPLANT
COVER LIGHT HANDLE STERIS (MISCELLANEOUS) ×2 IMPLANT
DRAPE WARM FLUID 44X44 (DRAPES) ×1 IMPLANT
DRSG OPSITE POSTOP 4X10 (GAUZE/BANDAGES/DRESSINGS) IMPLANT
ELECT BLADE 6 FLAT ULTRCLN (ELECTRODE) IMPLANT
ELECT REM PT RETURN 9FT ADLT (ELECTROSURGICAL) ×1
ELECTRODE REM PT RTRN 9FT ADLT (ELECTROSURGICAL) ×1 IMPLANT
GAUZE SPONGE 4X4 12PLY STRL (GAUZE/BANDAGES/DRESSINGS) ×1 IMPLANT
GLOVE BIO SURGEON STRL SZ 6.5 (GLOVE) ×1 IMPLANT
GLOVE BIOGEL PI IND STRL 6.5 (GLOVE) ×1 IMPLANT
GLOVE BIOGEL PI IND STRL 7.0 (GLOVE) ×2 IMPLANT
GLOVE ECLIPSE 6.5 STRL STRAW (GLOVE) IMPLANT
GOWN STRL REUS W/TWL LRG LVL3 (GOWN DISPOSABLE) ×3 IMPLANT
HANDLE SUCTION POOLE (INSTRUMENTS) ×1 IMPLANT
INST SET MAJOR GENERAL (KITS) ×1 IMPLANT
KIT TURNOVER KIT A (KITS) ×1 IMPLANT
LIGASURE IMPACT 36 18CM CVD LR (INSTRUMENTS) ×1 IMPLANT
MANIFOLD NEPTUNE II (INSTRUMENTS) ×1 IMPLANT
NDL HYPO 21X1.5 SAFETY (NEEDLE) ×1 IMPLANT
NEEDLE HYPO 21X1.5 SAFETY (NEEDLE) ×1 IMPLANT
NS IRRIG 1000ML POUR BTL (IV SOLUTION) ×2 IMPLANT
PACK ABDOMINAL MAJOR (CUSTOM PROCEDURE TRAY) ×1 IMPLANT
PAD ARMBOARD 7.5X6 YLW CONV (MISCELLANEOUS) ×1 IMPLANT
RETRACTOR WND ALEXIS-O 25 LRG (MISCELLANEOUS) IMPLANT
RTRCTR WOUND ALEXIS O 25CM LRG (MISCELLANEOUS) ×1
SET BASIN LINEN APH (SET/KITS/TRAYS/PACK) ×1 IMPLANT
SPONGE T-LAP 18X18 ~~LOC~~+RFID (SPONGE) ×1 IMPLANT
STAPLER CVD CUT GN 40 RELOAD (ENDOMECHANICALS) ×1 IMPLANT
STAPLER CVD CUT GRN 40 RELOAD (ENDOMECHANICALS) IMPLANT
STAPLER PROXIMATE 75MM BLUE (STAPLE) IMPLANT
STAPLER VISISTAT (STAPLE) ×1 IMPLANT
SUCTION POOLE HANDLE (INSTRUMENTS) ×1
SUT CHROMIC 0 SH (SUTURE) ×1 IMPLANT
SUT PDS AB CT VIOLET #0 27IN (SUTURE) IMPLANT
SUT PROLENE NAB BLUE 3-0 30IN (SUTURE) IMPLANT
SUT SILK 2 0 (SUTURE)
SUT SILK 2-0 18XBRD TIE 12 (SUTURE) IMPLANT
SUT SILK 3 0 SH CR/8 (SUTURE) IMPLANT
SUT VIC AB 3-0 SH 27 (SUTURE)
SUT VIC AB 3-0 SH 27X BRD (SUTURE) IMPLANT
SWAB CULTURE ESWAB REG 1ML (MISCELLANEOUS) IMPLANT
SYR 20ML LL LF (SYRINGE) ×1 IMPLANT
TRAY FOLEY MTR SLVR 16FR STAT (SET/KITS/TRAYS/PACK) ×1 IMPLANT

## 2022-05-09 NOTE — ED Provider Notes (Signed)
[place] Provider Note   CSN: [id number] Arrival date & time: 05/09/22  1546     History  Chief Complaint  Patient presents with   Abdominal Pain    Yvette Branch is a 77 y.o. female.Yvette Branch is a 77 y.o. female.  She has past medical history of small bowel obstruction 2014, chronic constipation who presents to ER, planing of lower abdominal pain bilaterally yesterday worse today and is constant.  She states she does have some urinary hesitance and some mild constipation denies fevers or chills, she has some nausea but no vomiting.  She denies hematemesis or hematochezia       Abdominal Pain      Home Medications Prior to Admission medications   Medication Sig Start Date End Date Taking? Authorizing Provider  ALPRAZolam (XANAX) 0.25 MG tablet Take 0.25 mg by mouth 4 (four) times daily as needed. For anxiety    [provider]  aspirin EC 81 MG tablet Take 81 mg by mouth daily.    [provider]  Black Cohosh 540 MG CAPS Take 540 mg by mouth daily.     [provider]  Coenzyme Q10 (CO Q 10 PO) Take 1 tablet by mouth daily.     [provider]  docusate sodium (COLACE) 100 MG capsule Take 1 capsule (100 mg total) by mouth 2 (two) times daily. 08/02/14   Rogene Houston, MD  glucosamine-chondroitin 500-400 MG tablet Take 2 tablets by mouth daily.    [provider]  levothyroxine (SYNTHROID, LEVOTHROID) 50 MCG tablet Take 50 mcg by mouth daily before breakfast.  02/17/15   [provider]  Multiple Vitamins-Minerals (HAIR/SKIN/NAILS/BIOTIN) TABS Take by mouth daily.    [provider]  Multiple Vitamins-Minerals (OCUVITE ADULT 50+) CAPS Take by mouth daily.    [provider]  Omega-3 Fatty Acids (FISH OIL) 1200 MG CAPS Take 1 capsule by mouth daily.    [provider]  polyethylene glycol (MIRALAX / GLYCOLAX) 17 g packet Take 17 g by mouth at  bedtime. For constipation 11/28/18   Rogene Houston, MD  Probiotic Product (ALIGN) 4 MG CAPS Take 1 capsule by mouth daily. 01/18/12   Kathie Dike, MD  pyridOXINE (VITAMIN B-6) 100 MG tablet Take 100 mg by mouth daily.    [provider]  UNABLE TO FIND Take 1 tablet by mouth 4 (four) times daily. Med Name: OSTEO-SHEATH (ENHANCED BONE SUPPORT-CALCIUM SUPPLEMENT) Includes: Calcium '1221mg'$  and Vitamin D3 1200IU    [provider]  vitamin A 8000 UNIT capsule Take 8,000 Units by mouth daily.    [provider]  vitamin C (ASCORBIC ACID) 500 MG tablet Take 500 mg by mouth daily.    [provider]  vitamin E 400 UNIT capsule Take 400 Units by mouth daily.    [provider]  vitamin k 100 MCG tablet Take 100 mcg by mouth daily.     [provider]      Allergies    Doxycycline and Metronidazole    Review of Systems   Review of Systems  Gastrointestinal:  Positive for abdominal pain.    Physical Exam Updated Vital Signs BP 118/60   Pulse 92   Temp 99 F (37.2 C) (Oral)   Resp 17   Ht '5\' 2"'$  (1.575 m)   Wt 48.5 kg   SpO2 98%   BMI 19.57 kg/m  Physical Exam Vitals and nursing note  reviewed.  Constitutional:      General: She is not in acute distress.    Appearance: She is well-developed.  HENT:     Head: Normocephalic and atraumatic.  Eyes:     Conjunctiva/sclera: Conjunctivae normal.  Cardiovascular:     Rate and Rhythm: Normal rate and regular rhythm.     Heart sounds: No murmur heard. Pulmonary:     Effort: Pulmonary effort is normal. No respiratory distress.     Breath sounds: Normal breath sounds.  Abdominal:     Palpations: Abdomen is soft.     Tenderness: There is abdominal tenderness in the right lower quadrant, suprapubic area and left lower quadrant. There is no right CVA tenderness or left CVA tenderness.  Musculoskeletal:        General: No swelling.     Cervical back: Neck supple.  Skin:    General: Skin  is warm and dry.     Capillary Refill: Capillary refill takes less than 2 seconds.  Neurological:     Mental Status: She is alert.  Psychiatric:        Mood and Affect: Mood normal.     ED Results / Procedures / Treatments   Labs (all labs ordered are listed, but only abnormal results are displayed) Labs Reviewed  COMPREHENSIVE METABOLIC PANEL - Abnormal; Notable for the following components:      Result Value   Chloride 97 (*)    Glucose, Bld 139 (*)    Total Protein 8.4 (*)    All other components within normal limits  URINALYSIS, ROUTINE W REFLEX MICROSCOPIC - Abnormal; Notable for the following components:   APPearance HAZY (*)    Ketones, ur 20 (*)    Protein, ur 30 (*)    All other components within normal limits  LIPASE, BLOOD  CBC  PROTIME-INR  APTT  TYPE AND SCREEN    EKG None  Radiology CT ABDOMEN PELVIS W CONTRAST  Result Date: 05/09/2022 CLINICAL DATA:  Acute abdominal pain beginning 3 days ago. Diarrhea and chills. EXAM: CT ABDOMEN AND PELVIS WITH CONTRAST TECHNIQUE: Multidetector CT imaging of the abdomen and pelvis was performed using the standard protocol following bolus administration of intravenous contrast. RADIATION DOSE REDUCTION: This exam was performed according to the departmental dose-optimization program which includes automated exposure control, adjustment of the mA and/or kV according to patient size and/or use of iterative reconstruction technique. CONTRAST:  127m OMNIPAQUE IOHEXOL 300 MG/ML  SOLN COMPARISON:  CT of the abdomen and pelvis 01/01/2022 FINDINGS: Lower chest: Mild dependent atelectasis is present. Lungs are otherwise clear. Heart size is normal. No significant pleural or pericardial effusion is present. Hepatobiliary: Liver is unremarkable. Layering gallstones are present without focal inflammatory changes about the gallbladder. The common bile duct is within normal limits. Pancreas: Unremarkable. No pancreatic ductal dilatation or  surrounding inflammatory changes. Spleen: Normal in size without focal abnormality. Adrenals/Urinary Tract: Adrenal glands are unremarkable. Kidneys are normal, without renal calculi, focal lesion, or hydronephrosis. Bladder is unremarkable. Stomach/Bowel: The stomach and duodenum are within normal limits. Multiple dilated inflamed loops of bowel are present within the anatomic pelvis. No proximal obstruction is present. Diffuse inflammatory changes are present throughout the colon. Pneumoperitoneum is present. Locules of gas are noted anteriorly along the peritoneum and into the porta hepatis. A developing fluid collection is present within the anatomic pelvis adjacent to the inflamed colon and irregular calcification is present adjacent to the collection. This may be a nidus for the infection. Additional free  fluid is seen dependently within the anatomic pelvis. Vascular/Lymphatic: No significant vascular findings are present. No enlarged abdominal or pelvic lymph nodes. Reproductive: Status post hysterectomy. No adnexal masses. Musculoskeletal: Vertebral body heights and alignment are normal. No acute or focal osseous abnormalities are present. Hips are located and within normal limits bilaterally. IMPRESSION: 1. Hollow viscus perforation likely secondary to diffuse inflammatory colitis. 2. Developing fluid collection with gas in the central pelvis. 3. Adjacent 2 cm calcification which may be related to the nidus of infection/inflammation. 4. Multiple inflamed loops of small bowel without obstruction. This is likely secondary to the primary infection and extraluminal collection. 5. Additional free fluid within the anatomic pelvis. These results were called by telephone at the time of interpretation on 05/09/2022 at 6:38 pm to provider Dr. Rogene Houston , who verbally acknowledged these results. Electronically Signed   By: San Morelle M.D.   On: 05/09/2022 18:46    Procedures Procedures    Medications  Ordered in ED Medications  piperacillin-tazobactam (ZOSYN) IVPB 3.375 g (3.375 g Intravenous New Bag/Given 05/09/22 1919)  sodium chloride 0.9 % bolus 500 mL (0 mLs Intravenous Stopped 05/09/22 1911)  morphine (PF) 4 MG/ML injection 2 mg (2 mg Intravenous Given 05/09/22 1717)  ondansetron (ZOFRAN) injection 4 mg (4 mg Intravenous Given 05/09/22 1717)  iohexol (OMNIPAQUE) 300 MG/ML solution 100 mL (100 mLs Intravenous Contrast Given 05/09/22 1804)    ED Course/ Medical Decision Making/ A&P                             Medical Decision Making This patient presents to the ED for concern of abdominal pain, this involves an extensive number of treatment options, and is a complaint that carries with it a high risk of complications and morbidity.  The differential diagnosis includes SBO, colitis, diverticulitis, other   Co morbidities that complicate the patient evaluation  History of SBO and diverticulitis   Additional history obtained:  Additional history obtained from EMR External records from outside source obtained and reviewed including prior ED visit and GI notes   Lab Tests:  I Ordered, and personally interpreted labs.  The pertinent results include:  Reassuring CBC, CMP    Imaging Studies ordered:  I ordered imaging studies including CT abd pelvis  I independently visualized and interpreted imaging which showed diffuse colitis with hollow viscous perforation I agree with the radiologist interpretation   Cardiac Monitoring: / EKG:  The patient was maintained on a cardiac monitor.  I personally viewed and interpreted the cardiac monitored which showed an underlying rhythm of: sinus rhythm   Consultations Obtained:  I requested consultation with the Surgeon Dr. Constance Haw,  and discussed lab and imaging findings as well as pertinent plan - they recommend: plan for going to OR   Problem List / ED Course / Critical interventions / Medication management  Colitis with  perforation I ordered medication including morphine  for pain, zosyn for infection  Reevaluation of the patient after these medicines showed that the patient improved I have reviewed the patients home medicines and have made adjustments as needed          Amount and/or Complexity of Data Reviewed Labs: ordered. Radiology: ordered.  Risk Prescription drug management.           Final Clinical Impression(s) / ED Diagnoses Final diagnoses:  Perforated abdominal viscus  Colitis    Rx / DC Orders ED Discharge Orders  None         Darci Current 05/09/22 1925    Fredia Sorrow, MD 05/09/22 2317

## 2022-05-09 NOTE — H&P (Signed)
[facility] History and Physical  Reason for Referral: Colon perforation Referring Physician: ED  Chief Complaint   Abdominal Pain     Yvette Branch is a 77 y.o. female.  HPI: Ms. Hegwood is a 77 yo who has GERD, some chronic constipation, prior bowel obstruction with abscess who comes in with worsening abdominal pain over the past 3 days and some nausea and no vomiting. She has had some blood in her stools with wiping.  She has had some issues with urine hesitancy.     Past Medical History:  Diagnosis Date   Abdominal abscess    Anxiety    Arthritis    Bowel obstruction (HCC)    Multiple   Complication of anesthesia    trouble waking up with colonoscopy2007   Degenerative arthritis of lumbar spine    GERD (gastroesophageal reflux disease)    Hot flashes, menopausal    Intra-abdominal abscess post-procedure    Malnutrition ([place])    Osteoporosis    Rectal prolapse    Vaginal prolapse     Past Surgical History:  Procedure Laterality Date   ABDOMINAL HYSTERECTOMY     ABDOMINAL SURGERY     bowel obstruction     COLONOSCOPY N/A 08/02/2014   Procedure: COLONOSCOPY;  Surgeon: Rogene Houston, MD;  Location: [facility];  Service: Endoscopy;  Laterality: N/A;  49   TONSILLECTOMY      Family History  Problem Relation Age of Onset   Cancer Mother        Throat   Cancer Sister        Leukemia    Social History   Tobacco Use   Smoking status: Never    Passive exposure: Past   Smokeless tobacco: Never  Vaping Use   Vaping Use: Never used  Substance Use Topics   Alcohol use: Yes    Alcohol/week: 0.0 standard drinks of alcohol    Comment: Occasional wine.   Drug use: No    Medications: I have reviewed the patient's current medications. No current facility-administered medications on file prior to encounter.   Current Outpatient Medications on File Prior to Encounter  Medication Sig Dispense Refill   ALPRAZolam (XANAX) 0.25 MG tablet Take 0.25  mg by mouth 4 (four) times daily as needed. For anxiety     aspirin EC 81 MG tablet Take 81 mg by mouth daily.     Black Cohosh 540 MG CAPS Take 540 mg by mouth daily.      Coenzyme Q10 (CO Q 10 PO) Take 1 tablet by mouth daily.      docusate sodium (COLACE) 100 MG capsule Take 1 capsule (100 mg total) by mouth 2 (two) times daily. 10 capsule 0   glucosamine-chondroitin 500-400 MG tablet Take 2 tablets by mouth daily.     levothyroxine (SYNTHROID, LEVOTHROID) 50 MCG tablet Take 50 mcg by mouth daily before breakfast.   4   Multiple Vitamins-Minerals (HAIR/SKIN/NAILS/BIOTIN) TABS Take by mouth daily.     Multiple Vitamins-Minerals (OCUVITE ADULT 50+) CAPS Take by mouth daily.     Omega-3 Fatty Acids (FISH OIL) 1200 MG CAPS Take 1 capsule by mouth daily.     polyethylene glycol (MIRALAX / GLYCOLAX) 17 g packet Take 17 g by mouth at bedtime. For constipation 30 each 11   Probiotic Product (ALIGN) 4 MG CAPS Take 1 capsule by mouth daily. 30 capsule 0   pyridOXINE (VITAMIN B-6) 100 MG tablet Take 100 mg by mouth daily.  UNABLE TO FIND Take 1 tablet by mouth 4 (four) times daily. Med Name: OSTEO-SHEATH (ENHANCED BONE SUPPORT-CALCIUM SUPPLEMENT) Includes: Calcium '1221mg'$  and Vitamin D3 1200IU     vitamin A 8000 UNIT capsule Take 8,000 Units by mouth daily.     vitamin C (ASCORBIC ACID) 500 MG tablet Take 500 mg by mouth daily.     vitamin E 400 UNIT capsule Take 400 Units by mouth daily.     vitamin k 100 MCG tablet Take 100 mcg by mouth daily.       Allergies  Allergen Reactions   Doxycycline Other (See Comments)    Bad vision   Metronidazole Hives, Diarrhea and Rash    Numbness in face     ROS:  A comprehensive review of systems was negative except for: Gastrointestinal: positive for abdominal pain  Blood pressure (!) 126/58, pulse 97, temperature 99 F (37.2 C), temperature source Oral, resp. rate 17, height '5\' 2"'$  (1.575 m), weight 48.5 kg, SpO2 98 %. Physical Exam Vitals reviewed.   HENT:     Head: Normocephalic.  Cardiovascular:     Rate and Rhythm: Normal rate and regular rhythm.  Pulmonary:     Effort: Pulmonary effort is normal.  Abdominal:     General: There is no distension.     Palpations: Abdomen is soft.     Tenderness: There is abdominal tenderness in the suprapubic area. There is guarding.  Musculoskeletal:     Comments: Moves all extremities   Skin:    General: Skin is warm.  Neurological:     General: No focal deficit present.     Mental Status: She is alert and oriented to person, place, and time.  Psychiatric:        Mood and Affect: Mood normal.        Behavior: Behavior normal.     Results: Results for orders placed or performed during the hospital encounter of 05/09/22 (from the past 48 hour(s))  Protime-INR     Status: None   Collection Time: 05/09/22  4:22 PM  Result Value Ref Range   Prothrombin Time 13.3 11.4 - 15.2 seconds   INR 1.0 0.8 - 1.2    Comment: (NOTE) INR goal varies based on device and disease states. Performed at [facility], [address], [place], [place] [address]   APTT     Status: None   Collection Time: 05/09/22  4:22 PM  Result Value Ref Range   aPTT 29 24 - 36 seconds    Comment: Performed at [facility], [address] [place], [place] [address]  Lipase, blood     Status: None   Collection Time: 05/09/22  4:30 PM  Result Value Ref Range   Lipase 34 11 - 51 U/L    Comment: Performed at [facility], [address], [place], [place] [address]  Comprehensive metabolic panel     Status: Abnormal   Collection Time: 05/09/22  4:30 PM  Result Value Ref Range   Sodium 135 135 - 145 mmol/L   Potassium 4.1 3.5 - 5.1 mmol/L   Chloride 97 (L) 98 - 111 mmol/L   CO2 25 22 - 32 mmol/L   Glucose, Bld 139 (H) 70 - 99 mg/dL    Comment: Glucose reference range applies only to samples taken after fasting for at least 8 hours.   BUN 17 8 - 23 mg/dL   Creatinine, Ser 0.85 0.44 - 1.00 mg/dL   Calcium 9.2 8.9 - 10.3  mg/dL  Total Protein 8.4 (H) 6.5 - 8.1 g/dL   Albumin 4.1 3.5 - 5.0 g/dL   AST 31 15 - 41 U/L   ALT 20 0 - 44 U/L   Alkaline Phosphatase 71 38 - 126 U/L   Total Bilirubin 0.7 0.3 - 1.2 mg/dL   GFR, Estimated >60 >60 mL/min    Comment: (NOTE) Calculated using the CKD-EPI Creatinine Equation (2021)    Anion gap 13 5 - 15    Comment: Performed at [facility], [address], [place], [place] [address]  CBC     Status: None   Collection Time: 05/09/22  4:30 PM  Result Value Ref Range   WBC 5.4 4.0 - 10.5 K/uL   RBC 4.42 3.87 - 5.11 MIL/uL   Hemoglobin 13.0 12.0 - 15.0 g/dL   HCT 41.4 36.0 - 46.0 %   MCV 93.7 80.0 - 100.0 fL   MCH 29.4 26.0 - 34.0 pg   MCHC 31.4 30.0 - 36.0 g/dL   RDW 13.1 11.5 - 15.5 %   Platelets 214 150 - 400 K/uL   nRBC 0.0 0.0 - 0.2 %    Comment: Performed at [facility], [address], [place], [place] [address]  ABO/Rh     Status: None   Collection Time: 05/09/22  4:30 PM  Result Value Ref Range   ABO/RH(D)      O NEG Performed at [facility], [address] [place], [place] [address]   Urinalysis, Routine w reflex microscopic -Urine, Clean Catch     Status: Abnormal   Collection Time: 05/09/22  4:40 PM  Result Value Ref Range   Color, Urine YELLOW YELLOW   APPearance HAZY (A) CLEAR   Specific Gravity, Urine 1.018 1.005 - 1.030   pH 5.0 5.0 - 8.0   Glucose, UA NEGATIVE NEGATIVE mg/dL   Hgb urine dipstick NEGATIVE NEGATIVE   Bilirubin Urine NEGATIVE NEGATIVE   Ketones, ur 20 (A) NEGATIVE mg/dL   Protein, ur 30 (A) NEGATIVE mg/dL   Nitrite NEGATIVE NEGATIVE   Leukocytes,Ua NEGATIVE NEGATIVE   RBC / HPF 0-5 0 - 5 RBC/hpf   WBC, UA 0-5 0 - 5 WBC/hpf   Bacteria, UA NONE SEEN NONE SEEN   Squamous Epithelial / HPF 0-5 0 - 5 /HPF   Mucus PRESENT     Comment: Performed at [facility], [address], [place], [place] [address]  Type and screen [facility]     Status: None (Preliminary result)   Collection Time: 05/09/22  7:26 PM  Result  Value Ref Range   ABO/RH(D) O NEG    Antibody Screen PENDING    Sample Expiration      05/12/2022,2359 Performed at [facility], [address] [place], [place] [address]    Personally reviewed- air and fluid around the colon in the pelvis, thickened colon, air foci all the way up to the gallbladder  CT ABDOMEN PELVIS W CONTRAST  Result Date: 05/09/2022 CLINICAL DATA:  Acute abdominal pain beginning 3 days ago. Diarrhea and chills. EXAM: CT ABDOMEN AND PELVIS WITH CONTRAST TECHNIQUE: Multidetector CT imaging of the abdomen and pelvis was performed using the standard protocol following bolus administration of intravenous contrast. RADIATION DOSE REDUCTION: This exam was performed according to the departmental dose-optimization program which includes automated exposure control, adjustment of the mA and/or kV according to patient size and/or use of iterative reconstruction technique. CONTRAST:  181m OMNIPAQUE IOHEXOL 300 MG/ML  SOLN COMPARISON:  CT of the abdomen and  pelvis 01/01/2022 FINDINGS: Lower chest: Mild dependent atelectasis is present. Lungs are otherwise clear. Heart size is normal. No significant pleural or pericardial effusion is present. Hepatobiliary: Liver is unremarkable. Layering gallstones are present without focal inflammatory changes about the gallbladder. The common bile duct is within normal limits. Pancreas: Unremarkable. No pancreatic ductal dilatation or surrounding inflammatory changes. Spleen: Normal in size without focal abnormality. Adrenals/Urinary Tract: Adrenal glands are unremarkable. Kidneys are normal, without renal calculi, focal lesion, or hydronephrosis. Bladder is unremarkable. Stomach/Bowel: The stomach and duodenum are within normal limits. Multiple dilated inflamed loops of bowel are present within the anatomic pelvis. No proximal obstruction is present. Diffuse inflammatory changes are present throughout the colon. Pneumoperitoneum is present. Locules of gas are  noted anteriorly along the peritoneum and into the porta hepatis. A developing fluid collection is present within the anatomic pelvis adjacent to the inflamed colon and irregular calcification is present adjacent to the collection. This may be a nidus for the infection. Additional free fluid is seen dependently within the anatomic pelvis. Vascular/Lymphatic: No significant vascular findings are present. No enlarged abdominal or pelvic lymph nodes. Reproductive: Status post hysterectomy. No adnexal masses. Musculoskeletal: Vertebral body heights and alignment are normal. No acute or focal osseous abnormalities are present. Hips are located and within normal limits bilaterally. IMPRESSION: 1. Hollow viscus perforation likely secondary to diffuse inflammatory colitis. 2. Developing fluid collection with gas in the central pelvis. 3. Adjacent 2 cm calcification which may be related to the nidus of infection/inflammation. 4. Multiple inflamed loops of small bowel without obstruction. This is likely secondary to the primary infection and extraluminal collection. 5. Additional free fluid within the anatomic pelvis. These results were called by telephone at the time of interpretation on 05/09/2022 at 6:38 pm to provider Dr. Rogene Houston , who verbally acknowledged these results. Electronically Signed   By: San Morelle M.D.   On: 05/09/2022 18:46     Assessment & Plan:  LAVERN CRIMI is a 77 y.o. female with a colon perforation and significant pain and free fluid and air. Discussed with patient need for colostomy and colectomy, reasons for perforation including diverticulitis, colitis, stercoral colitis from constipation, or something like cancer. Discussed risk of bleeding, infection, needing colostomy at least 3 months versus permanent, and risk of getting blood including allergic reaction and infections.    Discussed post operative course of being in the hospital with at least 4-5 days.  Risk of abscess from  the perforation.   All questions were answered to the satisfaction of the patient.   Virl Cagey 05/09/2022, 8:16 PM

## 2022-05-09 NOTE — Anesthesia Procedure Notes (Signed)
Procedure Name: Intubation Date/Time: 05/09/2022 9:10 PM  Performed by: Denese Killings, MDPre-anesthesia Checklist: Patient identified, Emergency Drugs available, Suction available and Patient being monitored Patient Re-evaluated:Patient Re-evaluated prior to induction Oxygen Delivery Method: Circle system utilized Preoxygenation: Pre-oxygenation with 100% oxygen Induction Type: IV induction, Rapid sequence and Cricoid Pressure applied Laryngoscope Size: Mac and 3 Grade View: Grade I Tube type: Oral Tube size: 7.0 mm Number of attempts: 1 Airway Equipment and Method: Stylet Placement Confirmation: ETT inserted through vocal cords under direct vision, positive ETCO2 and breath sounds checked- equal and bilateral Secured at: 21 cm Tube secured with: Tape Dental Injury: Teeth and Oropharynx as per pre-operative assessment

## 2022-05-09 NOTE — Transfer of Care (Signed)
Immediate Anesthesia Transfer of Care Note  Patient: Yvette Branch  Procedure(s) Performed: COLECTOMY WITH COLOSTOMY CREATION/HARTMANN PROCEDURE (Abdomen)  Patient Location: ICU  Anesthesia Type:General  Level of Consciousness: sedated and Patient remains intubated per anesthesia plan  Airway & Oxygen Therapy: Patient remains intubated per anesthesia plan  Post-op Assessment: Report given to RN and Post -op Vital signs reviewed and stable  Post vital signs: Reviewed and stable  Last Vitals:  Vitals Value Taken Time  BP 138/54 05/09/22 2254  Temp 97.4 05/09/22 2311  Pulse 78 05/09/22 2310  Resp 21 05/09/22 2310  SpO2 100 % 05/09/22 2310  Vitals shown include unvalidated device data.  Last Pain:  Vitals:   05/09/22 2000  TempSrc: Oral  PainSc: 9          Complications: No notable events documented.

## 2022-05-09 NOTE — ED Notes (Signed)
Constance Haw, MD at Pt bedside discussing surgery with Pt at this time.

## 2022-05-09 NOTE — Op Note (Signed)
[facility] Operative Note  05/09/22  Preoperative Diagnosis: Perforated bowel, pneumoperitoneum    Postoperative Diagnosis: Perforated sigmoid colon, fecal peritonitis    Procedure(s) Performed:  Exploratory laparotomy, partial colectomy, end colostomy    Surgeon: Lanell Matar. Constance Haw, MD   Assistants: No qualified resident was available    Anesthesia: General endotracheal   Anesthesiologist: Denese Killings, MD    Specimens: Sigmoid colon (suture proximal)    Estimated Blood Loss: 15cc   Blood Replacement: None    Complications: None   Wound Class: Dirty/ Infected    Operative Indications: Yvette Branch is a 77 yo who comes in with a colon perforation and fluid and free air. She had severe abdominal pain and we discussed colostomy placement. We discussed risk of bleeding, infection, finding cancer or some other etiology, needing the colostomy for at least 3 months if not longer.   Findings: Fecal peritonitis, sigmoid colon perforation with thickened inflamed colon   Procedure: The patient was taken to the operating room and placed supine. General endotracheal anesthesia was induced. Intravenous antibiotics were  administered per protocol.  An nasogastric tube positioned to decompress the stomach. A foley catheter was placed. The abdomen was prepared and draped in the usual sterile fashion.   The midline incision was made and carried down to the fascia. The fascia was opened with care and upon entering the abdomen (organ space), I encountered purulent ascites and fecal peritonitis.  The fluid was cultured.  The abdomen was suctioned out. The small bowel was packed in the right upper quadrant.  The perforation was noted in the sigmoid colon where the colon was thickened and inflamed.    The sigmoid colon was mobilized at the pelvic brim and down into the pelvis with cautery. The left ureter was identified and protected. The proximal point of transition was taken  with a 75 mm linear cutting stapler. The mesentery was taken with a Ligasure. The distal point of transection was taken with a contour 60 stapler.    The abdomen was irrigated copiously. The packing was removed. The colostomy site was made in the left abdomen and the end of the colon was pulled through the cruciate incision in the fascia that was over 2 fingerbreadths in size.  The colon was pulled through with no twisting or narrowing.    The rectal stump was marked with a prolene suture. The midline was closed with 0 PDS in the standard fashion.  The midline was covered with a towel and the colostomy was matured in the standard fashion with 3-0 Chromic gut sutures.   The midline was then packed with saline dampened gauze and dressing.  The wafer and bag were placed.   Final inspection revealed acceptable hemostasis. All counts were correct at the end of the case. The patient was awakened from anesthesia and extubated without complication.  The patient went to the PACU in stable condition.   Yvette Labrum, MD [facility] [address] [place],  [address] [phone] (office)

## 2022-05-09 NOTE — ED Notes (Signed)
Assisted patient to bathroom unable to provide urine specimen, will notify EDP

## 2022-05-09 NOTE — Anesthesia Preprocedure Evaluation (Addendum)
Anesthesia Evaluation  Patient identified by MRN, date of birth, ID band Patient awake    Reviewed: Allergy & Precautions, H&P , NPO status , Patient's Chart, lab work & pertinent test results  History of Anesthesia Complications (+) PROLONGED EMERGENCE and history of anesthetic complications  Airway Mallampati: I  TM Distance: >3 FB Neck ROM: Full    Dental  (+) Dental Advisory Given, Teeth Intact   Pulmonary neg pulmonary ROS   Pulmonary exam normal breath sounds clear to auscultation       Cardiovascular negative cardio ROS Normal cardiovascular exam Rhythm:Regular Rate:Normal     Neuro/Psych  PSYCHIATRIC DISORDERS Anxiety     negative neurological ROS     GI/Hepatic Neg liver ROS,GERD  ,,  Endo/Other  Hypothyroidism    Renal/GU negative Renal ROS  negative genitourinary   Musculoskeletal  (+) Arthritis ,    Abdominal   Peds negative pediatric ROS (+)  Hematology negative hematology ROS (+)   Anesthesia Other Findings   Reproductive/Obstetrics negative OB ROS                             Anesthesia Physical Anesthesia Plan  ASA: 3 and emergent  Anesthesia Plan: General   Post-op Pain Management: Dilaudid IV   Induction: Intravenous, Rapid sequence and Cricoid pressure planned  PONV Risk Score and Plan: 4 or greater and Dexamethasone and Ondansetron  Airway Management Planned: Oral ETT  Additional Equipment:   Intra-op Plan:   Post-operative Plan: Possible Post-op intubation/ventilation and Extubation in OR  Informed Consent: I have reviewed the patients History and Physical, chart, labs and discussed the procedure including the risks, benefits and alternatives for the proposed anesthesia with the patient or authorized representative who has indicated his/her understanding and acceptance.     Dental advisory given  Plan Discussed with: CRNA and Surgeon  Anesthesia  Plan Comments:        Anesthesia Quick Evaluation

## 2022-05-09 NOTE — Progress Notes (Signed)
[facility]  Updated family. Patient staying intubated overnight. Has been stable.   Precedex and PRN versed.  Morphine for pain PRN Vent CXR ordered to confirm ET NG, NPO Colostomy care Midline wound packing to start tomorrow LR @ 75 Labs in AM Cefotetan for intraabdominal abscess, culture pending Scds, heparin sq Hopefully extubates tomorrow.  Curlene Labrum, MD [facility] [address] [place], [place] [address] [phone] (office)

## 2022-05-09 NOTE — ED Triage Notes (Signed)
Pt c/o abd pain since Thurs. Pt c/o diarrhea and chills but no n/v.

## 2022-05-09 NOTE — Progress Notes (Signed)
[facility] Progress Note Patient Name: Yvette Branch DOB: 11-02-1945 MRN: [id number]   Date of Service  05/09/2022  HPI/Events of Note  42F post-op patient. Admitted to surgery for perforated bowel s/p partial colectomy, end colostomy. Transferred to ICU post-op on MV  CXR reviewed. ETT in appropriate position  eICU Interventions  Remains intubated overnight per primary team Plan for extubation in am  Continue low-dose Precedex for RASS -1 Wean pressor for MAP goal >65 On cefotetan, cultures pending     Intervention Category Evaluation Type: New Patient Evaluation  Carroll Ranney Rodman Pickle 05/09/2022, 11:13 PM

## 2022-05-10 ENCOUNTER — Inpatient Hospital Stay (HOSPITAL_COMMUNITY): Payer: Medicare Other

## 2022-05-10 DIAGNOSIS — R198 Other specified symptoms and signs involving the digestive system and abdomen: Secondary | ICD-10-CM

## 2022-05-10 DIAGNOSIS — J9601 Acute respiratory failure with hypoxia: Secondary | ICD-10-CM | POA: Diagnosis not present

## 2022-05-10 LAB — BLOOD GAS, ARTERIAL
Acid-Base Excess: 0.5 mmol/L (ref 0.0–2.0)
Bicarbonate: 24.5 mmol/L (ref 20.0–28.0)
Drawn by: 27407
FIO2: 40 %
O2 Saturation: 100 %
Patient temperature: 36.8
pCO2 arterial: 36 mmHg (ref 32–48)
pH, Arterial: 7.44 (ref 7.35–7.45)
pO2, Arterial: 150 mmHg — ABNORMAL HIGH (ref 83–108)

## 2022-05-10 LAB — CBC WITH DIFFERENTIAL/PLATELET
Abs Immature Granulocytes: 0.2 10*3/uL — ABNORMAL HIGH (ref 0.00–0.07)
Band Neutrophils: 17 %
Basophils Absolute: 0 10*3/uL (ref 0.0–0.1)
Basophils Relative: 0 %
Eosinophils Absolute: 0 10*3/uL (ref 0.0–0.5)
Eosinophils Relative: 0 %
HCT: 33.6 % — ABNORMAL LOW (ref 36.0–46.0)
Hemoglobin: 10.9 g/dL — ABNORMAL LOW (ref 12.0–15.0)
Lymphocytes Relative: 9 %
Lymphs Abs: 0.6 10*3/uL — ABNORMAL LOW (ref 0.7–4.0)
MCH: 29.7 pg (ref 26.0–34.0)
MCHC: 32.4 g/dL (ref 30.0–36.0)
MCV: 91.6 fL (ref 80.0–100.0)
Metamyelocytes Relative: 3 %
Monocytes Absolute: 0.5 10*3/uL (ref 0.1–1.0)
Monocytes Relative: 8 %
Neutro Abs: 5 10*3/uL (ref 1.7–7.7)
Neutrophils Relative %: 63 %
Platelets: 210 10*3/uL (ref 150–400)
RBC: 3.67 MIL/uL — ABNORMAL LOW (ref 3.87–5.11)
RDW: 13.1 % (ref 11.5–15.5)
WBC: 6.3 10*3/uL (ref 4.0–10.5)
nRBC: 0 % (ref 0.0–0.2)

## 2022-05-10 LAB — COMPREHENSIVE METABOLIC PANEL
ALT: 14 U/L (ref 0–44)
AST: 24 U/L (ref 15–41)
Albumin: 2.6 g/dL — ABNORMAL LOW (ref 3.5–5.0)
Alkaline Phosphatase: 54 U/L (ref 38–126)
Anion gap: 10 (ref 5–15)
BUN: 12 mg/dL (ref 8–23)
CO2: 20 mmol/L — ABNORMAL LOW (ref 22–32)
Calcium: 7.9 mg/dL — ABNORMAL LOW (ref 8.9–10.3)
Chloride: 104 mmol/L (ref 98–111)
Creatinine, Ser: 0.73 mg/dL (ref 0.44–1.00)
GFR, Estimated: >60 mL/min (ref >60)
Glucose, Bld: 119 mg/dL — ABNORMAL HIGH (ref 70–99)
Potassium: 3.7 mmol/L (ref 3.5–5.1)
Sodium: 134 mmol/L — ABNORMAL LOW (ref 135–145)
Total Bilirubin: 0.7 mg/dL (ref 0.3–1.2)
Total Protein: 5.8 g/dL — ABNORMAL LOW (ref 6.5–8.1)

## 2022-05-10 LAB — PHOSPHORUS: Phosphorus: 3.5 mg/dL (ref 2.5–4.6)

## 2022-05-10 LAB — GLUCOSE, CAPILLARY
Glucose-Capillary: 130 mg/dL — ABNORMAL HIGH (ref 70–99)
Glucose-Capillary: 112 mg/dL — ABNORMAL HIGH (ref 70–99)
Glucose-Capillary: 99 mg/dL (ref 70–99)
Glucose-Capillary: 90 mg/dL (ref 70–99)

## 2022-05-10 LAB — MAGNESIUM: Magnesium: 1.8 mg/dL (ref 1.7–2.4)

## 2022-05-10 MED ORDER — LEVOTHYROXINE SODIUM 50 MCG PO TABS
50.0000 ug | ORAL_TABLET | Freq: Every day | ORAL | Status: DC
  Administered 2022-05-10 – 2022-05-17 (×8): 50 ug via ORAL
  Filled 2022-05-10 (×3): qty 1
  Filled 2022-05-10: qty 2
  Filled 2022-05-10 (×4): qty 1
  Filled 2022-05-10: qty 2

## 2022-05-10 MED ORDER — LACTATED RINGERS IV BOLUS
1000.0000 mL | Freq: Once | INTRAVENOUS | Status: AC
  Administered 2022-05-10: 1000 mL via INTRAVENOUS

## 2022-05-10 MED ORDER — NAPHAZOLINE-PHENIRAMINE 0.025-0.3 % OP SOLN
1.0000 [drp] | Freq: Four times a day (QID) | OPHTHALMIC | Status: DC | PRN
  Filled 2022-05-10: qty 5

## 2022-05-10 MED ORDER — POLYVINYL ALCOHOL 1.4 % OP SOLN
1.0000 [drp] | OPHTHALMIC | Status: DC | PRN
  Administered 2022-05-10: 2 [drp] via OPHTHALMIC
  Administered 2022-05-11 – 2022-05-12 (×2): 1 [drp] via OPHTHALMIC
  Filled 2022-05-10: qty 15

## 2022-05-10 MED ORDER — NAPHAZOLINE-GLYCERIN 0.012-0.25 % OP SOLN
1.0000 [drp] | Freq: Four times a day (QID) | OPHTHALMIC | Status: DC | PRN
  Filled 2022-05-10: qty 15

## 2022-05-10 MED ORDER — POTASSIUM CHLORIDE 10 MEQ/100ML IV SOLN
10.0000 meq | INTRAVENOUS | Status: AC
  Administered 2022-05-10 (×3): 10 meq via INTRAVENOUS
  Filled 2022-05-10 (×3): qty 100

## 2022-05-10 MED ORDER — CHLORHEXIDINE GLUCONATE CLOTH 2 % EX PADS
6.0000 | MEDICATED_PAD | Freq: Every day | CUTANEOUS | Status: DC
  Administered 2022-05-10 – 2022-05-17 (×7): 6 via TOPICAL

## 2022-05-10 MED ORDER — MAGNESIUM SULFATE 2 GM/50ML IV SOLN
2.0000 g | Freq: Once | INTRAVENOUS | Status: AC
  Administered 2022-05-10: 2 g via INTRAVENOUS
  Filled 2022-05-10: qty 50

## 2022-05-10 NOTE — Anesthesia Postprocedure Evaluation (Signed)
Anesthesia Post Note  Patient: HENCHY MCCAULEY  Procedure(s) Performed: COLECTOMY WITH COLOSTOMY CREATION/HARTMANN PROCEDURE (Abdomen)  Patient location during evaluation: ICU Anesthesia Type: General Level of consciousness: awake and alert and oriented Pain management: pain level controlled Vital Signs Assessment: post-procedure vital signs reviewed and stable Respiratory status: spontaneous breathing, nonlabored ventilation, respiratory function stable and patient connected to nasal cannula oxygen Cardiovascular status: blood pressure returned to baseline and stable Postop Assessment: no apparent nausea or vomiting Anesthetic complications: no Comments: Patient was extubated and doing well.  No notable events documented.   Last Vitals:  Vitals:   05/10/22 1028 05/10/22 1030  BP: 94/68 (!) 106/44  Pulse: (!) 57 (!) 56  Resp: 12 12  Temp:    SpO2: 98% 100%    Last Pain:  Vitals:   05/10/22 0744  TempSrc: Axillary  PainSc:                  Dorsie Burich C Jozi Malachi

## 2022-05-10 NOTE — Progress Notes (Signed)
[facility] Progress Note  1 Day Post-Op  Subjective: Extubated this AM. Off neo. Feeling good.   Objective: Vital signs in last 24 hours: Temp:  [97.6 F (36.4 C)-99 F (37.2 C)] 97.6 F (36.4 C) (01/29 0744) Pulse Rate:  [51-100] 56 (01/29 1030) Resp:  [10-24] 12 (01/29 1030) BP: (91-155)/(33-75) 106/44 (01/29 1030) SpO2:  [92 %-100 %] 100 % (01/29 1030) FiO2 (%):  [40 %] 40 % (01/29 0746) Weight:  [48.5 kg] 48.5 kg (01/28 1555)    Intake/Output from previous day: 01/28 0701 - 01/29 0700 In: 3710.5 [I.V.:3060.5; IV Piggyback:650] Out: 790 [Urine:700; Blood:15] Intake/Output this shift: Total I/O In: 435.2 [I.V.:335.2; IV Piggyback:100] Out: -   General appearance: alert and no distress Resp: normal work of breathing post extubation  GI: soft, ostomy with edematous and slightly dusky, no gas in bag, midline with packing  Lab Results:  Recent Labs    05/09/22 1630 05/10/22 0405  WBC 5.4 6.3  HGB 13.0 10.9*  HCT 41.4 33.6*  PLT 214 210   BMET Recent Labs    05/09/22 1630 05/10/22 0405  NA 135 134*  K 4.1 3.7  CL 97* 104  CO2 25 20*  GLUCOSE 139* 119*  BUN 17 12  CREATININE 0.85 0.73  CALCIUM 9.2 7.9*   PT/INR Recent Labs    05/09/22 1622  LABPROT 13.3  INR 1.0    Studies/Results: DG Chest Port 1 View  Result Date: 05/10/2022 CLINICAL DATA:  [id number] with ventilator dependent respiratory failure. EXAM: PORTABLE CHEST 1 VIEW COMPARISON:  Portable chest yesterday at 10:59 p.m. FINDINGS: Stable endotracheal positioning with ETT tip in the mid thoracic trachea. NGT has been advanced. The side-hole is no longer at the hiatus with the full intragastric course of the tube is not filmed. Neither the side-hole or tip of the tube are in the field. There is increased gastric air distention. Multiple overlying monitor wires. There is a stable mediastinum with aortic tortuosity and calcification, normal cardiac size. The lungs are slightly  emphysematous but clear. No pleural effusion is seen. Osteopenia. IMPRESSION: 1. NGT has been advanced. The side-hole is no longer at the hiatus but the full intragastric course of the tube not filmed. 2. Increased gastric air distention. 3. No other change. Electronically Signed   By: Telford Nab M.D.   On: 05/10/2022 06:51   DG Chest Port 1 View  Result Date: 05/09/2022 CLINICAL DATA:  Check endotracheal tube placement EXAM: PORTABLE CHEST 1 VIEW COMPARISON:  01/13/2012 FINDINGS: Cardiac shadow is within normal limits. Endotracheal tube is noted in satisfactory position 3 cm above the carina. Gastric catheter shows the tip in the stomach although the proximal side port is seen in the distal esophagus. This should be advanced several cm deeper into the stomach. The lungs are clear. No bony abnormality is noted. IMPRESSION: Endotracheal tube in satisfactory position. Gastric catheter should be advanced deeper into the stomach. No acute abnormality noted. Electronically Signed   By: Inez Catalina M.D.   On: 05/09/2022 23:15   CT ABDOMEN PELVIS W CONTRAST  Result Date: 05/09/2022 CLINICAL DATA:  Acute abdominal pain beginning 3 days ago. Diarrhea and chills. EXAM: CT ABDOMEN AND PELVIS WITH CONTRAST TECHNIQUE: Multidetector CT imaging of the abdomen and pelvis was performed using the standard protocol following bolus administration of intravenous contrast. RADIATION DOSE REDUCTION: This exam was performed according to the departmental dose-optimization program which includes automated exposure control, adjustment of the mA and/or kV according to patient  size and/or use of iterative reconstruction technique. CONTRAST:  135m OMNIPAQUE IOHEXOL 300 MG/ML  SOLN COMPARISON:  CT of the abdomen and pelvis 01/01/2022 FINDINGS: Lower chest: Mild dependent atelectasis is present. Lungs are otherwise clear. Heart size is normal. No significant pleural or pericardial effusion is present. Hepatobiliary: Liver is  unremarkable. Layering gallstones are present without focal inflammatory changes about the gallbladder. The common bile duct is within normal limits. Pancreas: Unremarkable. No pancreatic ductal dilatation or surrounding inflammatory changes. Spleen: Normal in size without focal abnormality. Adrenals/Urinary Tract: Adrenal glands are unremarkable. Kidneys are normal, without renal calculi, focal lesion, or hydronephrosis. Bladder is unremarkable. Stomach/Bowel: The stomach and duodenum are within normal limits. Multiple dilated inflamed loops of bowel are present within the anatomic pelvis. No proximal obstruction is present. Diffuse inflammatory changes are present throughout the colon. Pneumoperitoneum is present. Locules of gas are noted anteriorly along the peritoneum and into the porta hepatis. A developing fluid collection is present within the anatomic pelvis adjacent to the inflamed colon and irregular calcification is present adjacent to the collection. This may be a nidus for the infection. Additional free fluid is seen dependently within the anatomic pelvis. Vascular/Lymphatic: No significant vascular findings are present. No enlarged abdominal or pelvic lymph nodes. Reproductive: Status post hysterectomy. No adnexal masses. Musculoskeletal: Vertebral body heights and alignment are normal. No acute or focal osseous abnormalities are present. Hips are located and within normal limits bilaterally. IMPRESSION: 1. Hollow viscus perforation likely secondary to diffuse inflammatory colitis. 2. Developing fluid collection with gas in the central pelvis. 3. Adjacent 2 cm calcification which may be related to the nidus of infection/inflammation. 4. Multiple inflamed loops of small bowel without obstruction. This is likely secondary to the primary infection and extraluminal collection. 5. Additional free fluid within the anatomic pelvis. These results were called by telephone at the time of interpretation on  05/09/2022 at 6:38 pm to provider Dr. ZRogene Houston, who verbally acknowledged these results. Electronically Signed   By: CSan MorelleM.D.   On: 05/09/2022 18:46    Anti-infectives: Anti-infectives (From admission, onward)    Start     Dose/Rate Route Frequency Ordered Stop   05/10/22 1000  cefoTEtan (CEFOTAN) 2 g in sodium chloride 0.9 % 100 mL IVPB        2 g 200 mL/hr over 30 Minutes Intravenous Every 12 hours 05/09/22 2241 05/15/22 0959   05/10/22 0600  cefoTEtan (CEFOTAN) 2 g in sodium chloride 0.9 % 100 mL IVPB        2 g 200 mL/hr over 30 Minutes Intravenous On call to O.R. 05/09/22 2112 05/09/22 2132   05/09/22 2024  sodium chloride 0.9 % with cefoTEtan (CEFOTAN) ADS Med       Note to Pharmacy: SLeeann MustM: cabinet override      05/09/22 2024 05/09/22 2113   05/09/22 1900  piperacillin-tazobactam (ZOSYN) IVPB 3.375 g        3.375 g 100 mL/hr over 30 Minutes Intravenous  Once 05/09/22 1847 05/09/22 2013       Assessment/Plan: Patient s/p colectomy colostomy for perforation.  PRN For pain IS, OOB HD ok NG, NPO Awaiting bowel function Colostomy RN  Wound packing Synthroid ordered 557m Labs tomorrow K replaced Foley out SCDs, heparin Updated Rena    LOS: 1 day    LiVirl Cagey/29/2024

## 2022-05-10 NOTE — Progress Notes (Signed)
Started patient back on low dose of neo due to soft blood pressures with SBP in the 80's and map <65. Dr Constance Haw made aware. Patient is alert and oriented with no complaints at this time.

## 2022-05-10 NOTE — Consult Note (Signed)
NAME:  Yvette Branch, MRN:  [id number], DOB:  [birth year], LOS: 1 ADMISSION DATE:  05/09/2022, CONSULTATION DATE:  1/29 REFERRING MD:  Constance Haw, CHIEF COMPLAINT:  post op resp failure    History of Present Illness:  64 yowf never smoker admittd thru er PM 1/28 with apparent bowel perforation  Postoperative Diagnosis: Perforated sigmoid colon, fecal peritonitis    Procedure(s) Performed:  Exploratory laparotomy, partial colectomy, end colostomy   Remained on vent and PCCM service asked to eval post op     Significant Hospital Events: Including procedures, antibiotic start and stop dates in addition to other pertinent events   ET 1/28 > out AM 1/29   Scheduled Meds:  Chlorhexidine Gluconate Cloth  6 each Topical Daily   heparin  5,000 Units Subcutaneous Q8H   levothyroxine  50 mcg Oral Q0600   pantoprazole (PROTONIX) IV  40 mg Intravenous QHS   Continuous Infusions:  sodium chloride Stopped (05/09/22 2337)   cefoTEtan (CEFOTAN) IV Stopped (05/10/22 1034)   lactated ringers 75 mL/hr at 05/10/22 1107   phenylephrine (NEO-SYNEPHRINE) Adult infusion Stopped (05/10/22 0530)   potassium chloride 10 mEq (05/10/22 1213)   PRN Meds:.diphenhydrAMINE **OR** diphenhydrAMINE, metoprolol tartrate, morphine injection, naphazoline-glycerin, ondansetron **OR** ondansetron (ZOFRAN) IV   Interim History / Subjective:  Off pressors, in chair s/p extubation this asm   Objective   Blood pressure (!) 106/44, pulse (!) 56, temperature 97.6 F (36.4 C), temperature source Axillary, resp. rate 12, height '5\' 2"'$  (1.575 m), weight 48.5 kg, SpO2 100 %.    Vent Mode: PSV;CPAP FiO2 (%):  [40 %] 40 % Set Rate:  [18 bmp] 18 bmp Vt Set:  [400 mL] 400 mL PEEP:  [5 cmH20] 5 cmH20 Pressure Support:  [10 cmH20] 10 cmH20 Plateau Pressure:  [12 cmH20] 12 cmH20   Intake/Output Summary (Last 24 hours) at 05/10/2022 1245 Last data filed at 05/10/2022 1057 Gross per 24 hour  Intake 4145.78 ml  Output 790 ml  Net  3355.78 ml   Filed Weights   05/09/22 1555  Weight: 48.5 kg    Examination: Tmax:  99 General appearance:    bright eyed wf younger than stated age in appearance, nad up in chair at 45 degrees inclination   At Rest 02 sats  100% on 1lpm   No jvd Oropharynx clear, NG in place Neck supple Lungs clearbilaterally RRR no s3 or or sign murmur Abd typical post op findings/ colostomy bag inplace  Extr warm with no edema or clubbing noted Neuro  Sensorium intact,  no apparent motor deficits     I personally reviewed images and agree with radiology impression as follows:  CXR:   portable AM 1/29 Lungs clear/ no effusions   Assessment & Plan:  1) Post op vent dep acute resp failure > extubated to 1lpm with good cough mechanics, nl voice texture and no increased wob/ sats good on low flow NP  >>> mobilize/ IS/ wean 02 off for sats > 90%  Low risk for pulmonary complications in the absence of abd complications (ileus with vomiting or abscess/sepsis for example )  Will f/u while in ICU and prn        Labs   CBC: Recent Labs  Lab 05/09/22 1630 05/10/22 0405  WBC 5.4 6.3  NEUTROABS  --  5.0  HGB 13.0 10.9*  HCT 41.4 33.6*  MCV 93.7 91.6  PLT 214 237    Basic Metabolic Panel: Recent Labs  Lab 05/09/22 1630 05/10/22 0405  NA 135 134*  K 4.1 3.7  CL 97* 104  CO2 25 20*  GLUCOSE 139* 119*  BUN 17 12  CREATININE 0.85 0.73  CALCIUM 9.2 7.9*  MG  --  1.8  PHOS  --  3.5   GFR: Estimated Creatinine Clearance: 46.5 mL/min (by C-G formula based on SCr of 0.73 mg/dL). Recent Labs  Lab 05/09/22 1630 05/10/22 0405  WBC 5.4 6.3    Liver Function Tests: Recent Labs  Lab 05/09/22 1630 05/10/22 0405  AST 31 24  ALT 20 14  ALKPHOS 71 54  BILITOT 0.7 0.7  PROT 8.4* 5.8*  ALBUMIN 4.1 2.6*   Recent Labs  Lab 05/09/22 1630  LIPASE 34   No results for input(s): "AMMONIA" in the last 168 hours.  ABG    Component Value Date/Time   PHART 7.44 05/10/2022 0605    PCO2ART 36 05/10/2022 0605   PO2ART 150 (H) 05/10/2022 0605   HCO3 24.5 05/10/2022 0605   O2SAT 100 05/10/2022 0605     Coagulation Profile: Recent Labs  Lab 05/09/22 1622  INR 1.0    Cardiac Enzymes: No results for input(s): "CKTOTAL", "CKMB", "CKMBINDEX", "TROPONINI" in the last 168 hours.  HbA1C: No results found for: "HGBA1C"  CBG: Recent Labs  Lab 05/10/22 0725 05/10/22 1210  GLUCAP 130* 112*      Past Medical History:  She,  has a past medical history of Abdominal abscess, Anxiety, Arthritis, Bowel obstruction ([place]), Complication of anesthesia, Degenerative arthritis of lumbar spine, GERD (gastroesophageal reflux disease), Hot flashes, menopausal, Intra-abdominal abscess post-procedure, Malnutrition ([place]), Osteoporosis, Rectal prolapse, and Vaginal prolapse.   Surgical History:   Past Surgical History:  Procedure Laterality Date   ABDOMINAL HYSTERECTOMY     ABDOMINAL SURGERY     bowel obstruction     COLONOSCOPY N/A 08/02/2014   Procedure: COLONOSCOPY;  Surgeon: Rogene Houston, MD;  Location: [facility];  Service: Endoscopy;  Laterality: N/A;  1240   TONSILLECTOMY       Social History:   reports that she has never smoked. She has been exposed to tobacco smoke. She has never used smokeless tobacco. She reports current alcohol use. She reports that she does not use drugs.   Family History:  Her family history includes Cancer in her mother and sister.   Allergies Allergies  Allergen Reactions   Doxycycline Other (See Comments)    Bad vision   Metronidazole Hives, Diarrhea and Rash    Numbness in face     Home Medications  Prior to Admission medications   Medication Sig Start Date End Date Taking? Authorizing Provider  ALPRAZolam (XANAX) 0.25 MG tablet Take 0.25 mg by mouth 4 (four) times daily as needed. For anxiety    [provider]  aspirin EC 81 MG tablet Take 81 mg by mouth daily.    [provider]  Black Cohosh 540 MG CAPS  Take 540 mg by mouth daily.     [provider]  Coenzyme Q10 (CO Q 10 PO) Take 1 tablet by mouth daily.     [provider]  docusate sodium (COLACE) 100 MG capsule Take 1 capsule (100 mg total) by mouth 2 (two) times daily. 08/02/14   Rogene Houston, MD  glucosamine-chondroitin 500-400 MG tablet Take 2 tablets by mouth daily.    [provider]  levothyroxine (SYNTHROID, LEVOTHROID) 50 MCG tablet Take 50 mcg by mouth daily before breakfast.  02/17/15   [provider]  Multiple Vitamins-Minerals (  HAIR/SKIN/NAILS/BIOTIN) TABS Take by mouth daily.    [provider]  Multiple Vitamins-Minerals (OCUVITE ADULT 50+) CAPS Take by mouth daily.    [provider]  Omega-3 Fatty Acids (FISH OIL) 1200 MG CAPS Take 1 capsule by mouth daily.    [provider]  polyethylene glycol (MIRALAX / GLYCOLAX) 17 g packet Take 17 g by mouth at bedtime. For constipation 11/28/18   Rogene Houston, MD  Probiotic Product (ALIGN) 4 MG CAPS Take 1 capsule by mouth daily. 01/18/12   Kathie Dike, MD  pyridOXINE (VITAMIN B-6) 100 MG tablet Take 100 mg by mouth daily.    [provider]  UNABLE TO FIND Take 1 tablet by mouth 4 (four) times daily. Med Name: OSTEO-SHEATH (ENHANCED BONE SUPPORT-CALCIUM SUPPLEMENT) Includes: Calcium '1221mg'$  and Vitamin D3 1200IU    [provider]  vitamin A 8000 UNIT capsule Take 8,000 Units by mouth daily.    [provider]  vitamin C (ASCORBIC ACID) 500 MG tablet Take 500 mg by mouth daily.    [provider]  vitamin E 400 UNIT capsule Take 400 Units by mouth daily.    [provider]  vitamin k 100 MCG tablet Take 100 mcg by mouth daily.     [provider]      Christinia Gully, MD Pulmonary and [place] [phone]   After 7:00 pm call Elink  [phone]

## 2022-05-10 NOTE — Progress Notes (Signed)
  Transition of Care [facility]) Screening Note   Patient Details  Name: Yvette Branch Date of Birth: Jul 09, 1945   Transition of Care [facility]) CM/SW Contact:    Iona Beard, [place] Phone Number: 05/10/2022, 11:05 AM    Transition of Care Department [facility]) has reviewed patient and no TOC needs have been identified at this time. We will continue to monitor patient advancement through interdisciplinary progression rounds. If new patient transition needs arise, please place a TOC consult.

## 2022-05-10 NOTE — Consult Note (Signed)
[facility] team following patient for new end colostomy placement 05/09/2022.  Patient currently in ICU, spoke with niece Jennette Banker who would like to be present at initial ostomy education session.  Plan is for ostomy nurse to meet with patient and niece at 28 noon on 05/12/2022.    Thank you,    Melannie Metzner MSN, RN-BC, [employer]

## 2022-05-10 NOTE — Procedures (Signed)
Extubation Procedure Note  Patient Details:   Name: Yvette Branch DOB: 1945/10/22 MRN: [id number]   Airway Documentation:    Vent end date: 05/10/22 Vent end time: 1024   Evaluation  O2 sats: stable throughout Complications: No apparent complications Patient did tolerate procedure well. Bilateral Breath Sounds: Clear, Diminished   Yes  Patient extubated and placed on 1L nasal cannula. Patient able to speak clearly and cough is adequate considering recent surgery.  Blanchie Serve 05/10/2022, 10:30 AM

## 2022-05-11 LAB — CBC WITH DIFFERENTIAL/PLATELET
Abs Immature Granulocytes: 1.3 10*3/uL — ABNORMAL HIGH (ref 0.00–0.07)
Band Neutrophils: 4 %
Basophils Absolute: 0 10*3/uL (ref 0.0–0.1)
Basophils Relative: 0 %
Eosinophils Absolute: 0 10*3/uL (ref 0.0–0.5)
Eosinophils Relative: 0 %
HCT: 32.2 % — ABNORMAL LOW (ref 36.0–46.0)
Hemoglobin: 10.1 g/dL — ABNORMAL LOW (ref 12.0–15.0)
Lymphocytes Relative: 10 %
Lymphs Abs: 1.1 10*3/uL (ref 0.7–4.0)
MCH: 29.4 pg (ref 26.0–34.0)
MCHC: 31.4 g/dL (ref 30.0–36.0)
MCV: 93.9 fL (ref 80.0–100.0)
Metamyelocytes Relative: 11 %
Monocytes Absolute: 0.4 10*3/uL (ref 0.1–1.0)
Monocytes Relative: 4 %
Myelocytes: 1 %
Neutro Abs: 7.9 10*3/uL — ABNORMAL HIGH (ref 1.7–7.7)
Neutrophils Relative %: 70 %
Platelets: 201 10*3/uL (ref 150–400)
RBC: 3.43 MIL/uL — ABNORMAL LOW (ref 3.87–5.11)
RDW: 13.2 % (ref 11.5–15.5)
WBC: 10.7 10*3/uL — ABNORMAL HIGH (ref 4.0–10.5)
nRBC: 0 % (ref 0.0–0.2)

## 2022-05-11 LAB — BASIC METABOLIC PANEL
Anion gap: 7 (ref 5–15)
BUN: 14 mg/dL (ref 8–23)
CO2: 25 mmol/L (ref 22–32)
Calcium: 8.1 mg/dL — ABNORMAL LOW (ref 8.9–10.3)
Chloride: 107 mmol/L (ref 98–111)
Creatinine, Ser: 0.76 mg/dL (ref 0.44–1.00)
GFR, Estimated: >60 mL/min (ref >60)
Glucose, Bld: 97 mg/dL (ref 70–99)
Potassium: 4.2 mmol/L (ref 3.5–5.1)
Sodium: 139 mmol/L (ref 135–145)

## 2022-05-11 LAB — GLUCOSE, CAPILLARY
Glucose-Capillary: 77 mg/dL (ref 70–99)
Glucose-Capillary: 82 mg/dL (ref 70–99)
Glucose-Capillary: 86 mg/dL (ref 70–99)
Glucose-Capillary: 88 mg/dL (ref 70–99)
Glucose-Capillary: 94 mg/dL (ref 70–99)
Glucose-Capillary: 95 mg/dL (ref 70–99)

## 2022-05-11 LAB — PHOSPHORUS: Phosphorus: 2.3 mg/dL — ABNORMAL LOW (ref 2.5–4.6)

## 2022-05-11 LAB — MAGNESIUM: Magnesium: 2.2 mg/dL (ref 1.7–2.4)

## 2022-05-11 MED ORDER — KCL IN DEXTROSE-NACL 20-5-0.45 MEQ/L-%-% IV SOLN: INTRAVENOUS | Status: DC

## 2022-05-11 MED ORDER — PIPERACILLIN-TAZOBACTAM 3.375 G IVPB
3.3750 g | Freq: Three times a day (TID) | INTRAVENOUS | Status: AC
  Administered 2022-05-11 – 2022-05-16 (×14): 3.375 g via INTRAVENOUS
  Filled 2022-05-11 (×14): qty 50

## 2022-05-11 MED ORDER — PIPERACILLIN-TAZOBACTAM 3.375 G IVPB
3.3750 g | Freq: Once | INTRAVENOUS | Status: AC
  Administered 2022-05-11: 3.375 g via INTRAVENOUS
  Filled 2022-05-11: qty 50

## 2022-05-11 MED ORDER — SODIUM PHOSPHATES 45 MMOLE/15ML IV SOLN
30.0000 mmol | Freq: Once | INTRAVENOUS | Status: AC
  Administered 2022-05-11: 30 mmol via INTRAVENOUS
  Filled 2022-05-11: qty 10

## 2022-05-11 NOTE — Progress Notes (Signed)
Pt repositioned and comfortable in bed with call light in reach. Abdominal pad clean dry and intact, colostomy minimal drainage. Suction low and intermittent.

## 2022-05-11 NOTE — Progress Notes (Signed)
[facility] Progress Note  2 Days Post-Op  Subjective: Up to the commode and feeling good. NG in place. No output from the colostomy.   Objective: Vital signs in last 24 hours: Temp:  [97.9 F (36.6 C)-98.4 F (36.9 C)] 98.4 F (36.9 C) (01/30 1122) Pulse Rate:  [55-79] 78 (01/30 1319) Resp:  [13-23] 17 (01/30 1319) BP: (102-149)/(39-62) 119/50 (01/30 1319) SpO2:  [94 %-100 %] 100 % (01/30 1319)    Intake/Output from previous day: 01/29 0701 - 01/30 0700 In: 2036.3 [I.V.:936.1; IV Piggyback:1100.3] Out: 1750 [Urine:1750] Intake/Output this shift: Total I/O In: 999.1 [I.V.:783.9; IV Piggyback:215.3] Out: 150 [Urine:150]  General appearance: alert and no distress GI: soft, dressing in place, ostomy slightly dusky but overall looking good with some edema   Lab Results:  Recent Labs    05/10/22 0405 05/11/22 0428  WBC 6.3 10.7*  HGB 10.9* 10.1*  HCT 33.6* 32.2*  PLT 210 201   BMET Recent Labs    05/10/22 0405 05/11/22 0428  NA 134* 139  K 3.7 4.2  CL 104 107  CO2 20* 25  GLUCOSE 119* 97  BUN 12 14  CREATININE 0.73 0.76  CALCIUM 7.9* 8.1*   PT/INR Recent Labs    05/09/22 1622  LABPROT 13.3  INR 1.0    Studies/Results: DG Chest Port 1 View  Result Date: 05/10/2022 CLINICAL DATA:  [id number] with ventilator dependent respiratory failure. EXAM: PORTABLE CHEST 1 VIEW COMPARISON:  Portable chest yesterday at 10:59 p.m. FINDINGS: Stable endotracheal positioning with ETT tip in the mid thoracic trachea. NGT has been advanced. The side-hole is no longer at the hiatus with the full intragastric course of the tube is not filmed. Neither the side-hole or tip of the tube are in the field. There is increased gastric air distention. Multiple overlying monitor wires. There is a stable mediastinum with aortic tortuosity and calcification, normal cardiac size. The lungs are slightly emphysematous but clear. No pleural effusion is seen. Osteopenia.  IMPRESSION: 1. NGT has been advanced. The side-hole is no longer at the hiatus but the full intragastric course of the tube not filmed. 2. Increased gastric air distention. 3. No other change. Electronically Signed   By: Telford Nab M.D.   On: 05/10/2022 06:51   DG Chest Port 1 View  Result Date: 05/09/2022 CLINICAL DATA:  Check endotracheal tube placement EXAM: PORTABLE CHEST 1 VIEW COMPARISON:  01/13/2012 FINDINGS: Cardiac shadow is within normal limits. Endotracheal tube is noted in satisfactory position 3 cm above the carina. Gastric catheter shows the tip in the stomach although the proximal side port is seen in the distal esophagus. This should be advanced several cm deeper into the stomach. The lungs are clear. No bony abnormality is noted. IMPRESSION: Endotracheal tube in satisfactory position. Gastric catheter should be advanced deeper into the stomach. No acute abnormality noted. Electronically Signed   By: Inez Catalina M.D.   On: 05/09/2022 23:15   CT ABDOMEN PELVIS W CONTRAST  Result Date: 05/09/2022 CLINICAL DATA:  Acute abdominal pain beginning 3 days ago. Diarrhea and chills. EXAM: CT ABDOMEN AND PELVIS WITH CONTRAST TECHNIQUE: Multidetector CT imaging of the abdomen and pelvis was performed using the standard protocol following bolus administration of intravenous contrast. RADIATION DOSE REDUCTION: This exam was performed according to the departmental dose-optimization program which includes automated exposure control, adjustment of the mA and/or kV according to patient size and/or use of iterative reconstruction technique. CONTRAST:  154m OMNIPAQUE IOHEXOL 300 MG/ML  SOLN COMPARISON:  CT of the abdomen and pelvis 01/01/2022 FINDINGS: Lower chest: Mild dependent atelectasis is present. Lungs are otherwise clear. Heart size is normal. No significant pleural or pericardial effusion is present. Hepatobiliary: Liver is unremarkable. Layering gallstones are present without focal inflammatory  changes about the gallbladder. The common bile duct is within normal limits. Pancreas: Unremarkable. No pancreatic ductal dilatation or surrounding inflammatory changes. Spleen: Normal in size without focal abnormality. Adrenals/Urinary Tract: Adrenal glands are unremarkable. Kidneys are normal, without renal calculi, focal lesion, or hydronephrosis. Bladder is unremarkable. Stomach/Bowel: The stomach and duodenum are within normal limits. Multiple dilated inflamed loops of bowel are present within the anatomic pelvis. No proximal obstruction is present. Diffuse inflammatory changes are present throughout the colon. Pneumoperitoneum is present. Locules of gas are noted anteriorly along the peritoneum and into the porta hepatis. A developing fluid collection is present within the anatomic pelvis adjacent to the inflamed colon and irregular calcification is present adjacent to the collection. This may be a nidus for the infection. Additional free fluid is seen dependently within the anatomic pelvis. Vascular/Lymphatic: No significant vascular findings are present. No enlarged abdominal or pelvic lymph nodes. Reproductive: Status post hysterectomy. No adnexal masses. Musculoskeletal: Vertebral body heights and alignment are normal. No acute or focal osseous abnormalities are present. Hips are located and within normal limits bilaterally. IMPRESSION: 1. Hollow viscus perforation likely secondary to diffuse inflammatory colitis. 2. Developing fluid collection with gas in the central pelvis. 3. Adjacent 2 cm calcification which may be related to the nidus of infection/inflammation. 4. Multiple inflamed loops of small bowel without obstruction. This is likely secondary to the primary infection and extraluminal collection. 5. Additional free fluid within the anatomic pelvis. These results were called by telephone at the time of interpretation on 05/09/2022 at 6:38 pm to provider Dr. Rogene Houston , who verbally acknowledged  these results. Electronically Signed   By: San Morelle M.D.   On: 05/09/2022 18:46    Anti-infectives: Anti-infectives (From admission, onward)    Start     Dose/Rate Route Frequency Ordered Stop   05/11/22 2200  piperacillin-tazobactam (ZOSYN) IVPB 3.375 g        3.375 g 12.5 mL/hr over 240 Minutes Intravenous Every 8 hours 05/11/22 1354 05/16/22 2159   05/11/22 1445  piperacillin-tazobactam (ZOSYN) IVPB 3.375 g        3.375 g 100 mL/hr over 30 Minutes Intravenous  Once 05/11/22 1353     05/10/22 1000  cefoTEtan (CEFOTAN) 2 g in sodium chloride 0.9 % 100 mL IVPB  Status:  Discontinued        2 g 200 mL/hr over 30 Minutes Intravenous Every 12 hours 05/09/22 2241 05/11/22 1353   05/10/22 0600  cefoTEtan (CEFOTAN) 2 g in sodium chloride 0.9 % 100 mL IVPB        2 g 200 mL/hr over 30 Minutes Intravenous On call to O.R. 05/09/22 2112 05/09/22 2132   05/09/22 2024  sodium chloride 0.9 % with cefoTEtan (CEFOTAN) ADS Med       Note to Pharmacy: Leeann Must M: cabinet override      05/09/22 2024 05/09/22 2113   05/09/22 1900  piperacillin-tazobactam (ZOSYN) IVPB 3.375 g        3.375 g 100 mL/hr over 30 Minutes Intravenous  Once 05/09/22 1847 05/09/22 2013       Assessment/Plan: Patient s/p colectomy colostomy for perforation.  PRN For pain IS, OOB HD ok NG, NPO Awaiting bowel function Colostomy RN  Wound packing Synthroid  ordered 15mg Labs tomorrow Lytes replaced SCDs, heparin Updated Rena    LOS: 2 days    LVirl Cagey1/30/2024

## 2022-05-12 LAB — CBC WITH DIFFERENTIAL/PLATELET
Abs Immature Granulocytes: 0.25 10*3/uL — ABNORMAL HIGH (ref 0.00–0.07)
Basophils Absolute: 0 10*3/uL (ref 0.0–0.1)
Basophils Relative: 0 %
Eosinophils Absolute: 0.1 10*3/uL (ref 0.0–0.5)
Eosinophils Relative: 0 %
HCT: 32.2 % — ABNORMAL LOW (ref 36.0–46.0)
Hemoglobin: 10.4 g/dL — ABNORMAL LOW (ref 12.0–15.0)
Immature Granulocytes: 2 %
Lymphocytes Relative: 6 %
Lymphs Abs: 0.8 10*3/uL (ref 0.7–4.0)
MCH: 29.4 pg (ref 26.0–34.0)
MCHC: 32.3 g/dL (ref 30.0–36.0)
MCV: 91 fL (ref 80.0–100.0)
Monocytes Absolute: 0.7 10*3/uL (ref 0.1–1.0)
Monocytes Relative: 5 %
Neutro Abs: 11.6 10*3/uL — ABNORMAL HIGH (ref 1.7–7.7)
Neutrophils Relative %: 87 %
Platelets: 246 10*3/uL (ref 150–400)
RBC: 3.54 MIL/uL — ABNORMAL LOW (ref 3.87–5.11)
RDW: 13.2 % (ref 11.5–15.5)
WBC: 13.5 10*3/uL — ABNORMAL HIGH (ref 4.0–10.5)
nRBC: 0 % (ref 0.0–0.2)

## 2022-05-12 LAB — BASIC METABOLIC PANEL
Anion gap: 10 (ref 5–15)
BUN: 13 mg/dL (ref 8–23)
CO2: 23 mmol/L (ref 22–32)
Calcium: 8 mg/dL — ABNORMAL LOW (ref 8.9–10.3)
Chloride: 104 mmol/L (ref 98–111)
Creatinine, Ser: 0.72 mg/dL (ref 0.44–1.00)
GFR, Estimated: >60 mL/min (ref >60)
Glucose, Bld: 102 mg/dL — ABNORMAL HIGH (ref 70–99)
Potassium: 3.2 mmol/L — ABNORMAL LOW (ref 3.5–5.1)
Sodium: 137 mmol/L (ref 135–145)

## 2022-05-12 LAB — PHOSPHORUS: Phosphorus: 2.9 mg/dL (ref 2.5–4.6)

## 2022-05-12 LAB — MAGNESIUM: Magnesium: 2 mg/dL (ref 1.7–2.4)

## 2022-05-12 LAB — GLUCOSE, CAPILLARY
Glucose-Capillary: 101 mg/dL — ABNORMAL HIGH (ref 70–99)
Glucose-Capillary: 102 mg/dL — ABNORMAL HIGH (ref 70–99)
Glucose-Capillary: 107 mg/dL — ABNORMAL HIGH (ref 70–99)
Glucose-Capillary: 110 mg/dL — ABNORMAL HIGH (ref 70–99)
Glucose-Capillary: 115 mg/dL — ABNORMAL HIGH (ref 70–99)
Glucose-Capillary: 88 mg/dL (ref 70–99)
Glucose-Capillary: 92 mg/dL (ref 70–99)

## 2022-05-12 LAB — SURGICAL PATHOLOGY

## 2022-05-12 MED ORDER — PHENOL 1.4 % MT LIQD
1.0000 | OROMUCOSAL | Status: DC | PRN
  Administered 2022-05-12 – 2022-05-13 (×4): 1 via OROMUCOSAL
  Filled 2022-05-12: qty 177

## 2022-05-12 MED ORDER — POTASSIUM CHLORIDE 10 MEQ/100ML IV SOLN
10.0000 meq | Freq: Once | INTRAVENOUS | Status: AC
  Administered 2022-05-12: 10 meq via INTRAVENOUS

## 2022-05-12 MED ORDER — POTASSIUM CHLORIDE 10 MEQ/100ML IV SOLN
10.0000 meq | INTRAVENOUS | Status: AC
  Administered 2022-05-12 (×3): 10 meq via INTRAVENOUS
  Filled 2022-05-12 (×4): qty 100

## 2022-05-12 NOTE — Consult Note (Signed)
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  Patient and niece educated on emptying pouch when 1/3 to 1/2 full.  Niece demonstrated how to empty pouch utilizing lock and roll mechanism, cleaning spout with wick and closing lock and roll mechanism.  Educated niece on removing pouch from top to bottom utilizing push and pull method, how to clean stoma with water moistened washcloth and that it is normal for stoma to bleed slightly with cleaning.  Demonstrated how to measure stoma and niece was able to cut pouch to  1 1/4".  Demonstrated placing skin barrier ring around stoma for protection of peristomal skin.  Niece was able to place skin barrier onto skin barrier ring and attach pouch independently.   We discussed today that ideally pouching system should be changed twice a week but if skin barrier leaks the importance of changing promptly so skin does not become damaged.  We discussed showering with bag on and off.   Patient does have questions about utilizing medications at home for constipation, I told her she could still become constipated with the colostomy and she should discuss use of medications with surgeon.  Patient and niece both felt encouraged after visit and niece feels she could do this independently with one more education session.    Enrolled patient in [place] program: Yes  WOC will follow this patient for further support and education of ostomy.   Thank you,    Milicent Acheampong MSN, RN-BC,  [employer]

## 2022-05-12 NOTE — Evaluation (Signed)
Physical Therapy Evaluation Patient Details Name: [name] MRN: [id number] DOB: [birth year] Today's Date: 05/12/2022  History of Present Illness  Yvette Branch is a 77 yo who has GERD, some chronic constipation, prior bowel obstruction with abscess who comes in with worsening abdominal pain over the past 3 days and some nausea and no vomiting. She has had some blood in her stools with wiping.  She has had some issues with urine hesitancy.   Clinical Impression  Patient presents seated in chair. Patient is alert, awake and cooperative. Patient able to transfer Mod I and stand without AD. Patient does require a little help positioning around chair and in room due to nasal tube, colostomy bag and IV lines. Patient able to ambulate in hallway with supervision/ SBA using no AD. She demos very good activity tolerance overall. Patient was returned to her room and assisted to bed side commode for toiletting, positioned at bedside. Patient left in this position, per patient request. Nursing staff notified. Patient with phone and call bell in reach. Patient will benefit from continued physical therapy in hospital and recommended venue below to increase strength, balance, endurance for safe ADLs and gait.         Recommendations for follow up therapy are one component of a multi-disciplinary discharge planning process, led by the attending physician.  Recommendations may be updated based on patient status, additional functional criteria and insurance authorization.  Follow Up Recommendations Other (comment) (Pending need for medication and surgical equipment managment, reccomend SNF. Otherwise patient functioning well enough for home with intermittent suprvision)      Assistance Recommended at Discharge Intermittent Supervision/Assistance  Patient can return home with the following  A little help with walking and/or transfers;A little help with bathing/dressing/bathroom;Help with stairs or ramp for  entrance    Equipment Recommendations None recommended by PT  Recommendations for Other Services  Other (comment)    Functional Status Assessment Patient has had a recent decline in their functional status and demonstrates the ability to make significant improvements in function in a reasonable and predictable amount of time.     Precautions / Restrictions Precautions Precautions: None Restrictions Weight Bearing Restrictions: No      Mobility  Bed Mobility Overal bed mobility: Modified Independent                  Transfers Overall transfer level: Modified independent Equipment used: None                    Ambulation/Gait Ambulation/Gait assistance: Supervision Gait Distance (Feet): 400 Feet Assistive device: None Gait Pattern/deviations: Decreased stride length Gait velocity: slow        Stairs            Wheelchair Mobility    Modified Rankin (Stroke Patients Only)       Balance Overall balance assessment: Independent                                           Pertinent Vitals/Pain Pain Assessment Pain Assessment: No/denies pain    Home Living Family/patient expects to be discharged to:: Private residence Living Arrangements: Alone Available Help at Discharge: Friend(s);Available PRN/intermittently Type of Home: House Home Access: Level entry       Home Layout: One level        Prior Function Prior Level of Function : Independent/Modified Independent  Hand Dominance        Extremity/Trunk Assessment   Upper Extremity Assessment Upper Extremity Assessment: Overall WFL for tasks assessed    Lower Extremity Assessment Lower Extremity Assessment: Overall WFL for tasks assessed    Cervical / Trunk Assessment Cervical / Trunk Assessment: Normal  Communication   Communication: No difficulties  Cognition Arousal/Alertness: Awake/alert Behavior During Therapy: WFL for  tasks assessed/performed Overall Cognitive Status: Within Functional Limits for tasks assessed                                          General Comments      Exercises     Assessment/Plan    PT Assessment Patient needs continued PT services  PT Problem List Decreased strength;Decreased mobility       PT Treatment Interventions Gait training;Neuromuscular re-education;Functional mobility training;Therapeutic exercise;Patient/family education;Therapeutic activities    PT Goals (Current goals can be found in the Care Plan section)  Acute Rehab PT Goals Patient Stated Goal: Return home PT Goal Formulation: With patient Time For Goal Achievement: 05/26/22 Potential to Achieve Goals: Good    Frequency Min 2X/week     Co-evaluation               AM-PAC PT "6 Clicks" Mobility  Outcome Measure Help needed turning from your back to your side while in a flat bed without using bedrails?: None Help needed moving from lying on your back to sitting on the side of a flat bed without using bedrails?: None Help needed moving to and from a bed to a chair (including a wheelchair)?: None Help needed standing up from a chair using your arms (e.g., wheelchair or bedside chair)?: None Help needed to walk in hospital room?: A Little Help needed climbing 3-5 steps with a railing? : A Little 6 Click Score: 22    End of Session Equipment Utilized During Treatment: Gait belt Activity Tolerance: Patient tolerated treatment well Patient left: in chair;with call bell/phone within reach Nurse Communication: Mobility status PT Visit Diagnosis: Muscle weakness (generalized) (M62.81)    Time: 1340-1410 PT Time Calculation (min) (ACUTE ONLY): 30 min   Charges:   PT Evaluation $PT Eval Low Complexity: 1 Low PT Treatments $Gait Training: 8-22 mins      4:20 PM, 05/12/22 Josue Hector PT DPT  Physical Therapist with [facility]  [phone]

## 2022-05-12 NOTE — Plan of Care (Signed)
  Problem: Acute Rehab PT Goals(only PT should resolve) Goal: Pt Will Ambulate Flowsheets (Taken 05/12/2022 1619) Pt will Ambulate:  > 125 feet  with modified independence Goal: Pt Will Go Up/Down Stairs Flowsheets (Taken 05/12/2022 1619) Pt will Go Up / Down Stairs:  1-2 stairs  with modified independence Goal: Pt/caregiver will Perform Home Exercise Program Flowsheets (Taken 05/12/2022 1619) Pt/caregiver will Perform Home Exercise Program:  For increased strengthening  With minimal assist   4:20 PM, 05/12/22 Josue Hector PT DPT  Physical Therapist with [facility]  [phone]

## 2022-05-12 NOTE — Progress Notes (Signed)
[facility] Progress Note  3 Days Post-Op  Subjective: Doing well, working with ostomy RN today. Walking.   Objective: Vital signs in last 24 hours: Temp:  [97.6 F (36.4 C)-98.7 F (37.1 C)] 97.6 F (36.4 C) (01/31 1315) Pulse Rate:  [69-73] 70 (01/31 1315) Resp:  [16-18] 18 (01/31 0521) BP: (114-128)/(59-72) 128/60 (01/31 1315) SpO2:  [98 %-100 %] 100 % (01/31 1315)    Intake/Output from previous day: 01/30 0701 - 01/31 0700 In: 1329.3 [I.V.:1064; IV Piggyback:265.3] Out: 350 [Urine:350] Intake/Output this shift: No intake/output data recorded.  General appearance: alert and no distress Resp: normal work of breathin GI: midline changed, minor drainage, no erythema, ostomy some edema, no gas in bag  Lab Results:  Recent Labs    05/11/22 0428 05/12/22 0411  WBC 10.7* 13.5*  HGB 10.1* 10.4*  HCT 32.2* 32.2*  PLT 201 246   BMET Recent Labs    05/11/22 0428 05/12/22 0411  NA 139 137  K 4.2 3.2*  CL 107 104  CO2 25 23  GLUCOSE 97 102*  BUN 14 13  CREATININE 0.76 0.72  CALCIUM 8.1* 8.0*   PT/INR Recent Labs    05/09/22 1622  LABPROT 13.3  INR 1.0    Studies/Results: No results found.  Anti-infectives: Anti-infectives (From admission, onward)    Start     Dose/Rate Route Frequency Ordered Stop   05/11/22 2200  piperacillin-tazobactam (ZOSYN) IVPB 3.375 g        3.375 g 12.5 mL/hr over 240 Minutes Intravenous Every 8 hours 05/11/22 1354 05/16/22 2159   05/11/22 1445  piperacillin-tazobactam (ZOSYN) IVPB 3.375 g        3.375 g 100 mL/hr over 30 Minutes Intravenous  Once 05/11/22 1353 05/11/22 1528   05/10/22 1000  cefoTEtan (CEFOTAN) 2 g in sodium chloride 0.9 % 100 mL IVPB  Status:  Discontinued        2 g 200 mL/hr over 30 Minutes Intravenous Every 12 hours 05/09/22 2241 05/11/22 1353   05/10/22 0600  cefoTEtan (CEFOTAN) 2 g in sodium chloride 0.9 % 100 mL IVPB        2 g 200 mL/hr over 30 Minutes Intravenous On call to O.R.  05/09/22 2112 05/09/22 2132   05/09/22 2024  sodium chloride 0.9 % with cefoTEtan (CEFOTAN) ADS Med       Note to Pharmacy: Leeann Must M: cabinet override      05/09/22 2024 05/09/22 2113   05/09/22 1900  piperacillin-tazobactam (ZOSYN) IVPB 3.375 g        3.375 g 100 mL/hr over 30 Minutes Intravenous  Once 05/09/22 1847 05/09/22 2013       Assessment/Plan: Patient s/p colectomy colostomy for perforation.  PRN For pain IS, OOB HD ok, off telemetry  NG, NPO Awaiting bowel function Colostomy RN  Wound packing Synthroid ordered 65mg Labs tomorrow Lytes replaced SCDs, heparin PT today to get her walking   LOS: 3 days    LVirl Cagey1/31/2024

## 2022-05-12 NOTE — Progress Notes (Signed)
Patient slept on and off this shift due to urgency with urination. No request for pain medication this shift. Continued to monitor patient.

## 2022-05-13 ENCOUNTER — Encounter (HOSPITAL_COMMUNITY): Payer: Self-pay | Admitting: General Surgery

## 2022-05-13 LAB — CBC
HCT: 29.2 % — ABNORMAL LOW (ref 36.0–46.0)
Hemoglobin: 9.9 g/dL — ABNORMAL LOW (ref 12.0–15.0)
MCH: 29.9 pg (ref 26.0–34.0)
MCHC: 33.9 g/dL (ref 30.0–36.0)
MCV: 88.2 fL (ref 80.0–100.0)
Platelets: 279 10*3/uL (ref 150–400)
RBC: 3.31 MIL/uL — ABNORMAL LOW (ref 3.87–5.11)
RDW: 13.3 % (ref 11.5–15.5)
WBC: 11.4 10*3/uL — ABNORMAL HIGH (ref 4.0–10.5)
nRBC: 0 % (ref 0.0–0.2)

## 2022-05-13 LAB — GLUCOSE, CAPILLARY
Glucose-Capillary: 100 mg/dL — ABNORMAL HIGH (ref 70–99)
Glucose-Capillary: 101 mg/dL — ABNORMAL HIGH (ref 70–99)
Glucose-Capillary: 102 mg/dL — ABNORMAL HIGH (ref 70–99)
Glucose-Capillary: 105 mg/dL — ABNORMAL HIGH (ref 70–99)
Glucose-Capillary: 109 mg/dL — ABNORMAL HIGH (ref 70–99)
Glucose-Capillary: 111 mg/dL — ABNORMAL HIGH (ref 70–99)

## 2022-05-13 LAB — AEROBIC/ANAEROBIC CULTURE W GRAM STAIN (SURGICAL/DEEP WOUND)

## 2022-05-13 LAB — BASIC METABOLIC PANEL
Anion gap: 8 (ref 5–15)
BUN: 10 mg/dL (ref 8–23)
CO2: 25 mmol/L (ref 22–32)
Calcium: 7.7 mg/dL — ABNORMAL LOW (ref 8.9–10.3)
Chloride: 102 mmol/L (ref 98–111)
Creatinine, Ser: 0.68 mg/dL (ref 0.44–1.00)
GFR, Estimated: >60 mL/min (ref >60)
Glucose, Bld: 117 mg/dL — ABNORMAL HIGH (ref 70–99)
Potassium: 3.5 mmol/L (ref 3.5–5.1)
Sodium: 135 mmol/L (ref 135–145)

## 2022-05-13 MED ORDER — POTASSIUM CHLORIDE 20 MEQ PO PACK
60.0000 meq | PACK | Freq: Once | ORAL | Status: AC
  Administered 2022-05-13: 60 meq via ORAL
  Filled 2022-05-13: qty 3

## 2022-05-13 NOTE — Progress Notes (Signed)
[facility] Progress Note  4 Days Post-Op  Subjective: Having some colostomy output.   Objective: Vital signs in last 24 hours: Temp:  [97.6 F (36.4 C)-98.5 F (36.9 C)] 98.2 F (36.8 C) (02/01 0431) Pulse Rate:  [68-72] 68 (02/01 0431) Resp:  [18] 18 (02/01 0431) BP: (121-136)/(55-64) 136/55 (02/01 0431) SpO2:  [96 %-100 %] 96 % (02/01 0431)    Intake/Output from previous day: 01/31 0701 - 02/01 0700 In: 367 [IV Piggyback:367] Out: 700 [Emesis/NG output:700] Intake/Output this shift: No intake/output data recorded.  General appearance: alert and no distress Resp: normal work of breathing GI: soft, nondistended, ostomy with liquid in bag  Lab Results:  Recent Labs    05/12/22 0411 05/13/22 0409  WBC 13.5* 11.4*  HGB 10.4* 9.9*  HCT 32.2* 29.2*  PLT 246 279   BMET Recent Labs    05/12/22 0411 05/13/22 0409  NA 137 135  K 3.2* 3.5  CL 104 102  CO2 23 25  GLUCOSE 102* 117*  BUN 13 10  CREATININE 0.72 0.68  CALCIUM 8.0* 7.7*   PT/INR No results for input(s): "LABPROT", "INR" in the last 72 hours.  Studies/Results: No results found.  Anti-infectives: Anti-infectives (From admission, onward)    Start     Dose/Rate Route Frequency Ordered Stop   05/11/22 2200  piperacillin-tazobactam (ZOSYN) IVPB 3.375 g        3.375 g 12.5 mL/hr over 240 Minutes Intravenous Every 8 hours 05/11/22 1354 05/16/22 2159   05/11/22 1445  piperacillin-tazobactam (ZOSYN) IVPB 3.375 g        3.375 g 100 mL/hr over 30 Minutes Intravenous  Once 05/11/22 1353 05/11/22 1528   05/10/22 1000  cefoTEtan (CEFOTAN) 2 g in sodium chloride 0.9 % 100 mL IVPB  Status:  Discontinued        2 g 200 mL/hr over 30 Minutes Intravenous Every 12 hours 05/09/22 2241 05/11/22 1353   05/10/22 0600  cefoTEtan (CEFOTAN) 2 g in sodium chloride 0.9 % 100 mL IVPB        2 g 200 mL/hr over 30 Minutes Intravenous On call to O.R. 05/09/22 2112 05/09/22 2132   05/09/22 2024  sodium  chloride 0.9 % with cefoTEtan (CEFOTAN) ADS Med       Note to Pharmacy: Leeann Must M: cabinet override      05/09/22 2024 05/09/22 2113   05/09/22 1900  piperacillin-tazobactam (ZOSYN) IVPB 3.375 g        3.375 g 100 mL/hr over 30 Minutes Intravenous  Once 05/09/22 1847 05/09/22 2013       Assessment/Plan: Patient s/p colectomy colostomy for perforation.  PRN For pain IS, OOB HD ok Ng out, clears Colostomy RN  Wound packing Synthroid ordered 32mg Labs tomorrow, k replaced SCDs, heparin Walking   LOS: 4 days    LVirl Cagey2/04/2022 '

## 2022-05-13 NOTE — Progress Notes (Signed)
NG tube removed per MD order, pt tolerated well.

## 2022-05-13 NOTE — Progress Notes (Signed)
Patient slept on and off this shift. No complain of pain, just irritation to throat phenol spray given. Wound dressing changed this shift. No output , or gas from Colostomy. Continued to monitor.

## 2022-05-14 LAB — CBC
HCT: 34.8 % — ABNORMAL LOW (ref 36.0–46.0)
Hemoglobin: 11.3 g/dL — ABNORMAL LOW (ref 12.0–15.0)
MCH: 29.1 pg (ref 26.0–34.0)
MCHC: 32.5 g/dL (ref 30.0–36.0)
MCV: 89.7 fL (ref 80.0–100.0)
Platelets: 354 10*3/uL (ref 150–400)
RBC: 3.88 MIL/uL (ref 3.87–5.11)
RDW: 13.4 % (ref 11.5–15.5)
WBC: 12.4 10*3/uL — ABNORMAL HIGH (ref 4.0–10.5)
nRBC: 0 % (ref 0.0–0.2)

## 2022-05-14 LAB — GLUCOSE, CAPILLARY
Glucose-Capillary: 104 mg/dL — ABNORMAL HIGH (ref 70–99)
Glucose-Capillary: 127 mg/dL — ABNORMAL HIGH (ref 70–99)
Glucose-Capillary: 135 mg/dL — ABNORMAL HIGH (ref 70–99)
Glucose-Capillary: 138 mg/dL — ABNORMAL HIGH (ref 70–99)
Glucose-Capillary: 95 mg/dL (ref 70–99)
Glucose-Capillary: 96 mg/dL (ref 70–99)

## 2022-05-14 LAB — BASIC METABOLIC PANEL
Anion gap: 5 (ref 5–15)
BUN: 8 mg/dL (ref 8–23)
CO2: 26 mmol/L (ref 22–32)
Calcium: 8.2 mg/dL — ABNORMAL LOW (ref 8.9–10.3)
Chloride: 103 mmol/L (ref 98–111)
Creatinine, Ser: 0.74 mg/dL (ref 0.44–1.00)
GFR, Estimated: >60 mL/min (ref >60)
Glucose, Bld: 98 mg/dL (ref 70–99)
Potassium: 3.7 mmol/L (ref 3.5–5.1)
Sodium: 134 mmol/L — ABNORMAL LOW (ref 135–145)

## 2022-05-14 NOTE — Progress Notes (Signed)
[facility] Progress Note  5 Days Post-Op  Subjective: Doing well and eating and having ostomy output that is bile sweat.   Objective: Vital signs in last 24 hours: Temp:  [97.3 F (36.3 C)-98.2 F (36.8 C)] 97.3 F (36.3 C) (02/02 0521) Pulse Rate:  [58-64] 63 (02/02 1125) Resp:  [16-18] 16 (02/02 1125) BP: (115-133)/(58-59) 115/59 (02/02 1125) SpO2:  [97 %] 97 % (02/02 1125) Last BM Date : 05/14/22  Intake/Output from previous day: 02/01 0701 - 02/02 0700 In: 240 [P.O.:240] Out: 425 [Stool:425] Intake/Output this shift: Total I/O In: 120 [P.O.:120] Out: 200 [Stool:200]  General appearance: alert and no distress Resp: normal work of breathing GI: soft, appropriately tender,  ostomy edematous and bile sweat, dressing in place  Lab Results:  Recent Labs    05/13/22 0409 05/14/22 0418  WBC 11.4* 12.4*  HGB 9.9* 11.3*  HCT 29.2* 34.8*  PLT 279 354   BMET Recent Labs    05/13/22 0409 05/14/22 0418  NA 135 134*  K 3.5 3.7  CL 102 103  CO2 25 26  GLUCOSE 117* 98  BUN 10 8  CREATININE 0.68 0.74  CALCIUM 7.7* 8.2*   PT/INR No results for input(s): "LABPROT", "INR" in the last 72 hours.  Studies/Results: No results found.  Anti-infectives: Anti-infectives (From admission, onward)    Start     Dose/Rate Route Frequency Ordered Stop   05/11/22 2200  piperacillin-tazobactam (ZOSYN) IVPB 3.375 g        3.375 g 12.5 mL/hr over 240 Minutes Intravenous Every 8 hours 05/11/22 1354 05/16/22 2159   05/11/22 1445  piperacillin-tazobactam (ZOSYN) IVPB 3.375 g        3.375 g 100 mL/hr over 30 Minutes Intravenous  Once 05/11/22 1353 05/11/22 1528   05/10/22 1000  cefoTEtan (CEFOTAN) 2 g in sodium chloride 0.9 % 100 mL IVPB  Status:  Discontinued        2 g 200 mL/hr over 30 Minutes Intravenous Every 12 hours 05/09/22 2241 05/11/22 1353   05/10/22 0600  cefoTEtan (CEFOTAN) 2 g in sodium chloride 0.9 % 100 mL IVPB        2 g 200 mL/hr over 30  Minutes Intravenous On call to O.R. 05/09/22 2112 05/09/22 2132   05/09/22 2024  sodium chloride 0.9 % with cefoTEtan (CEFOTAN) ADS Med       Note to Pharmacy: Leeann Must M: cabinet override      05/09/22 2024 05/09/22 2113   05/09/22 1900  piperacillin-tazobactam (ZOSYN) IVPB 3.375 g        3.375 g 100 mL/hr over 30 Minutes Intravenous  Once 05/09/22 1847 05/09/22 2013       Assessment/Plan: Patient s/p colectomy colostomy for perforation.  PRN For pain IS, OOB HD ok Soft diet Colostomy RN  Wound packing Synthroid ordered 22mg Labs tomorrow, zosyn post op   SCDs, heparin Walking    LOS: 5 days    LVirl Cagey2/05/2022

## 2022-05-14 NOTE — Progress Notes (Signed)
Patient slept on and off this shift, she is self transferring from bed to bedside commode without any complaints of pain. Dressing changed this AM, pink in color with no drainage. Continued to monitor patient.

## 2022-05-14 NOTE — TOC Initial Note (Addendum)
Transition of Care [facility]) - Initial/Assessment Note    Patient Details  Name: Yvette Branch MRN: [id number] Date of Birth: 01/25/1946  Transition of Care [facility]) CM/SW Contact:    Iona Beard, [place] Phone Number: 05/14/2022, 11:05 AM  Clinical Narrative:                 CSW updated that pt is recommended for [facility] at D/C. CSW spoke with pt in room who states that she is agreeable to [facility] services. Pt will need HH RN/PT/Aide services at D/C. Pt states that she will D/C to her nieces home. Pt states that she would like a bedside commode ordered also. Pt does not have agency preferences. CSW spoke with pts niece who confirms her address is [address] [place], [place] [address] and that pt will come to her home at D/C. Pts niece Yvette Branch is also best contact for [facility] agency. Cory with [facility] accepted [facility] referral. DME referral accepted by [employer]. TOC to follow.   Expected Discharge Plan: [place] Barriers to Discharge: Continued Medical Work up   Patient Goals and CMS Choice Patient states their goals for this hospitalization and ongoing recovery are:: return home with niece CMS Medicare.gov Compare Post Acute Care list provided to:: Patient Choice offered to / list presented to : Patient      Expected Discharge Plan and Services In-house Referral: Clinical Social Work Discharge Planning Services: CM Consult Post Acute Care Choice: Home Health, Durable Medical Equipment Living arrangements for the past 2 months: Single Family Home                 DME Arranged: Bedside commode         HH Arranged: RN, PT, Nurse's Aide          Prior Living Arrangements/Services Living arrangements for the past 2 months: Single Family Home Lives with:: Self Patient language and need for interpreter reviewed:: Yes Do you feel safe going back to the place where you live?: Yes      Need for Family Participation in Patient Care: Yes (Comment) Care giver support system in place?: Yes  (comment)   Criminal Activity/Legal Involvement Pertinent to Current Situation/Hospitalization: No - Comment as needed  Activities of Daily Living Home Assistive Devices/Equipment: None ADL Screening (condition at time of admission) Patient's cognitive ability adequate to safely complete daily activities?: Yes Is the patient deaf or have difficulty hearing?: No Does the patient have difficulty seeing, even when wearing glasses/contacts?: No Does the patient have difficulty concentrating, remembering, or making decisions?: No Patient able to express need for assistance with ADLs?: Yes Does the patient have difficulty dressing or bathing?: No Independently performs ADLs?: Yes (appropriate for developmental age) Does the patient have difficulty walking or climbing stairs?: No Weakness of Legs: None Weakness of Arms/Hands: None  Permission Sought/Granted                  Emotional Assessment Appearance:: Appears stated age Attitude/Demeanor/Rapport: Engaged Affect (typically observed): Accepting Orientation: : Oriented to Self, Oriented to  Time, Oriented to Place, Oriented to Situation Alcohol / Substance Use: Not Applicable Psych Involvement: No (comment)  Admission diagnosis:  Colitis [K52.9] Perforated abdominal viscus [R19.8] Perforated bowel ([place]) [K63.1] Patient Active Problem List   Diagnosis Date Noted   Perforated abdominal viscus 05/09/2022   Colitis 05/09/2022   Perforation of sigmoid colon ([place]) 05/09/2022   Gallstones 01/12/2022   Constipation 11/28/2018   History of diverticulitis 11/28/2018  Anal skin tag 11/28/2018   Candidiasis of vulva and vagina 76/19/5093   Lichen sclerosus et atrophicus 07/03/2013   Vaginitis and vulvovaginitis, unspecified 06/06/2013   Hypoglycemia 02/12/2013   SBO (small bowel obstruction) ([place]) 02/09/2013   Hypercalcemia 02/09/2013   Anxiety    GERD (gastroesophageal reflux disease)    Hypokalemia 01/11/2012   Malnutrition,  calorie 01/11/2012   PCP:  Sharilyn Sites, MD Pharmacy:   [place], [place] [address] Phone: [phone] Fax: [phone]     Social Determinants of Health (SDOH) Social History: SDOH Screenings   Food Insecurity: No Food Insecurity (05/13/2022)  Housing: Low Risk  (05/13/2022)  Transportation Needs: No Transportation Needs (05/13/2022)  Utilities: Not At Risk (05/13/2022)  Tobacco Use: Low Risk  (05/13/2022)   SDOH Interventions:     Readmission Risk Interventions     No data to display

## 2022-05-14 NOTE — Progress Notes (Signed)
Physical Therapy Treatment Patient Details Name: Yvette Branch MRN: [id number] DOB: [birth year] Today's Date: 05/14/2022   History of Present Illness Yvette Branch is a 77 yo who has GERD, some chronic constipation, prior bowel obstruction with abscess who comes in with worsening abdominal pain over the past 3 days and some nausea and no vomiting. She has had some blood in her stools with wiping.  She has had some issues with urine hesitancy.    PT Comments    Patient demonstrates good return for bed mobility, transfers and walking in hallway without AD and pushing IV pole.  Patient encouraged to ambulate daily as tolerated for length of stay.  Plan:  Patient discharged from physical therapy to care of nursing for ambulation daily as tolerated for length of stay.     Recommendations for follow up therapy are one component of a multi-disciplinary discharge planning process, led by the attending physician.  Recommendations may be updated based on patient status, additional functional criteria and insurance authorization.  Follow Up Recommendations  Home health PT     Assistance Recommended at Discharge Set up Supervision/Assistance  Patient can return home with the following A little help with walking and/or transfers;A little help with bathing/dressing/bathroom;Help with stairs or ramp for entrance   Equipment Recommendations  None recommended by PT    Recommendations for Other Services       Precautions / Restrictions Precautions Precautions: None Restrictions Weight Bearing Restrictions: No     Mobility  Bed Mobility Overal bed mobility: Modified Independent                  Transfers Overall transfer level: Modified independent                      Ambulation/Gait Ambulation/Gait assistance: Modified independent (Device/Increase time) Gait Distance (Feet): 400 Feet Assistive device: IV Pole, None Gait Pattern/deviations: WFL(Within Functional Limits) Gait  velocity: decreased     General Gait Details: grossly WFL other than requiring frequent verbal cues to left arms swing with fair/good carryover, no loss of balance and fair/good return for pushing IV pole when walking   Stairs             Wheelchair Mobility    Modified Rankin (Stroke Patients Only)       Balance Overall balance assessment: Independent                                          Cognition Arousal/Alertness: Awake/alert Behavior During Therapy: WFL for tasks assessed/performed Overall Cognitive Status: Within Functional Limits for tasks assessed                                          Exercises      General Comments        Pertinent Vitals/Pain Pain Assessment Pain Assessment: No/denies pain    Home Living                          Prior Function            PT Goals (current goals can now be found in the care plan section) Acute Rehab PT Goals Patient Stated Goal: Return home PT Goal Formulation: With patient Time For  Goal Achievement: 05/14/22 Potential to Achieve Goals: Good Progress towards PT goals: Goals met/education completed, patient discharged from PT    Frequency           PT Plan Current plan remains appropriate;Other (comment) (Patient discharged to nursing for ambulation daily)    Co-evaluation              AM-PAC PT "6 Clicks" Mobility   Outcome Measure  Help needed turning from your back to your side while in a flat bed without using bedrails?: None Help needed moving from lying on your back to sitting on the side of a flat bed without using bedrails?: None Help needed moving to and from a bed to a chair (including a wheelchair)?: None Help needed standing up from a chair using your arms (e.g., wheelchair or bedside chair)?: None Help needed to walk in hospital room?: None Help needed climbing 3-5 steps with a railing? : A Little 6 Click Score: 23    End of  Session   Activity Tolerance: Patient tolerated treatment well Patient left: in chair;with call bell/phone within reach Nurse Communication: Mobility status PT Visit Diagnosis: Muscle weakness (generalized) (M62.81);Other abnormalities of gait and mobility (R26.89);Unsteadiness on feet (R26.81)     Time: 0160-1093 PT Time Calculation (min) (ACUTE ONLY): 27 min  Charges:  $Gait Training: 8-22 mins $Therapeutic Activity: 8-22 mins                     2:22 PM, 05/14/22 Lonell Grandchild, MPT Physical Therapist with [facility] [phone] office [phone] mobile phone

## 2022-05-14 NOTE — Progress Notes (Signed)
The patient needs a bedside commode because the beneficiary is confined to a single room.

## 2022-05-14 NOTE — Care Management Important Message (Signed)
Important Message  Patient Details  Name: Yvette Branch MRN: [id number] Date of Birth: 03/14/1946   Medicare Important Message Given:  Yes     Tommy Medal 05/14/2022, 11:00 AM

## 2022-05-15 LAB — BASIC METABOLIC PANEL
Anion gap: 9 (ref 5–15)
BUN: 12 mg/dL (ref 8–23)
CO2: 25 mmol/L (ref 22–32)
Calcium: 8 mg/dL — ABNORMAL LOW (ref 8.9–10.3)
Chloride: 104 mmol/L (ref 98–111)
Creatinine, Ser: 0.72 mg/dL (ref 0.44–1.00)
GFR, Estimated: >60 mL/min (ref >60)
Glucose, Bld: 114 mg/dL — ABNORMAL HIGH (ref 70–99)
Potassium: 3.5 mmol/L (ref 3.5–5.1)
Sodium: 138 mmol/L (ref 135–145)

## 2022-05-15 LAB — CBC WITH DIFFERENTIAL/PLATELET
Abs Immature Granulocytes: 1.06 10*3/uL — ABNORMAL HIGH (ref 0.00–0.07)
Basophils Absolute: 0.1 10*3/uL (ref 0.0–0.1)
Basophils Relative: 1 %
Eosinophils Absolute: 0.3 10*3/uL (ref 0.0–0.5)
Eosinophils Relative: 3 %
HCT: 31.1 % — ABNORMAL LOW (ref 36.0–46.0)
Hemoglobin: 10.2 g/dL — ABNORMAL LOW (ref 12.0–15.0)
Immature Granulocytes: 8 %
Lymphocytes Relative: 16 %
Lymphs Abs: 2 10*3/uL (ref 0.7–4.0)
MCH: 29.1 pg (ref 26.0–34.0)
MCHC: 32.8 g/dL (ref 30.0–36.0)
MCV: 88.6 fL (ref 80.0–100.0)
Monocytes Absolute: 1.2 10*3/uL — ABNORMAL HIGH (ref 0.1–1.0)
Monocytes Relative: 9 %
Neutro Abs: 8 10*3/uL — ABNORMAL HIGH (ref 1.7–7.7)
Neutrophils Relative %: 63 %
Platelets: 348 10*3/uL (ref 150–400)
RBC: 3.51 MIL/uL — ABNORMAL LOW (ref 3.87–5.11)
RDW: 13.5 % (ref 11.5–15.5)
WBC: 12.7 10*3/uL — ABNORMAL HIGH (ref 4.0–10.5)
nRBC: 0 % (ref 0.0–0.2)

## 2022-05-15 LAB — GLUCOSE, CAPILLARY
Glucose-Capillary: 121 mg/dL — ABNORMAL HIGH (ref 70–99)
Glucose-Capillary: 108 mg/dL — ABNORMAL HIGH (ref 70–99)
Glucose-Capillary: 115 mg/dL — ABNORMAL HIGH (ref 70–99)
Glucose-Capillary: 96 mg/dL (ref 70–99)
Glucose-Capillary: 168 mg/dL — ABNORMAL HIGH (ref 70–99)

## 2022-05-15 MED ORDER — ENOXAPARIN SODIUM 40 MG/0.4ML IJ SOSY
40.0000 mg | PREFILLED_SYRINGE | INTRAMUSCULAR | Status: DC
  Administered 2022-05-15 – 2022-05-16 (×2): 40 mg via SUBCUTANEOUS
  Filled 2022-05-15 (×2): qty 0.4

## 2022-05-15 NOTE — Progress Notes (Signed)
6 Days Post-Op  Subjective: Patient has no particular complaints.  Objective: Vital signs in last 24 hours: Temp:  [97.8 F (36.6 C)] 97.8 F (36.6 C) (02/02 2035) Pulse Rate:  [63-66] 66 (02/02 2035) Resp:  [16-20] 20 (02/02 2035) BP: (115-121)/(59-65) 121/65 (02/02 2035) SpO2:  [97 %-99 %] 99 % (02/02 2035) Last BM Date : 05/15/22  Intake/Output from previous day: 02/02 0701 - 02/03 0700 In: 120 [P.O.:120] Out: 400 [Stool:400] Intake/Output this shift: No intake/output data recorded.  General appearance: alert, cooperative, and no distress Resp: clear to auscultation bilaterally Cardio: regular rate and rhythm, S1, S2 normal, no murmur, click, rub or gallop GI: Soft, colostomy pink and patent.  Dressing intact along midline incision.  Lab Results:  Recent Labs    05/14/22 0418 05/15/22 0453  WBC 12.4* 12.7*  HGB 11.3* 10.2*  HCT 34.8* 31.1*  PLT 354 348   BMET Recent Labs    05/14/22 0418 05/15/22 0453  NA 134* 138  K 3.7 3.5  CL 103 104  CO2 26 25  GLUCOSE 98 114*  BUN 8 12  CREATININE 0.74 0.72  CALCIUM 8.2* 8.0*   PT/INR No results for input(s): "LABPROT", "INR" in the last 72 hours.  Studies/Results: No results found.  Anti-infectives: Anti-infectives (From admission, onward)    Start     Dose/Rate Route Frequency Ordered Stop   05/11/22 2200  piperacillin-tazobactam (ZOSYN) IVPB 3.375 g        3.375 g 12.5 mL/hr over 240 Minutes Intravenous Every 8 hours 05/11/22 1354 05/16/22 2159   05/11/22 1445  piperacillin-tazobactam (ZOSYN) IVPB 3.375 g        3.375 g 100 mL/hr over 30 Minutes Intravenous  Once 05/11/22 1353 05/11/22 1528   05/10/22 1000  cefoTEtan (CEFOTAN) 2 g in sodium chloride 0.9 % 100 mL IVPB  Status:  Discontinued        2 g 200 mL/hr over 30 Minutes Intravenous Every 12 hours 05/09/22 2241 05/11/22 1353   05/10/22 0600  cefoTEtan (CEFOTAN) 2 g in sodium chloride 0.9 % 100 mL IVPB        2 g 200 mL/hr over 30 Minutes  Intravenous On call to O.R. 05/09/22 2112 05/09/22 2132   05/09/22 2024  sodium chloride 0.9 % with cefoTEtan (CEFOTAN) ADS Med       Note to Pharmacy: Leeann Must M: cabinet override      05/09/22 2024 05/09/22 2113   05/09/22 1900  piperacillin-tazobactam (ZOSYN) IVPB 3.375 g        3.375 g 100 mL/hr over 30 Minutes Intravenous  Once 05/09/22 1847 05/09/22 2013       Assessment/Plan: s/p Procedure(s): COLECTOMY WITH COLOSTOMY CREATION/HARTMANN PROCEDURE Impression: Postoperative day 6.  Patient continues to recover well.  Is starting to learn ostomy care.  Continue dressing changes.  Home health arrangements are being made.  LOS: 6 days    Aviva Signs 05/15/2022

## 2022-05-15 NOTE — Progress Notes (Signed)
Patient alert and verbal, ambulated to and from bed to [facility] with no complaints. Changed patients dressing to surgical incision, cleansed, applied guaze and covered with ABD pad and tape. Changed patient colostomy bag per patient request. Patients niece at bedside earlier during shift educated on changing dressing and colostomy.

## 2022-05-16 LAB — GLUCOSE, CAPILLARY
Glucose-Capillary: 107 mg/dL — ABNORMAL HIGH (ref 70–99)
Glucose-Capillary: 109 mg/dL — ABNORMAL HIGH (ref 70–99)
Glucose-Capillary: 113 mg/dL — ABNORMAL HIGH (ref 70–99)
Glucose-Capillary: 118 mg/dL — ABNORMAL HIGH (ref 70–99)
Glucose-Capillary: 119 mg/dL — ABNORMAL HIGH (ref 70–99)
Glucose-Capillary: 134 mg/dL — ABNORMAL HIGH (ref 70–99)

## 2022-05-16 NOTE — Progress Notes (Signed)
7 Days Post-Op  Subjective: Patient resting comfortably.  Some upper abdominal cramping.  Objective: Vital signs in last 24 hours: Temp:  [97.8 F (36.6 C)-98.2 F (36.8 C)] 98.2 F (36.8 C) (02/04 0501) Pulse Rate:  [60-71] 69 (02/04 0501) Resp:  [16-20] 16 (02/04 0501) BP: (119-131)/(64-79) 131/66 (02/04 0501) SpO2:  [96 %-98 %] 97 % (02/04 0501) Last BM Date : 05/15/22  Intake/Output from previous day: 02/03 0701 - 02/04 0700 In: -  Out: 125 [Stool:125] Intake/Output this shift: Total I/O In: -  Out: 100 [Stool:100]  General appearance: alert, cooperative, and no distress Resp: clear to auscultation bilaterally Cardio: regular rate and rhythm, S1, S2 normal, no murmur, click, rub or gallop GI: Soft, incision reportedly healing well by nursing staff.  Colostomy pink and patent.  Lab Results:  Recent Labs    05/14/22 0418 05/15/22 0453  WBC 12.4* 12.7*  HGB 11.3* 10.2*  HCT 34.8* 31.1*  PLT 354 348   BMET Recent Labs    05/14/22 0418 05/15/22 0453  NA 134* 138  K 3.7 3.5  CL 103 104  CO2 26 25  GLUCOSE 98 114*  BUN 8 12  CREATININE 0.74 0.72  CALCIUM 8.2* 8.0*   PT/INR No results for input(s): "LABPROT", "INR" in the last 72 hours.  Studies/Results: No results found.  Anti-infectives: Anti-infectives (From admission, onward)    Start     Dose/Rate Route Frequency Ordered Stop   05/11/22 2200  piperacillin-tazobactam (ZOSYN) IVPB 3.375 g        3.375 g 12.5 mL/hr over 240 Minutes Intravenous Every 8 hours 05/11/22 1354 05/16/22 1759   05/11/22 1445  piperacillin-tazobactam (ZOSYN) IVPB 3.375 g        3.375 g 100 mL/hr over 30 Minutes Intravenous  Once 05/11/22 1353 05/11/22 1528   05/10/22 1000  cefoTEtan (CEFOTAN) 2 g in sodium chloride 0.9 % 100 mL IVPB  Status:  Discontinued        2 g 200 mL/hr over 30 Minutes Intravenous Every 12 hours 05/09/22 2241 05/11/22 1353   05/10/22 0600  cefoTEtan (CEFOTAN) 2 g in sodium chloride 0.9 % 100 mL IVPB         2 g 200 mL/hr over 30 Minutes Intravenous On call to O.R. 05/09/22 2112 05/09/22 2132   05/09/22 2024  sodium chloride 0.9 % with cefoTEtan (CEFOTAN) ADS Med       Note to Pharmacy: Leeann Must M: cabinet override      05/09/22 2024 05/09/22 2113   05/09/22 1900  piperacillin-tazobactam (ZOSYN) IVPB 3.375 g        3.375 g 100 mL/hr over 30 Minutes Intravenous  Once 05/09/22 1847 05/09/22 2013       Assessment/Plan: s/p Procedure(s): COLECTOMY WITH COLOSTOMY CREATION/HARTMANN PROCEDURE Impression: Stable on postoperative day 7.  Patient is doing well.  Will continue dressing changes.  Arrangements for home health are being made.  Anticipate discharge in next 24 to 48 hours.  LOS: 7 days    Aviva Signs 05/16/2022

## 2022-05-16 NOTE — Progress Notes (Signed)
Pt anxious during the night, but did not want anything to help calm her down. Pt ambulated in the hallway with NT. Emptied air from pt's colostomy bag multiple times during the night and 25 mL of stool per pt request. Abdominal dressing clean, dry, and intact.

## 2022-05-17 LAB — GLUCOSE, CAPILLARY
Glucose-Capillary: 111 mg/dL — ABNORMAL HIGH (ref 70–99)
Glucose-Capillary: 126 mg/dL — ABNORMAL HIGH (ref 70–99)

## 2022-05-17 MED ORDER — OXYCODONE HCL 5 MG PO TABS: 5.0000 mg | ORAL_TABLET | ORAL | 0 refills | Status: DC | PRN

## 2022-05-17 MED ORDER — ONDANSETRON 4 MG PO TBDP: 4.0000 mg | ORAL_TABLET | Freq: Four times a day (QID) | ORAL | 0 refills | Status: DC | PRN

## 2022-05-17 NOTE — Care Management Important Message (Signed)
Important Message  Patient Details  Name: Yvette Branch MRN: [id number] Date of Birth: Jun 08, 1945   Medicare Important Message Given:  Yes     Tommy Medal 05/17/2022, 11:14 AM

## 2022-05-17 NOTE — Discharge Summary (Addendum)
Physician Discharge Summary  Patient ID: Yvette Branch MRN: [id number] DOB/AGE: [birth year] 77 y.o.  Admit date: 05/09/2022 Discharge date: 05/17/2022  Admission Diagnoses: Colon perforation, pneumoperitoneum   Discharge Diagnoses:  Principal Problem:   Perforated abdominal viscus Active Problems:   Colitis   Perforation of sigmoid colon ([place]) Post operative respiratory failure   Pathology: FINAL MICROSCOPIC DIAGNOSIS:   A. COLON, SIGMOID, RESECTION:  -  Acute diverticulitis with evidence of perforation, acute serositis  and adhesion formation.  -  Margins viable.  -  3 benign/reactive lymph nodes   Discharged Condition: good  Hospital Course: Ms. Rizzuto is a 77 yo who had emergency colectomy and colostomy for a diverticulitis with perforation. Post operatively she was left intubated and was extubated the next day.  She did well post operatively and completed 5 days of antibiotics. She has been feeling good and is eating and having ostomy function. Her Niece Mearl Latin is helping with her ostomy care. She will have [place] and PT at home.   The patient has been up and moving and has pain controlled. She is ready for discharge home.  Consults:  Physical therapy, Wound RN  Significant Diagnostic Studies: CT with pneumoperitoneum and thickened colon  Treatments: IV hydration, antibiotics: Zosyn, and surgery: partial colectomy, end colostomy   Discharge Exam: Blood pressure (!) 124/52, pulse 62, temperature 97.8 F (36.6 C), temperature source Oral, resp. rate 16, height 5' 2"$  (1.575 m), weight 48.5 kg, SpO2 98 %. General appearance: alert, cooperative, and no distress GI: soft, non-distended, ostomy with stool, pink and healthy, midline packing removed, starting to get some pink beefy tissue in bed   Disposition: Discharge disposition: 01-Home or Self Care       Discharge Instructions     Call MD for:  difficulty breathing, headache or visual disturbances   Complete by: As directed     Call MD for:  extreme fatigue   Complete by: As directed    Call MD for:  persistant dizziness or light-headedness   Complete by: As directed    Call MD for:  persistant nausea and vomiting   Complete by: As directed    Call MD for:  redness, tenderness, or signs of infection (pain, swelling, redness, odor or green/yellow discharge around incision site)   Complete by: As directed    Call MD for:  severe uncontrolled pain   Complete by: As directed    Call MD for:  temperature >100.4   Complete by: As directed    Increase activity slowly   Complete by: As directed       Allergies as of 05/17/2022       Reactions   Doxycycline Other (See Comments)   Bad vision   Metronidazole Hives, Diarrhea, Rash   Numbness in face        Medication List     TAKE these medications    Align 4 MG Caps Take 1 capsule by mouth daily.   ALPRAZolam 0.25 MG tablet Commonly known as: XANAX Take 0.25 mg by mouth 2 (two) times daily. For anxiety   ascorbic acid 500 MG tablet Commonly known as: VITAMIN C Take 500 mg by mouth daily.   aspirin EC 81 MG tablet Take 81 mg by mouth daily.   Black Cohosh 540 MG Caps Take 540 mg by mouth daily.   CO Q 10 PO Take 1 tablet by mouth daily.   docusate sodium 100 MG capsule Commonly known as: COLACE Take 1 capsule (100 mg  total) by mouth 2 (two) times daily.   Fish Oil 1200 MG Caps Take 1 capsule by mouth daily.   glucosamine-chondroitin 500-400 MG tablet Take 2 tablets by mouth daily.   Hair/Skin/Nails/Biotin Tabs Take by mouth daily.   Ocuvite Adult 50+ Caps Take by mouth daily.   levothyroxine 50 MCG tablet Commonly known as: SYNTHROID Take 50 mcg by mouth daily before breakfast.   ondansetron 4 MG disintegrating tablet Commonly known as: ZOFRAN-ODT Take 1 tablet (4 mg total) by mouth every 6 (six) hours as needed for nausea.   oxyCODONE 5 MG immediate release tablet Commonly known as: Roxicodone Take 1 tablet (5 mg total)  by mouth every 4 (four) hours as needed.   polyethylene glycol 17 g packet Commonly known as: MIRALAX / GLYCOLAX Take 17 g by mouth at bedtime. For constipation   pyridOXINE 100 MG tablet Commonly known as: VITAMIN B6 Take 100 mg by mouth daily.   vitamin A 8000 UNIT capsule Take 8,000 Units by mouth daily.   vitamin E 180 MG (400 UNITS) capsule Take 400 Units by mouth daily.   vitamin k 100 MCG tablet Take 100 mcg by mouth daily.               Durable Medical Equipment  (From admission, onward)           Start     Ordered   05/14/22 1433  For home use only DME 3 n 1  Once        05/14/22 1433   05/14/22 1415  For home use only DME Bedside commode  Once       Question:  Patient needs a bedside commode to treat with the following condition  Answer:  Gait difficulty   05/14/22 1415            Future Appointments  Date Time Provider [place]  05/26/2022  3:00 PM Virl Cagey, MD RS-RS None  01/13/2023  3:00 PM Harvel Quale, MD NRE-NRE None     Signed: Virl Cagey 05/17/2022, 10:49 AM

## 2022-05-17 NOTE — Discharge Instructions (Signed)
Discharge Open Abdominal Surgery Instructions:  Common Complaints: Pain at the incision site is common. This will improve with time. Take your pain medications as described below. Some nausea is common and poor appetite. The main goal is to stay hydrated the first few days after surgery.   Diet/ Activity: Diet as tolerated. You have started and tolerated a diet in the hospital, and should continue to increase what you are able to eat.   You may not have a large appetite, but it is important to stay hydrated. Drink 64 ounces of water a day. Your appetite will return with time.  Pack your midline wound daily with saline dampened gauze and cover pad and paper tape. Home health will come out a few times a week but you are responsible for this the other days.  Change your ostomy as needed or as instructed by the ostomy RN.  Make sure you have sufficient ostomy supplies and never get down to just one wafer and bag.  Shower per your regular routine daily.  Do not submerge in water.  Rest and listen to your body, but do not remain in bed all day.  Walk everyday for at least 15-20 minutes.  Deep cough and move around every 1-2 hours in the first few days after surgery.  Do not lift > 10 lbs, perform excessive bending, pushing, pulling, squatting for 6-8 weeks after surgery.  The activity restrictions and the abdominal binder are to prevent hernia formation at your incision while you are healing.  Do not place lotions or balms on your incision unless instructed to specifically by Dr. Constance Haw.   Pain Expectations and Narcotics: -After surgery you will have pain associated with your incisions and this is normal. The pain is muscular and nerve pain, and will get better with time. -You are encouraged and expected to take non narcotic medications like tylenol and ibuprofen (when able) to treat pain as multiple modalities can aid with pain treatment. -Narcotics are only used when pain is severe or there is  breakthrough pain. -You are not expected to have a pain score of 0 after surgery, as we cannot prevent pain. A pain score of 3-4 that allows you to be functional, move, walk, and tolerate some activity is the goal. The pain will continue to improve over the days after surgery and is dependent on your surgery. -Due to [place] law, we are only able to give a certain amount of pain medication to treat post operative pain, and we only give additional narcotics on a patient by patient basis.  -For most laparoscopic surgery, studies have shown that the majority of patients only need 10-15 narcotic pills, and for open surgeries most patients only need 15-20.   -Having appropriate expectations of pain and knowledge of pain management with non narcotics is important as we do not want anyone to become addicted to narcotic pain medication.  -Using ice packs in the first 48 hours and heating pads after 48 hours, wearing an abdominal binder (when recommended), and using over the counter medications are all ways to help with pain management.   -Simple acts like meditation and mindfulness practices after surgery can also help with pain control and research has proven the benefit of these practices.  Medication: Take tylenol and ibuprofen as needed for pain control, alternating every 4-6 hours.  Example:  Tylenol '1000mg'$  @ 6am, 12noon, 6pm, 44mdnight (Do not exceed '4000mg'$  of tylenol a day). Ibuprofen '800mg'$  @ 9am, 3pm, 9pm, 3am (Do not exceed  $'3600mg'h$  of ibuprofen a day).  Take Roxicodone for breakthrough pain every 4 hours.  Take your colace and miralax as you need to for your normal Bms.  Drink plenty of water to also prevent constipation.   Contact Information: If you have questions or concerns, please call our office, [phone], Monday- Thursday 8AM-5PM and Friday 8AM-12Noon.  If it is after hours or on the weekend, please call [facility] Main Number, [phone], [phone], and ask to speak to the surgeon on call  for Dr. Constance Haw at [facility].

## 2022-05-17 NOTE — TOC Transition Note (Signed)
Transition of Care [facility]) - CM/SW Discharge Note   Patient Details  Name: CAYLAN CHENARD MRN: [id number] Date of Birth: [birth year]  [facility] CM/SW Contact:  Iona Beard, [place] Phone Number: 05/17/2022, 10:59 AM  Clinical Narrative:    CSW updated by MD that pt is medically ready for D/C today. CSW updated Tommi Rumps with [facility] that pt will be discharging home today. HH orders have been placed. Pts DME ordered through [employer]. TOC signing off.   Final next level of care: Home w Home Health Services Barriers to Discharge: Barriers Resolved   Patient Goals and CMS Choice CMS Medicare.gov Compare Post Acute Care list provided to:: Patient Choice offered to / list presented to : Patient  Discharge Placement                         Discharge Plan and Services Additional resources added to the After Visit Summary for   In-house Referral: Clinical Social Work Discharge Planning Services: CM Consult Post Acute Care Choice: Home Health, Durable Medical Equipment          DME Arranged: Bedside commode DME Agency: [employer]       HH Arranged: RN, PT, Nurse's Aide [place] Agency: [place] Date [place]: 05/17/22   Representative spoke with at [place]: [place] Determinants of Health ([place]) Interventions SDOH Screenings   Food Insecurity: No Food Insecurity (05/13/2022)  Housing: Low Risk  (05/13/2022)  Transportation Needs: No Transportation Needs (05/13/2022)  Utilities: Not At Risk (05/13/2022)  Tobacco Use: Low Risk  (05/13/2022)     Readmission Risk Interventions     No data to display

## 2022-05-18 NOTE — Progress Notes (Signed)
CSW spoke to [place] with [employer] 2/6 who confirms that pts bedside commode will be delivered by tomorrow at 7pm.

## 2022-05-26 ENCOUNTER — Encounter: Payer: Self-pay | Admitting: General Surgery

## 2022-05-26 ENCOUNTER — Ambulatory Visit (INDEPENDENT_AMBULATORY_CARE_PROVIDER_SITE_OTHER): Payer: Medicare Other | Admitting: General Surgery

## 2022-05-26 VITALS — BP 137/73 | HR 77 | Temp 98.2°F | Resp 12 | Ht 62.0 in | Wt 102.0 lb

## 2022-05-26 DIAGNOSIS — R198 Other specified symptoms and signs involving the digestive system and abdomen: Secondary | ICD-10-CM

## 2022-05-26 NOTE — Progress Notes (Unsigned)
[facility]  Patient still living with her niece. She has not really worked on her ostomy herself much. [place] RN coming into help. She is eating more and having stool from her ostomy.  BP 137/73   Pulse 77   Temp 98.2 F (36.8 C) (Oral)   Resp 12   Ht 5' 2"$  (1.575 m)   Wt 102 lb (46.3 kg)   SpO2 97%   BMI 18.66 kg/m  Ostomy pink and wafer and bg in good position Midline with granulation, packing replaced  Patient s/p end colostomy colectomy for perforated diverticulitis. Doing well.   Diet as tolerated. No heavy lifting > 10 lbs, excessive bending, pushing, pulling, or squatting for 6-8 weeks after surgery.  Continue packing the wound Continue ostomy care Try to get more independent with ostomy care.   Future Appointments  Date Time Provider [place]  06/10/2022  1:15 PM Virl Cagey, MD RS-RS None  01/13/2023  3:00 PM Harvel Quale, MD NRE-NRE None   Curlene Labrum, MD [facility] [address] [place], [place] [address] [phone] (office)

## 2022-05-26 NOTE — Patient Instructions (Signed)
Diet as tolerated. No heavy lifting > 10 lbs, excessive bending, pushing, pulling, or squatting for 6-8 weeks after surgery.  Continue packing the wound Continue ostomy care

## 2022-06-10 ENCOUNTER — Other Ambulatory Visit: Payer: Self-pay | Admitting: *Deleted

## 2022-06-10 ENCOUNTER — Ambulatory Visit (INDEPENDENT_AMBULATORY_CARE_PROVIDER_SITE_OTHER): Payer: Medicare Other | Admitting: General Surgery

## 2022-06-10 ENCOUNTER — Encounter: Payer: Self-pay | Admitting: General Surgery

## 2022-06-10 VITALS — BP 129/72 | HR 71 | Temp 98.0°F | Resp 12 | Ht 62.0 in | Wt 102.0 lb

## 2022-06-10 DIAGNOSIS — Z933 Colostomy status: Secondary | ICD-10-CM

## 2022-06-10 DIAGNOSIS — R198 Other specified symptoms and signs involving the digestive system and abdomen: Secondary | ICD-10-CM

## 2022-06-10 NOTE — Progress Notes (Signed)
[facility]  Doing well. Starting to do her own ostomy care. Feeling better.  BP 129/72   Pulse 71   Temp 98 F (36.7 C) (Oral)   Resp 12   Ht '5\' 2"'$  (1.575 m)   Wt 102 lb (46.3 kg)   SpO2 97%   BMI 18.66 kg/m  Ostomy with stool in bag Midline with granulation, very shallow, less than 0.5cm wide and 5cm long  Patient s/p colectomy, end colostomy for perforated diverticulitis.   Continue ostomy care. Care try to go longer between ostomy wafer changes. Petroleum gauze to midline wound daily.  Continue bowel regimen/ miralax as needed.  Will refer you to GI for colonoscopy through stoma and rectal stump before reversing  Looking at May/June reversal at the earliest   Future Appointments  Date Time Provider [place]  07/21/2022  2:15 PM Virl Cagey, MD RS-RS None  01/13/2023  3:00 PM Harvel Quale, MD NRE-NRE None   Curlene Labrum, MD [facility] [address] [place], [place] [address] [phone] (office)

## 2022-06-10 NOTE — Patient Instructions (Signed)
Continue ostomy care. Care try to go longer between ostomy wafer changes. Petroleum gauze to midline wound daily.  Continue bowel regimen/ miralax as needed.  Will refer you to GI for colonoscopy

## 2022-06-14 ENCOUNTER — Encounter (INDEPENDENT_AMBULATORY_CARE_PROVIDER_SITE_OTHER): Payer: Self-pay | Admitting: Gastroenterology

## 2022-06-14 ENCOUNTER — Ambulatory Visit (INDEPENDENT_AMBULATORY_CARE_PROVIDER_SITE_OTHER): Payer: Medicare Other | Admitting: Gastroenterology

## 2022-06-14 ENCOUNTER — Telehealth (INDEPENDENT_AMBULATORY_CARE_PROVIDER_SITE_OTHER): Payer: Self-pay | Admitting: Gastroenterology

## 2022-06-14 VITALS — BP 117/69 | HR 66 | Temp 97.8°F | Ht 62.0 in | Wt 103.5 lb

## 2022-06-14 DIAGNOSIS — Z939 Artificial opening status, unspecified: Secondary | ICD-10-CM

## 2022-06-14 DIAGNOSIS — K631 Perforation of intestine (nontraumatic): Secondary | ICD-10-CM | POA: Diagnosis not present

## 2022-06-14 MED ORDER — PEG 3350-KCL-NA BICARB-NACL 420 G PO SOLR: 4000.0000 mL | Freq: Once | ORAL | 0 refills | Status: AC

## 2022-06-14 NOTE — Telephone Encounter (Signed)
TCS scheduled for 07/08/22. Prep sent to pharmacy. Instructions sent via my chart. Will call patient with pre op appt.

## 2022-06-14 NOTE — H&P (View-Only) (Signed)
 Referring Provider: Golding, John, MD Primary Care Physician:  Golding, John, MD Primary GI Physician: Castaneda   Chief Complaint  Patient presents with   Hospitalization Follow-up    Follow up hospital stay. Has BM every day. Takes miralax as needed and one stool softner daily. Has some abdominal pain. Worse in the mornings when waking up.    HPI:   Yvette Branch is a 77 y.o. female with past medical history of anxiety, arthritis, bowel obstruction, GERD, malnutrition, osteoporosis, vaginal and rectal prolapse.   Patient presenting today for hospital follow up.   Last seen October 2023, hospital follow up at that time for abdominal pain, seen in the ED for constipation. She continued on miralax 1-2 capfuls per day, 2 stool softeners. Doing well on this. Advised to continue on current bowel regimen.   Patient presented to ED on 1/28 with lower abdominal pain CT at that time concerning for colon perforation likely secondary to diffuse inflammatory colitis. 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 Patient here today to schedule colonoscopy through stoma/rectal stump prior to take down of ostomy in May/June. She proceeded to ED on Sunday as she was still feeling ill. Notes abd pain had increased by then. She notes she was then taken to emergency surgery after CT showed perforation.   Present: Patient reports that prior to her surgery in January. She developed a fever, thought she may have a virus. Had minimal abd pain at that time, thinks she may have had some harder stools then. She was able to eat and fever seemed to subside. She went a few days and did not feel great but felt it was manageable at home. On that Sunday she began having worsening abd pain and proceeded to the ED where she had CT and told she had perforation, underwent surgery as above.  She is doing well with ostomy. Has some occasional Right abdominal pain. Denies blood in stools or melena. She is  emptying her ostomy bag about twice a day. She is using miralax as needed based on her output. Typically she is doing 1/2 capful of miralax and 1 stool softener every other day. Appetite is good. She is living with her niece currently since her hospital admission, overall doing well. She is ready to have her ostomy takedown.    Last Colonoscopy:08/02/14 incomplete due to tortuous, fixed colon, diverticulitis  Virtual colonoscopy: 08/2014 Moderately severe rectosigmoid colon and distal descending colon diverticulosis with multiple diverticula. This results in poor distension of these areas and compromised assessment. 2. The only questionable polypoid lesion identified is a 5 mm polypoid structure at 13 cm from the anal verge. No clinically significant polypoid lesion or constricting lesion is seen. Last Endoscopy:n/a   Past Medical History:  Diagnosis Date   Abdominal abscess    Anxiety    Arthritis    Bowel obstruction (HCC)    Multiple   Complication of anesthesia    trouble waking up with colonoscopy2007   Degenerative arthritis of lumbar spine    GERD (gastroesophageal reflux disease)    Hot flashes, menopausal    Intra-abdominal abscess post-procedure    Malnutrition (HCC)    Osteoporosis    Rectal prolapse    Vaginal prolapse     Past Surgical History:  Procedure Laterality Date   ABDOMINAL HYSTERECTOMY     ABDOMINAL SURGERY     bowel obstruction     COLECTOMY WITH COLOSTOMY CREATION/HARTMANN PROCEDURE N/A 05/09/2022   Procedure: COLECTOMY WITH COLOSTOMY   CREATION/HARTMANN PROCEDURE;  Surgeon: Bridges, Lindsay C, MD;  Location: [facility];  Service: General;  Laterality: N/A;   COLONOSCOPY N/A 08/02/2014   Procedure: COLONOSCOPY;  Surgeon: Najeeb U Rehman, MD;  Location: [facility];  Service: Endoscopy;  Laterality: N/A;  1240   TONSILLECTOMY      Current Outpatient Medications  Medication Sig Dispense Refill   ALPRAZolam (XANAX) 0.25 MG tablet Take 0.25 mg by mouth 2  (two) times daily. For anxiety     aspirin EC 81 MG tablet Take 81 mg by mouth daily.     docusate sodium (COLACE) 100 MG capsule Take 1 capsule (100 mg total) by mouth 2 (two) times daily. 10 capsule 0   glucosamine-chondroitin 500-400 MG tablet Take 2 tablets by mouth daily.     levothyroxine (SYNTHROID, LEVOTHROID) 50 MCG tablet Take 50 mcg by mouth daily before breakfast.   4   Multiple Vitamins-Minerals (HAIR/SKIN/NAILS/BIOTIN) TABS Take by mouth daily.     Multiple Vitamins-Minerals (OCUVITE ADULT 50+) CAPS Take by mouth daily.     Omega-3 Fatty Acids (FISH OIL) 1200 MG CAPS Take 1 capsule by mouth daily.     OVER THE COUNTER MEDICATION Calcium 600mg 2 daily     polyethylene glycol (MIRALAX / GLYCOLAX) 17 g packet Take 17 g by mouth at bedtime. For constipation 30 each 11   Probiotic Product (ALIGN) 4 MG CAPS Take 1 capsule by mouth daily. 30 capsule 0   pyridOXINE (VITAMIN B-6) 100 MG tablet Take 100 mg by mouth daily.     vitamin A 8000 UNIT capsule Take 8,000 Units by mouth daily.     vitamin C (ASCORBIC ACID) 500 MG tablet Take 500 mg by mouth daily.     vitamin E 400 UNIT capsule Take 400 Units by mouth daily.     vitamin k 100 MCG tablet Take 100 mcg by mouth daily.      No current facility-administered medications for this visit.    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   Drug use: No   Sexual activity: Yes    Birth control/protection: Surgical  Other Topics Concern   Not on file  Social History Narrative   Single.  Unmarried, no children.  Lives alone. Independent of ADLs, ambulation.   Social Determinants of Health   Financial Resource Strain: Not on file  Food Insecurity: No Food Insecurity (05/13/2022)   Hunger Vital Sign    Worried About Running Out of Food in the Last Year: Never true    Ran Out   of Food in the Last Year: Never true  Transportation Needs: No Transportation Needs (05/13/2022)   PRAPARE - Transportation    Lack of Transportation (Medical): No    Lack of Transportation (Non-Medical): No  Physical Activity: Not on file  Stress: Not on file  Social Connections: Not on file    Review of systems General: negative for malaise, night sweats, fever, chills, weight loss Neck: Negative for lumps, goiter, pain and significant neck swelling Resp: Negative for cough, wheezing, dyspnea at rest CV: Negative for chest pain, leg swelling, palpitations, orthopnea GI: denies melena, hematochezia, nausea, vomiting, diarrhea, constipation, dysphagia, odyonophagia, early satiety or unintentional weight loss.  MSK: Negative for joint pain or swelling, back pain, and muscle pain. Derm: Negative for itching or rash Psych: Denies depression, anxiety, memory loss, confusion. No homicidal or suicidal ideation.  Heme: Negative for prolonged bleeding, bruising easily, and swollen nodes. Endocrine: Negative for cold or heat intolerance, polyuria, polydipsia and goiter. Neuro: negative for tremor, gait imbalance, syncope and seizures. The remainder of the review of systems is noncontributory.  Physical Exam: BP 117/69 (BP Location: Left Arm, Patient Position: Sitting, Cuff Size:  Normal)   Pulse 66   Temp 97.8 F (36.6 C) (Oral)   Ht 5' 2" (1.575 m)   Wt 103 lb 8 oz (46.9 kg)   BMI 18.93 kg/m  General:   Alert and oriented. No distress noted. Pleasant and cooperative.  Head:  Normocephalic and atraumatic. Eyes:  Conjuctiva clear without scleral icterus. Mouth:  Oral mucosa pink and moist. Good dentition. No lesions. Heart: Normal rate and rhythm, s1 and s2 heart sounds present.  Lungs: Clear lung sounds in all lobes. Respirations equal and unlabored. Abdomen:  +BS, soft, non-tender and non-distended. Ostomy to Right abdomen with healthy appearing stoma, brown stool noted in ostomy bag. Dressed Incision to mid abdomen  Derm: No palmar erythema or jaundice Msk:  Symmetrical without gross deformities. Normal posture. Extremities:  Without edema. Neurologic:  Alert and  oriented x4 Psych:  Alert and cooperative. Normal mood and affect.  Invalid input(s): "6 MONTHS"   ASSESSMENT: Autym S Tabet is a 76 y.o. female presenting today for hospital follow up  Hospital admission for perforation of colon requiring partial colectomy with end colostomy. She has done well thus far. Follow up with General surgery   on 2/29, recommendations for colonoscopy prior to ostomy take down. She denies blood in stools, black stools. Has occasional abdominal pain but not severe. Will get her scheduled for colonoscopy through ostomy and rectal stump prior to her ostomy take down. Colonoscopy should be done atleast 8 weeks after surgery which was 1/28. Will plan for this around the end of march/early April. Indications, risks and benefits of procedure discussed in detail with patient. Patient verbalized understanding and is in agreement to proceed with colonoscopy.    PLAN:  Schedule colonoscopy ASA III ENDO 3  2. Continue with miralax and stool softener  3.  Low gas/low fiber foods (low FODMAP)  All questions were answered, patient verbalized understanding and is in agreement with plan  as outlined above.   Follow Up: 3 months   Dolorez Jeffrey L. Eliab Closson, MSN, APRN, AGNP-C Adult-Gerontology Nurse Practitioner [facility]  I have reviewed the note and agree with the APP's assessment as described in this progress note  Daniel Castaneda, MD Gastroenterology and Hepatology [facility] 

## 2022-06-14 NOTE — Addendum Note (Signed)
Addended by: Harvel Quale on: 06/14/2022 07:49 PM   Modules accepted: Level of Service

## 2022-06-14 NOTE — Patient Instructions (Signed)
We will get you scheduled for colonoscopy around the end of March I am providing the low fodmap food guide which may help with figuring out which foods you tolerate best You can continue miralax and stool softner  Follow up 3 months  It was a pleasure to see you today. I want to create trusting relationships with patients and provide genuine, compassionate, and quality care. I truly value your feedback! please be on the lookout for a survey regarding your visit with me today. I appreciate your input about our visit and your time in completing this!    Brandilee Pies L. Alver Sorrow, MSN, APRN, AGNP-C Adult-Gerontology Nurse Practitioner [facility]

## 2022-06-14 NOTE — Progress Notes (Addendum)
Referring Provider: Sharilyn Sites, MD Primary Care Physician:  Sharilyn Sites, MD Primary GI Physician: Jenetta Downer   Chief Complaint  Patient presents with   Hospitalization Follow-up    Follow up hospital stay. Has BM every day. Takes miralax as needed and one stool softner daily. Has some abdominal pain. Worse in the mornings when waking up.    HPI:   Yvette Branch is a 77 y.o. female with past medical history of anxiety, arthritis, bowel obstruction, GERD, malnutrition, osteoporosis, vaginal and rectal prolapse.   Patient presenting today for hospital follow up.   Last seen October 2023, hospital follow up at that time for abdominal pain, seen in the ED for constipation. She continued on miralax 1-2 capfuls per day, 2 stool softeners. Doing well on this. Advised to continue on current bowel regimen.   Patient presented to ED on 1/28 with lower abdominal pain CT at that time concerning for colon perforation likely secondary to diffuse inflammatory colitis. 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 Patient here today to schedule colonoscopy through stoma/rectal stump prior to take down of ostomy in May/June. She proceeded to ED on Sunday as she was still feeling ill. Notes abd pain had increased by then. She notes she was then taken to emergency surgery after CT showed perforation.   Present: Patient reports that prior to her surgery in January. She developed a fever, thought she may have a virus. Had minimal abd pain at that time, thinks she may have had some harder stools then. She was able to eat and fever seemed to subside. She went a few days and did not feel great but felt it was manageable at home. On that Sunday she began having worsening abd pain and proceeded to the ED where she had CT and told she had perforation, underwent surgery as above.  She is doing well with ostomy. Has some occasional Right abdominal pain. Denies blood in stools or melena. She is  emptying her ostomy bag about twice a day. She is using miralax as needed based on her output. Typically she is doing 1/2 capful of miralax and 1 stool softener every other day. Appetite is good. She is living with her niece currently since her hospital admission, overall doing well. She is ready to have her ostomy takedown.    Last Colonoscopy:08/02/14 incomplete due to tortuous, fixed colon, diverticulitis  Virtual colonoscopy: 08/2014 Moderately severe rectosigmoid colon and distal descending colon diverticulosis with multiple diverticula. This results in poor distension of these areas and compromised assessment. 2. The only questionable polypoid lesion identified is a 5 mm polypoid structure at 13 cm from the anal verge. No clinically significant polypoid lesion or constricting lesion is seen. Last Endoscopy:n/a   Past Medical History:  Diagnosis Date   Abdominal abscess    Anxiety    Arthritis    Bowel obstruction (HCC)    Multiple   Complication of anesthesia    trouble waking up with colonoscopy2007   Degenerative arthritis of lumbar spine    GERD (gastroesophageal reflux disease)    Hot flashes, menopausal    Intra-abdominal abscess post-procedure    Malnutrition ([place])    Osteoporosis    Rectal prolapse    Vaginal prolapse     Past Surgical History:  Procedure Laterality Date   ABDOMINAL HYSTERECTOMY     ABDOMINAL SURGERY     bowel obstruction     COLECTOMY WITH COLOSTOMY CREATION/HARTMANN PROCEDURE N/A 05/09/2022   Procedure: COLECTOMY WITH COLOSTOMY  CREATION/HARTMANN PROCEDURE;  Surgeon: Virl Cagey, MD;  Location: [facility];  Service: General;  Laterality: N/A;   COLONOSCOPY N/A 08/02/2014   Procedure: COLONOSCOPY;  Surgeon: Rogene Houston, MD;  Location[facility];  Service: Endoscopy;  Laterality: N/A;  1240   TONSILLECTOMY      Current Outpatient Medications  Medication Sig Dispense Refill   ALPRAZolam (XANAX) 0.25 MG tablet Take 0.25 mg by mouth 2  (two) times daily. For anxiety     aspirin EC 81 MG tablet Take 81 mg by mouth daily.     docusate sodium (COLACE) 100 MG capsule Take 1 capsule (100 mg total) by mouth 2 (two) times daily. 10 capsule 0   glucosamine-chondroitin 500-400 MG tablet Take 2 tablets by mouth daily.     levothyroxine (SYNTHROID, LEVOTHROID) 50 MCG tablet Take 50 mcg by mouth daily before breakfast.   4   Multiple Vitamins-Minerals (HAIR/SKIN/NAILS/BIOTIN) TABS Take by mouth daily.     Multiple Vitamins-Minerals (OCUVITE ADULT 50+) CAPS Take by mouth daily.     Omega-3 Fatty Acids (FISH OIL) 1200 MG CAPS Take 1 capsule by mouth daily.     OVER THE COUNTER MEDICATION Calcium '600mg'$  2 daily     polyethylene glycol (MIRALAX / GLYCOLAX) 17 g packet Take 17 g by mouth at bedtime. For constipation 30 each 11   Probiotic Product (ALIGN) 4 MG CAPS Take 1 capsule by mouth daily. 30 capsule 0   pyridOXINE (VITAMIN B-6) 100 MG tablet Take 100 mg by mouth daily.     vitamin A 8000 UNIT capsule Take 8,000 Units by mouth daily.     vitamin C (ASCORBIC ACID) 500 MG tablet Take 500 mg by mouth daily.     vitamin E 400 UNIT capsule Take 400 Units by mouth daily.     vitamin k 100 MCG tablet Take 100 mcg by mouth daily.      No current facility-administered medications for this visit.    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   Drug use: No   Sexual activity: Yes    Birth control/protection: Surgical  Other Topics Concern   Not on file  Social History Narrative   Single.  Unmarried, no children.  Lives alone. Independent of ADLs, ambulation.   Social Determinants of Health   Financial Resource Strain: Not on file  Food Insecurity: No Food Insecurity (05/13/2022)   Hunger Vital Sign    Worried About Running Out of Food in the Last Year: Never true    Ran Out  of Food in the Last Year: Never true  Transportation Needs: No Transportation Needs (05/13/2022)   PRAPARE - Hydrologist (Medical): No    Lack of Transportation (Non-Medical): No  Physical Activity: Not on file  Stress: Not on file  Social Connections: Not on file    Review of systems General: negative for malaise, night sweats, fever, chills, weight loss Neck: Negative for lumps, goiter, pain and significant neck swelling Resp: Negative for cough, wheezing, dyspnea at rest CV: Negative for chest pain, leg swelling, palpitations, orthopnea GI: denies melena, hematochezia, nausea, vomiting, diarrhea, constipation, dysphagia, odyonophagia, early satiety or unintentional weight loss.  MSK: Negative for joint pain or swelling, back pain, and muscle pain. Derm: Negative for itching or rash Psych: Denies depression, anxiety, memory loss, confusion. No homicidal or suicidal ideation.  Heme: Negative for prolonged bleeding, bruising easily, and swollen nodes. Endocrine: Negative for cold or heat intolerance, polyuria, polydipsia and goiter. Neuro: negative for tremor, gait imbalance, syncope and seizures. The remainder of the review of systems is noncontributory.  Physical Exam: BP 117/69 (BP Location: Left Arm, Patient Position: Sitting, Cuff Size:  Normal)   Pulse 66   Temp 97.8 F (36.6 C) (Oral)   Ht '5\' 2"'$  (1.575 m)   Wt 103 lb 8 oz (46.9 kg)   BMI 18.93 kg/m  General:   Alert and oriented. No distress noted. Pleasant and cooperative.  Head:  Normocephalic and atraumatic. Eyes:  Conjuctiva clear without scleral icterus. Mouth:  Oral mucosa pink and moist. Good dentition. No lesions. Heart: Normal rate and rhythm, s1 and s2 heart sounds present.  Lungs: Clear lung sounds in all lobes. Respirations equal and unlabored. Abdomen:  +BS, soft, non-tender and non-distended. Ostomy to Right abdomen with healthy appearing stoma, brown stool noted in ostomy bag. Dressed Incision to mid abdomen  Derm: No palmar erythema or jaundice Msk:  Symmetrical without gross deformities. Normal posture. Extremities:  Without edema. Neurologic:  Alert and  oriented x4 Psych:  Alert and cooperative. Normal mood and affect.  Invalid input(s): "6 MONTHS"   ASSESSMENT: Yvette Branch is a 77 y.o. female presenting today for hospital follow up  Hospital admission for perforation of colon requiring partial colectomy with end colostomy. She has done well thus far. Follow up with General surgery   on 2/29, recommendations for colonoscopy prior to ostomy take down. She denies blood in stools, black stools. Has occasional abdominal pain but not severe. Will get her scheduled for colonoscopy through ostomy and rectal stump prior to her ostomy take down. Colonoscopy should be done atleast 8 weeks after surgery which was 1/28. Will plan for this around the end of march/early April. Indications, risks and benefits of procedure discussed in detail with patient. Patient verbalized understanding and is in agreement to proceed with colonoscopy.    PLAN:  Schedule colonoscopy ASA III ENDO 3  2. Continue with miralax and stool softener  3.  Low gas/low fiber foods (low FODMAP)  All questions were answered, patient verbalized understanding and is in agreement with plan  as outlined above.   Follow Up: 3 months   Mariamawit Depaoli L. Alver Sorrow, MSN, APRN, AGNP-C Adult-Gerontology Nurse Practitioner [facility]  I have reviewed the note and agree with the APP's assessment as described in this progress note  Maylon Peppers, MD Gastroenterology and Hepatology [facility]

## 2022-06-15 NOTE — Telephone Encounter (Signed)
Yvette Branch niece called in regarding pre op appt. Gave pre op appt to niece 07/06/22 '@11'$ :30 in person at [employer] [employer]

## 2022-06-18 ENCOUNTER — Telehealth (INDEPENDENT_AMBULATORY_CARE_PROVIDER_SITE_OTHER): Payer: Self-pay | Admitting: Gastroenterology

## 2022-06-18 NOTE — Telephone Encounter (Signed)
She should not take it on a regular basis, but she can take it for her colonoscopy prep. Thanks

## 2022-06-18 NOTE — Telephone Encounter (Signed)
Pt called in and states she was told by Dr.Rehman to not take Dulcolax or stool softeners. Pt has TCS on 07/08/22. Pt is wanting to know if she is to still take Dulcolax or what does provider recommend. Please advise. Thank you.

## 2022-06-21 NOTE — Telephone Encounter (Signed)
Left detailed message on [facility] voicemail (ok per DPR)

## 2022-06-27 ENCOUNTER — Other Ambulatory Visit: Payer: Self-pay

## 2022-06-27 ENCOUNTER — Encounter (HOSPITAL_COMMUNITY): Payer: Self-pay | Admitting: Emergency Medicine

## 2022-06-27 ENCOUNTER — Emergency Department (HOSPITAL_COMMUNITY): Payer: Medicare Other

## 2022-06-27 ENCOUNTER — Emergency Department (HOSPITAL_COMMUNITY)
Admission: EM | Admit: 2022-06-27 | Discharge: 2022-06-27 | Disposition: A | Discharge status: Home / Self Care | Payer: Medicare Other | Attending: Emergency Medicine | Admitting: Emergency Medicine

## 2022-06-27 DIAGNOSIS — E876 Hypokalemia: Secondary | ICD-10-CM | POA: Diagnosis not present

## 2022-06-27 DIAGNOSIS — R1030 Lower abdominal pain, unspecified: Secondary | ICD-10-CM | POA: Diagnosis present

## 2022-06-27 LAB — COMPREHENSIVE METABOLIC PANEL
ALT: 15 U/L (ref 0–44)
AST: 25 U/L (ref 15–41)
Albumin: 4.2 g/dL (ref 3.5–5.0)
Alkaline Phosphatase: 53 U/L (ref 38–126)
Anion gap: 9 (ref 5–15)
BUN: 18 mg/dL (ref 8–23)
CO2: 29 mmol/L (ref 22–32)
Calcium: 9.8 mg/dL (ref 8.9–10.3)
Chloride: 97 mmol/L — ABNORMAL LOW (ref 98–111)
Creatinine, Ser: 0.71 mg/dL (ref 0.44–1.00)
GFR, Estimated: >60 mL/min (ref >60)
Glucose, Bld: 134 mg/dL — ABNORMAL HIGH (ref 70–99)
Potassium: 3.4 mmol/L — ABNORMAL LOW (ref 3.5–5.1)
Sodium: 135 mmol/L (ref 135–145)
Total Bilirubin: 0.5 mg/dL (ref 0.3–1.2)
Total Protein: 7.7 g/dL (ref 6.5–8.1)

## 2022-06-27 LAB — URINALYSIS, W/ REFLEX TO CULTURE (INFECTION SUSPECTED)
Bacteria, UA: NONE SEEN
Bilirubin Urine: NEGATIVE
Glucose, UA: NEGATIVE mg/dL
Hgb urine dipstick: NEGATIVE
Ketones, ur: 5 mg/dL — AB
Leukocytes,Ua: NEGATIVE
Nitrite: NEGATIVE
Protein, ur: NEGATIVE mg/dL
Specific Gravity, Urine: 1.008 (ref 1.005–1.030)
pH: 7 (ref 5.0–8.0)

## 2022-06-27 LAB — CBC
HCT: 36.1 % (ref 36.0–46.0)
Hemoglobin: 11.4 g/dL — ABNORMAL LOW (ref 12.0–15.0)
MCH: 28.6 pg (ref 26.0–34.0)
MCHC: 31.6 g/dL (ref 30.0–36.0)
MCV: 90.7 fL (ref 80.0–100.0)
Platelets: 292 10*3/uL (ref 150–400)
RBC: 3.98 MIL/uL (ref 3.87–5.11)
RDW: 14.4 % (ref 11.5–15.5)
WBC: 10.3 10*3/uL (ref 4.0–10.5)
nRBC: 0 % (ref 0.0–0.2)

## 2022-06-27 LAB — LIPASE, BLOOD: Lipase: 43 U/L (ref 11–51)

## 2022-06-27 MED ORDER — IOHEXOL 300 MG/ML  SOLN
100.0000 mL | Freq: Once | INTRAMUSCULAR | Status: AC | PRN
  Administered 2022-06-27: 100 mL via INTRAVENOUS

## 2022-06-27 MED ORDER — FENTANYL CITRATE PF 50 MCG/ML IJ SOSY
50.0000 ug | PREFILLED_SYRINGE | Freq: Once | INTRAMUSCULAR | Status: AC
  Administered 2022-06-27: 50 ug via INTRAVENOUS
  Filled 2022-06-27: qty 1

## 2022-06-27 NOTE — ED Notes (Signed)
Patient transported to CT 

## 2022-06-27 NOTE — ED Triage Notes (Signed)
Pt with c/o lower abdominal pain that started around 1 am. Pt states she had abdominal surgery on January 28th 2024 for perforated colon.

## 2022-06-27 NOTE — ED Provider Notes (Signed)
[facility] Provider Note   CSN: [id number] Arrival date & time: 06/27/22  0255     History  Chief Complaint  Patient presents with   Abdominal Pain    GEOVANNA ROCKMORE is a 78 y.o. female.  HPI     This is a 77 year old female who presents with abdominal pain.  Recent history of perforation secondary to diverticulitis.  She had a diverting colostomy.  Patient reports that this morning around 1 AM she developed some crampy lower abdominal pain.  It concerned her given her recent history.  She felt well prior to that.  No nausea, vomiting.  She has not noted any changes in her ostomy output.  She has not had any fevers.  Denies urinary symptoms.  Home Medications Prior to Admission medications   Medication Sig Start Date End Date Taking? Authorizing Provider  ALPRAZolam (XANAX) 0.25 MG tablet Take 0.25 mg by mouth 2 (two) times daily. For anxiety    [provider]  aspirin EC 81 MG tablet Take 81 mg by mouth daily.    [provider]  docusate sodium (COLACE) 100 MG capsule Take 1 capsule (100 mg total) by mouth 2 (two) times daily. 08/02/14   Rogene Houston, MD  glucosamine-chondroitin 500-400 MG tablet Take 2 tablets by mouth daily.    [provider]  levothyroxine (SYNTHROID, LEVOTHROID) 50 MCG tablet Take 50 mcg by mouth daily before breakfast.  02/17/15   [provider]  Multiple Vitamins-Minerals (HAIR/SKIN/NAILS/BIOTIN) TABS Take by mouth daily.    [provider]  Multiple Vitamins-Minerals (OCUVITE ADULT 50+) CAPS Take by mouth daily.    [provider]  Omega-3 Fatty Acids (FISH OIL) 1200 MG CAPS Take 1 capsule by mouth daily.    [provider]  OVER THE COUNTER MEDICATION Calcium 600mg  2 daily    [provider]  polyethylene glycol (MIRALAX / GLYCOLAX) 17 g packet Take 17 g by mouth at bedtime. For constipation 11/28/18   Rogene Houston, MD  Probiotic  Product (ALIGN) 4 MG CAPS Take 1 capsule by mouth daily. 01/18/12   Kathie Dike, MD  pyridOXINE (VITAMIN B-6) 100 MG tablet Take 100 mg by mouth daily.    [provider]  vitamin A 8000 UNIT capsule Take 8,000 Units by mouth daily.    [provider]  vitamin C (ASCORBIC ACID) 500 MG tablet Take 500 mg by mouth daily.    [provider]  vitamin E 400 UNIT capsule Take 400 Units by mouth daily.    [provider]  vitamin k 100 MCG tablet Take 100 mcg by mouth daily.     [provider]      Allergies    Doxycycline and Metronidazole    Review of Systems   Review of Systems  Constitutional:  Negative for fever.  Gastrointestinal:  Positive for abdominal pain. Negative for nausea and vomiting.  All other systems reviewed and are negative.   Physical Exam Updated Vital Signs BP 138/66 (BP Location: Right Arm)   Pulse 64   Temp 97.9 F (36.6 C) (Oral)   Resp 18   Ht 1.575 m (5\' 2" )   Wt 47 kg   SpO2 100%   BMI 18.95 kg/m  Physical Exam Vitals and nursing note reviewed.  Constitutional:      Appearance: She is well-developed. She is not ill-appearing.  HENT:     Head: Normocephalic and atraumatic.  Eyes:  Pupils: Pupils are equal, round, and reactive to light.  Cardiovascular:     Rate and Rhythm: Normal rate and regular rhythm.     Heart sounds: Normal heart sounds.  Pulmonary:     Effort: Pulmonary effort is normal. No respiratory distress.     Breath sounds: No wheezing.  Abdominal:     General: Bowel sounds are normal.     Palpations: Abdomen is soft.     Tenderness: There is generalized abdominal tenderness.     Comments: Mild generalized tenderness to palpation, no rebound or guarding, stoma noted left mid abdomen, midline incision well-healing, no active drainage  Musculoskeletal:     Cervical back: Neck supple.  Skin:    General: Skin is warm and dry.  Neurological:     Mental Status: She is alert and  oriented to person, place, and time.  Psychiatric:        Mood and Affect: Mood normal.     ED Results / Procedures / Treatments   Labs (all labs ordered are listed, but only abnormal results are displayed) Labs Reviewed  COMPREHENSIVE METABOLIC PANEL - Abnormal; Notable for the following components:      Result Value   Potassium 3.4 (*)    Chloride 97 (*)    Glucose, Bld 134 (*)    All other components within normal limits  CBC - Abnormal; Notable for the following components:   Hemoglobin 11.4 (*)    All other components within normal limits  URINALYSIS, W/ REFLEX TO CULTURE (INFECTION SUSPECTED) - Abnormal; Notable for the following components:   Ketones, ur 5 (*)    All other components within normal limits  LIPASE, BLOOD    EKG None  Radiology CT ABDOMEN PELVIS W CONTRAST  Result Date: 06/27/2022 CLINICAL DATA:  Acute, nonlocalized abdominal pain that began at 1 a.m. EXAM: CT ABDOMEN AND PELVIS WITH CONTRAST TECHNIQUE: Multidetector CT imaging of the abdomen and pelvis was performed using the standard protocol following bolus administration of intravenous contrast. RADIATION DOSE REDUCTION: This exam was performed according to the departmental dose-optimization program which includes automated exposure control, adjustment of the mA and/or kV according to patient size and/or use of iterative reconstruction technique. CONTRAST:  17mL OMNIPAQUE IOHEXOL 300 MG/ML  SOLN COMPARISON:  05/09/2022 FINDINGS: Lower chest:  No acute finding. Hepatobiliary: No focal liver abnormality.Cholelithiasis. No evidence of biliary obstruction or inflammation. Pancreas: Unremarkable. Spleen: Unremarkable. Adrenals/Urinary Tract: Negative adrenals. No hydronephrosis or stone. Unremarkable bladder. 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 Inflammation in the pelvis is improved with no significant collection seen (tiny low-density at the colectomy  margin). There are a few loops of dilated proximal small bowel compared to distal small bowel, measuring up to 3 cm in diameter, without definite transition point. Vascular/Lymphatic: No acute vascular finding. No mass or adenopathy. Reproductive:Hysterectomy Other: No ascites or pneumoperitoneum. Musculoskeletal: Ordinary degenerative changes without acute finding IMPRESSION: 1. A few fluid-filled proximal small bowel loops could be physiologic or from mild/partial obstruction. 2. Improved inflammation in the pelvis. No significant collection or signs of perforation. Electronically Signed   By: Jorje Guild M.D.   On: 06/27/2022 05:16    Procedures Procedures    Medications Ordered in ED Medications  fentaNYL (SUBLIMAZE) injection 50 mcg (50 mcg Intravenous Given 06/27/22 0329)  iohexol (OMNIPAQUE) 300 MG/ML solution 100 mL (100 mLs Intravenous Contrast Given 06/27/22 0438)    ED Course/ Medical Decision Making/ A&P  Medical Decision Making Amount and/or Complexity of Data Reviewed Radiology: ordered.  Risk Prescription drug management.   This patient presents to the ED for concern of abdominal pain, this involves an extensive number of treatment options, and is a complaint that carries with it a high risk of complications and morbidity.  I considered the following differential and admission for this acute, potentially life threatening condition.  The differential diagnosis includes gastritis, gastroenteritis, colitis, SBO, recurrent diverticulitis, UTI  MDM:    This is a 77 year old female who presents with abdominal pain.  Recent complicated history with perforation related to diverticulitis and diverting colostomy.  She is overall nontoxic and vital signs are reassuring.  She has some mild tenderness in the lower abdomen.  Labs obtained and reviewed.  No significant metabolic derangements.  No leukocytosis.  Given recent complicated history, CT scan was  obtained.  CT scan shows some mild fluid-filled proximal small bowel which may be physiologic versus early SBO.  On recheck, she is pain-free.  Her abdominal exam is benign.  Given absence of nausea and vomiting and reassuring exam, would favor physiologic.  Discussed this with the patient and her daughter.  Will plan to discharge home.  She will follow-up with Dr. Constance Haw.  She was given strict return precautions.  (Labs, imaging, consults)  Labs: I Ordered, and personally interpreted labs.  The pertinent results include: CBC, CMP, lipase, urinalysis  Imaging Studies ordered: I ordered imaging studies including CT abdomen I independently visualized and interpreted imaging. I agree with the radiologist interpretation  Additional history obtained from daughter at bedside external records from outside source obtained and reviewed including surgical history and recent operative note  Cardiac Monitoring: The patient was maintained on a cardiac monitor.  If on the cardiac monitor, I personally viewed and interpreted the cardiac monitored which showed an underlying rhythm of: Sinus rhythm  Reevaluation: After the interventions noted above, I reevaluated the patient and found that they have :improved  Social Determinants of Health:  lives independently  Disposition: Discharge  Co morbidities that complicate the patient evaluation  Past Medical History:  Diagnosis Date   Abdominal abscess    Anxiety    Arthritis    Bowel obstruction (HCC)    Multiple   Complication of anesthesia    trouble waking up with colonoscopy2007   Degenerative arthritis of lumbar spine    GERD (gastroesophageal reflux disease)    Hot flashes, menopausal    Intra-abdominal abscess post-procedure    Malnutrition (HCC)    Osteoporosis    Rectal prolapse    Vaginal prolapse      Medicines Meds ordered this encounter  Medications   fentaNYL (SUBLIMAZE) injection 50 mcg   iohexol (OMNIPAQUE) 300 MG/ML  solution 100 mL    I have reviewed the patients home medicines and have made adjustments as needed  Problem List / ED Course: Problem List Items Addressed This Visit   None Visit Diagnoses     Lower abdominal pain    -  Primary                   Final Clinical Impression(s) / ED Diagnoses Final diagnoses:  Lower abdominal pain    Rx / DC Orders ED Discharge Orders     None         Merryl Hacker, MD 06/27/22 [phone]

## 2022-06-27 NOTE — Discharge Instructions (Signed)
You were seen today for abdominal pain.  Your workup today is largely reassuring.  You do have some fluid-filled bowel which can be normal.  Monitor your symptoms closely.  If you have any new or worsening symptoms, you should be reevaluated.  Follow-up with your surgeon.

## 2022-07-05 ENCOUNTER — Telehealth: Payer: Self-pay | Admitting: *Deleted

## 2022-07-05 DIAGNOSIS — R198 Other specified symptoms and signs involving the digestive system and abdomen: Secondary | ICD-10-CM

## 2022-07-05 DIAGNOSIS — Z933 Colostomy status: Secondary | ICD-10-CM

## 2022-07-05 NOTE — Telephone Encounter (Signed)
Patient seen in office to request extension on [facility] services for ostomy care.   States that she has returned to her home alone on 07/05/2022 from staying with her niece. States that [facility] SN is set to discharge on 07/14/2022. Requested to extend [facility] SN for a few weeks to assist her with ostomy care as she does not feel confident that she can change the ostomy by herself.   Call placed to [facility] [phone]~ telephone. Discussed with Eliezer Lofts, PT Manager as [facility] SN manager is out of the office. Reports that message will be passed along to clinical nursing. Also advised that usually cases cannot be extended unless there is a new skill to be taught.   Writer also recommended ostomy clinic for further education. Patient agreeable. Orders placed.

## 2022-07-06 ENCOUNTER — Encounter (HOSPITAL_COMMUNITY): Payer: Self-pay

## 2022-07-06 ENCOUNTER — Other Ambulatory Visit: Payer: Self-pay

## 2022-07-06 ENCOUNTER — Encounter (HOSPITAL_COMMUNITY)
Admission: RE | Admit: 2022-07-06 | Discharge: 2022-07-06 | Disposition: A | Discharge status: Home / Self Care | Payer: Medicare Other | Source: Ambulatory Visit | Attending: Gastroenterology | Admitting: Gastroenterology

## 2022-07-06 HISTORY — DX: Hypothyroidism, unspecified: E03.9

## 2022-07-06 NOTE — Pre-Procedure Instructions (Signed)
Pre-op phone call done. Patient wants to make sure that " the doctor needs to know that he is going to have to use the baby scope for my procedure". Note placed on the chart.

## 2022-07-08 ENCOUNTER — Encounter (INDEPENDENT_AMBULATORY_CARE_PROVIDER_SITE_OTHER): Payer: Self-pay | Admitting: *Deleted

## 2022-07-08 ENCOUNTER — Ambulatory Visit (HOSPITAL_COMMUNITY): Payer: Medicare Other | Admitting: Anesthesiology

## 2022-07-08 ENCOUNTER — Encounter (HOSPITAL_COMMUNITY): Payer: Self-pay | Admitting: Gastroenterology

## 2022-07-08 ENCOUNTER — Ambulatory Visit (HOSPITAL_COMMUNITY)
Admission: RE | Admit: 2022-07-08 | Discharge: 2022-07-08 | Disposition: A | Discharge status: Home / Self Care | Payer: Medicare Other | Attending: Gastroenterology | Admitting: Gastroenterology

## 2022-07-08 ENCOUNTER — Ambulatory Visit (HOSPITAL_BASED_OUTPATIENT_CLINIC_OR_DEPARTMENT_OTHER): Payer: Medicare Other | Admitting: Anesthesiology

## 2022-07-08 ENCOUNTER — Encounter (HOSPITAL_COMMUNITY)
Admission: RE | Disposition: A | Discharge status: Home / Self Care | Payer: Self-pay | Source: Home / Self Care | Attending: Gastroenterology

## 2022-07-08 DIAGNOSIS — Z933 Colostomy status: Secondary | ICD-10-CM | POA: Diagnosis not present

## 2022-07-08 DIAGNOSIS — Z681 Body mass index (BMI) 19 or less, adult: Secondary | ICD-10-CM | POA: Diagnosis not present

## 2022-07-08 DIAGNOSIS — K56609 Unspecified intestinal obstruction, unspecified as to partial versus complete obstruction: Secondary | ICD-10-CM | POA: Insufficient documentation

## 2022-07-08 DIAGNOSIS — M810 Age-related osteoporosis without current pathological fracture: Secondary | ICD-10-CM | POA: Insufficient documentation

## 2022-07-08 DIAGNOSIS — D122 Benign neoplasm of ascending colon: Secondary | ICD-10-CM | POA: Insufficient documentation

## 2022-07-08 DIAGNOSIS — Z79899 Other long term (current) drug therapy: Secondary | ICD-10-CM | POA: Insufficient documentation

## 2022-07-08 DIAGNOSIS — Z1211 Encounter for screening for malignant neoplasm of colon: Secondary | ICD-10-CM | POA: Diagnosis not present

## 2022-07-08 DIAGNOSIS — K648 Other hemorrhoids: Secondary | ICD-10-CM | POA: Insufficient documentation

## 2022-07-08 DIAGNOSIS — M1990 Unspecified osteoarthritis, unspecified site: Secondary | ICD-10-CM | POA: Diagnosis not present

## 2022-07-08 DIAGNOSIS — K623 Rectal prolapse: Secondary | ICD-10-CM | POA: Insufficient documentation

## 2022-07-08 DIAGNOSIS — Z939 Artificial opening status, unspecified: Secondary | ICD-10-CM

## 2022-07-08 DIAGNOSIS — K219 Gastro-esophageal reflux disease without esophagitis: Secondary | ICD-10-CM | POA: Insufficient documentation

## 2022-07-08 DIAGNOSIS — K5730 Diverticulosis of large intestine without perforation or abscess without bleeding: Secondary | ICD-10-CM | POA: Insufficient documentation

## 2022-07-08 DIAGNOSIS — F419 Anxiety disorder, unspecified: Secondary | ICD-10-CM | POA: Insufficient documentation

## 2022-07-08 DIAGNOSIS — E46 Unspecified protein-calorie malnutrition: Secondary | ICD-10-CM | POA: Diagnosis not present

## 2022-07-08 DIAGNOSIS — K631 Perforation of intestine (nontraumatic): Secondary | ICD-10-CM

## 2022-07-08 HISTORY — PX: POLYPECTOMY: SHX5525

## 2022-07-08 HISTORY — PX: COLONOSCOPY WITH PROPOFOL: SHX5780

## 2022-07-08 LAB — HM COLONOSCOPY

## 2022-07-08 SURGERY — COLONOSCOPY WITH PROPOFOL
Anesthesia: General

## 2022-07-08 MED ORDER — LACTATED RINGERS IV SOLN: INTRAVENOUS | Status: DC

## 2022-07-08 MED ORDER — EPHEDRINE 5 MG/ML INJ
INTRAVENOUS | Status: AC
  Filled 2022-07-08: qty 5

## 2022-07-08 MED ORDER — PHENYLEPHRINE 80 MCG/ML (10ML) SYRINGE FOR IV PUSH (FOR BLOOD PRESSURE SUPPORT)
PREFILLED_SYRINGE | INTRAVENOUS | Status: AC
  Filled 2022-07-08: qty 10

## 2022-07-08 MED ORDER — PHENYLEPHRINE 80 MCG/ML (10ML) SYRINGE FOR IV PUSH (FOR BLOOD PRESSURE SUPPORT)
PREFILLED_SYRINGE | INTRAVENOUS | Status: DC | PRN
  Administered 2022-07-08: 80 ug via INTRAVENOUS
  Administered 2022-07-08 (×3): 160 ug via INTRAVENOUS
  Administered 2022-07-08: 80 ug via INTRAVENOUS

## 2022-07-08 MED ORDER — DEXMEDETOMIDINE HCL IN NACL 80 MCG/20ML IV SOLN
INTRAVENOUS | Status: DC | PRN
  Administered 2022-07-08: 8 ug via BUCCAL
  Administered 2022-07-08: 12 ug via BUCCAL

## 2022-07-08 MED ORDER — LIDOCAINE HCL (CARDIAC) PF 100 MG/5ML IV SOSY
PREFILLED_SYRINGE | INTRAVENOUS | Status: DC | PRN
  Administered 2022-07-08: 50 mg via INTRAVENOUS

## 2022-07-08 MED ORDER — PROPOFOL 500 MG/50ML IV EMUL
INTRAVENOUS | Status: DC | PRN
  Administered 2022-07-08: 150 ug/kg/min via INTRAVENOUS

## 2022-07-08 MED ORDER — PROPOFOL 10 MG/ML IV BOLUS
INTRAVENOUS | Status: DC | PRN
  Administered 2022-07-08: 100 mg via INTRAVENOUS
  Administered 2022-07-08 (×2): 50 mg via INTRAVENOUS

## 2022-07-08 MED ORDER — EPHEDRINE SULFATE-NACL 50-0.9 MG/10ML-% IV SOSY
PREFILLED_SYRINGE | INTRAVENOUS | Status: DC | PRN
  Administered 2022-07-08: 10 mg via INTRAVENOUS
  Administered 2022-07-08: 5 mg via INTRAVENOUS

## 2022-07-08 NOTE — Anesthesia Preprocedure Evaluation (Signed)
Anesthesia Evaluation  Patient identified by MRN, date of birth, ID band Patient awake    Reviewed: Allergy & Precautions, H&P , NPO status , Patient's Chart, lab work & pertinent test results  History of Anesthesia Complications (+) PROLONGED EMERGENCE and history of anesthetic complications  Airway Mallampati: I  TM Distance: >3 FB Neck ROM: Full    Dental  (+) Dental Advisory Given, Teeth Intact   Pulmonary neg pulmonary ROS   Pulmonary exam normal breath sounds clear to auscultation       Cardiovascular negative cardio ROS Normal cardiovascular exam Rhythm:Regular Rate:Normal  09-May-2022 19:28:15 [facility] [facility]-AP-ER ROUTINE RECORD [id number] (52 yr) Female Caucasian Vent. rate 97 BPM PR interval 137 ms QRS duration 68 ms QT/QTcB 388/493 ms P-R-T axes 66 63 259 Sinus rhythm Ventricular premature complex Low voltage, precordial leads Anteroseptal infarct, old Borderline repolarization abnormality Confirmed by Orpah Greek [phone]) on 05/11/2022 8:20:11 AM   Neuro/Psych  PSYCHIATRIC DISORDERS Anxiety     negative neurological ROS     GI/Hepatic Neg liver ROS,GERD  ,,  Endo/Other  Hypothyroidism    Renal/GU negative Renal ROS  negative genitourinary   Musculoskeletal  (+) Arthritis , Osteoarthritis,    Abdominal   Peds negative pediatric ROS (+)  Hematology negative hematology ROS (+)   Anesthesia Other Findings   Reproductive/Obstetrics negative OB ROS                             Anesthesia Physical Anesthesia Plan  ASA: 2  Anesthesia Plan: General   Post-op Pain Management: Minimal or no pain anticipated   Induction: Intravenous  PONV Risk Score and Plan: 1 and Propofol infusion  Airway Management Planned: Nasal Cannula and Natural Airway  Additional Equipment:   Intra-op Plan:   Post-operative Plan:   Informed Consent: I have reviewed  the patients History and Physical, chart, labs and discussed the procedure including the risks, benefits and alternatives for the proposed anesthesia with the patient or authorized representative who has indicated his/her understanding and acceptance.     Dental advisory given  Plan Discussed with: CRNA and Surgeon  Anesthesia Plan Comments:        Anesthesia Quick Evaluation

## 2022-07-08 NOTE — Anesthesia Postprocedure Evaluation (Signed)
Anesthesia Post Note  Patient: Yvette Branch  Procedure(s) Performed: COLONOSCOPY WITH PROPOFOL POLYPECTOMY  Patient location during evaluation: Phase II Anesthesia Type: General Level of consciousness: awake and alert and oriented Pain management: pain level controlled Vital Signs Assessment: post-procedure vital signs reviewed and stable Respiratory status: spontaneous breathing, nonlabored ventilation and respiratory function stable Cardiovascular status: blood pressure returned to baseline and stable Postop Assessment: no apparent nausea or vomiting Anesthetic complications: no  No notable events documented.   Last Vitals:  Vitals:   07/08/22 1056 07/08/22 1200  BP: (!) 129/49 (!) 97/43  Pulse: 74 82  Resp: 12 12  Temp: 36.4 C 36.7 C  SpO2: 100% 97%    Last Pain:  Vitals:   07/08/22 1203  TempSrc:   PainSc: 0-No pain                 Cecil Bixby C Tramar Brueckner

## 2022-07-08 NOTE — Interval H&P Note (Signed)
History and Physical Interval Note:  07/08/2022 10:46 AM  Yvette Branch  has presented today for surgery, with the diagnosis of RECTAL PROLAPSE.  The various methods of treatment have been discussed with the patient and family. After consideration of risks, benefits and other options for treatment, the patient has consented to  Procedure(s) with comments: COLONOSCOPY WITH PROPOFOL (N/A) - 1230pm, asa 3, via ostomy/rectal stump as a surgical intervention.  The patient's history has been reviewed, patient examined, no change in status, stable for surgery.  I have reviewed the patient's chart and labs.  Questions were answered to the patient's satisfaction.     [name]

## 2022-07-08 NOTE — Anesthesia Procedure Notes (Signed)
Date/Time: 07/08/2022 11:27 AM  Performed by: Orlie Dakin, CRNAPre-anesthesia Checklist: Patient identified, Emergency Drugs available, Suction available and Patient being monitored Patient Re-evaluated:Patient Re-evaluated prior to induction Oxygen Delivery Method: Nasal cannula Induction Type: IV induction Placement Confirmation: positive ETCO2

## 2022-07-08 NOTE — Op Note (Signed)
[facility] Patient Name: Yvette Branch Procedure Date: 07/08/2022 11:15 AM MRN: [id number] Date of Birth: [birth year] Attending MD: Maylon Peppers , , [id number] CSN: [id number] Age: 77 Admit Type: Outpatient Procedure:                Colonoscopy Indications:              Screening for colorectal malignant neoplasm,                            Incidental - Preoperative assessment Providers:                Maylon Peppers, Rosina Lowenstein, RN, Aram Candela Referring MD:             Lanell Matar. [place] Medicines:                Monitored Anesthesia Care Complications:            No immediate complications. Estimated Blood Loss:     Estimated blood loss: none. Procedure:                Pre-Anesthesia Assessment:                           - Prior to the procedure, a History and Physical                            was performed, and patient medications, allergies                            and sensitivities were reviewed. The patient's                            tolerance of previous anesthesia was reviewed.                           - The risks and benefits of the procedure and the                            sedation options and risks were discussed with the                            patient. All questions were answered and informed                            consent was obtained.                           - ASA Grade Assessment: III - A patient with severe                            systemic disease.                           After obtaining informed consent, the colonoscope                            was passed under direct vision.  Throughout the                            procedure, the patient's blood pressure, pulse, and                            oxygen saturations were monitored continuously. The                            [id number] scope was introduced through                            the descending colostomy and advanced to the the                             terminal ileum. The colonoscopy was performed                            without difficulty. The patient tolerated the                            procedure well. The quality of the bowel                            preparation was good. Scope In: 11:32:31 AM Scope Out: 11:56:04 AM Scope Withdrawal Time: 0 hours 9 minutes 5 seconds  Total Procedure Duration: 0 hours 23 minutes 33 seconds  Findings:      Initial approach performed through descending colon ostomy, which looked       healthy.      The terminal ileum appeared normal.      A 3 mm polyp was found in the ascending colon. The polyp was sessile.       The polyp was removed with a cold snare. Resection and retrieval were       complete.      A few small-mouthed diverticula were found in the descending colon.      After this evaluation was performed, I withdrew the scope from the       ostomy and advanced it through the anus.      The perianal and digital rectal examinations were normal.      Non-bleeding internal hemorrhoids were found during retroflexion. The       hemorrhoids were small. Impression:               - The examined portion of the ileum was normal.                           - One 3 mm polyp in the ascending colon, removed                            with a cold snare. Resected and retrieved.                           - Diverticulosis in the descending colon.                           -  Non-bleeding internal hemorrhoids. Moderate Sedation:      Per Anesthesia Care Recommendation:           - Discharge patient to home (ambulatory).                           - Resume previous diet.                           - Await pathology results.                           - Repeat colonoscopy is not recommended due to                            current age (44 years or older) for screening                            purposes.                           - Follow up with general surgery (Dr. Constance Haw). Procedure Code(s):        ---  Professional ---                           [phone], Colonoscopy through stoma; with removal of                            tumor(s), polyp(s), or other lesion(s) by snare                            technique Diagnosis Code(s):        --- Professional ---                           Z12.11, Encounter for screening for malignant                            neoplasm of colon                           D12.2, Benign neoplasm of ascending colon                           K64.8, Other hemorrhoids                           K57.30, Diverticulosis of large intestine without                            perforation or abscess without bleeding CPT copyright 2022 American Medical Association. All rights reserved. The codes documented in this report are preliminary and upon coder review may  be revised to meet current compliance requirements. Maylon Peppers, MD Maylon Peppers,  07/08/2022 12:07:26 PM This report has been signed electronically. Number of Addenda: 0

## 2022-07-08 NOTE — Transfer of Care (Signed)
Immediate Anesthesia Transfer of Care Note  Patient: Yvette Branch  Procedure(s) Performed: COLONOSCOPY WITH PROPOFOL POLYPECTOMY  Patient Location: Short Stay  Anesthesia Type:General  Level of Consciousness: awake  Airway & Oxygen Therapy: Patient Spontanous Breathing  Post-op Assessment: Report given to RN and Post -op Vital signs reviewed and stable  Post vital signs: Reviewed and stable  Last Vitals:  Vitals Value Taken Time  BP 97/43 07/08/22 1200  Temp 36.7 C 07/08/22 1200  Pulse 82 07/08/22 1200  Resp 12 07/08/22 1200  SpO2 97 % 07/08/22 1200    Last Pain:  Vitals:   07/08/22 1203  TempSrc:   PainSc: 0-No pain         Complications: No notable events documented.

## 2022-07-08 NOTE — Discharge Instructions (Signed)
You are being discharged to home.  Resume your previous diet.  We are waiting for your pathology results.  Your physician has indicated that a repeat colonoscopy is not recommended due to your current age (59 years or older) for screening purposes.  Follow up with general surgery (Dr. Constance Haw).

## 2022-07-11 LAB — SURGICAL PATHOLOGY

## 2022-07-19 ENCOUNTER — Encounter (HOSPITAL_COMMUNITY): Payer: Self-pay | Admitting: Gastroenterology

## 2022-07-21 ENCOUNTER — Encounter: Payer: Self-pay | Admitting: General Surgery

## 2022-07-21 ENCOUNTER — Ambulatory Visit (INDEPENDENT_AMBULATORY_CARE_PROVIDER_SITE_OTHER): Payer: Medicare Other | Admitting: General Surgery

## 2022-07-21 VITALS — BP 121/70 | HR 63 | Temp 97.9°F | Resp 12 | Ht 62.0 in | Wt 101.0 lb

## 2022-07-21 DIAGNOSIS — Z933 Colostomy status: Secondary | ICD-10-CM

## 2022-07-21 NOTE — Patient Instructions (Signed)
Continue getting stronger. Will see you in a month to talk about reversal.

## 2022-07-21 NOTE — Progress Notes (Unsigned)
[facility]  Future Appointments  Date Time Provider Department Center  08/18/2022  2:45 PM [name], MD [facility] None  09/23/2022 11:45 AM Carlan, Jeral Pinch, NP [facility] None

## 2022-08-18 ENCOUNTER — Ambulatory Visit (INDEPENDENT_AMBULATORY_CARE_PROVIDER_SITE_OTHER): Payer: Medicare Other | Admitting: General Surgery

## 2022-08-18 ENCOUNTER — Encounter: Payer: Self-pay | Admitting: General Surgery

## 2022-08-18 VITALS — BP 113/66 | HR 57 | Temp 97.9°F | Resp 14 | Ht 62.0 in | Wt 99.0 lb

## 2022-08-18 DIAGNOSIS — Z933 Colostomy status: Secondary | ICD-10-CM

## 2022-08-18 MED ORDER — ERYTHROMYCIN BASE 500 MG PO TABS: 1000.0000 mg | ORAL_TABLET | ORAL | 0 refills | Status: DC

## 2022-08-18 MED ORDER — NEOMYCIN SULFATE 500 MG PO TABS: 1000.0000 mg | ORAL_TABLET | ORAL | 0 refills | Status: DC

## 2022-08-18 NOTE — Patient Instructions (Signed)
Colon Preparation:   Buy from the Store: Miralax bottle [phone]).  Gatorade 64 oz (not red). Dulcolax tablets.  Enema- fleet X2  The Day Prior to Surgery: Take 4 ducolax tablets at 7am with water. Do an enema through your rectum at 8AM. Drink plenty of clear liquids all day to avoid dehydration, no solid food.    Mix the bottle of Miralax and 64 oz of Gatorade and drink this mixture starting at 10am.  Drink it gradually over the next few hours, 8 ounces every 15-30 minutes until it is gone. Finish this by 2pm.  Repeat an enema at 2pm.  Take 2 neomycin 500mg  tablets and 2 metronidazole 500mg  tablets at 2 pm. Take 2 neomycin 500mg  tablets and 2 metronidazole 500mg  tablets at 3pm. Take 2 neomycin 500mg  tablets and 2 metronidazole 500mg  tablets at 10pm.    Do not eat or drink anything after midnight the night before your surgery.  Do not eat or drink anything that morning, and take medications as instructed by the hospital staff on your preoperative visit.

## 2022-08-23 NOTE — Progress Notes (Signed)
[facility] History and Physical  Chief Complaint   Post-op Follow-up     Yvette Branch is a 77 y.o. female.  HPI: Patient doing well and not having issues. She has had her colonoscopy and has a stump that is about 10cm in length per Dr. Castaneda's report to me. She is doing well and feeling well. She is eating and having good ostomy output. She is ready to get reversed. She is here with her niece. She will be about 6 months out from her surgery.   Past Medical History:  Diagnosis Date   Abdominal abscess    Anxiety    Arthritis    Bowel obstruction (HCC)    Multiple   Complication of anesthesia    trouble waking up with colonoscopy2007   Degenerative arthritis of lumbar spine    GERD (gastroesophageal reflux disease)    Hot flashes, menopausal    Hypothyroidism    Intra-abdominal abscess post-procedure    Malnutrition (HCC)    Osteoporosis    Rectal prolapse    Vaginal prolapse     Past Surgical History:  Procedure Laterality Date   ABDOMINAL HYSTERECTOMY     ABDOMINAL SURGERY     bowel obstruction     COLECTOMY WITH COLOSTOMY CREATION/HARTMANN PROCEDURE N/A 05/09/2022   Procedure: COLECTOMY WITH COLOSTOMY CREATION/HARTMANN PROCEDURE;  Surgeon: Dorthy Hustead C, MD;  Location: [facility];  Service: General;  Laterality: N/A;   COLONOSCOPY N/A 08/02/2014   Procedure: COLONOSCOPY;  Surgeon: Najeeb U Rehman, MD;  Location: [facility];  Service: Endoscopy;  Laterality: N/A;  1240   COLONOSCOPY WITH PROPOFOL N/A 07/08/2022   Procedure: COLONOSCOPY WITH PROPOFOL;  Surgeon: Castaneda Mayorga, Daniel, MD;  Location: [facility];  Service: Gastroenterology;  Laterality: N/A;  1230pm, asa 3, via ostomy/rectal stump   POLYPECTOMY  07/08/2022   Procedure: POLYPECTOMY;  Surgeon: Castaneda Mayorga, Daniel, MD;  Location: [facility];  Service: Gastroenterology;;   TONSILLECTOMY      Family History  Problem Relation Age of Onset   Cancer Mother        Throat    Cancer Sister        Leukemia    Social History   Tobacco Use   Smoking status: Never    Passive exposure: Past   Smokeless tobacco: Never  Vaping Use   Vaping Use: Never used  Substance Use Topics   Alcohol use: Yes    Alcohol/week: 0.0 standard drinks of alcohol    Comment: Occasional wine.   Drug use: No    Medications: I have reviewed the patient's current medications. Allergies as of 08/18/2022       Reactions   Doxycycline Other (See Comments)   Bad vision   Metronidazole Hives, Diarrhea, Rash   Numbness in face        Medication List        Accurate as of Aug 18, 2022 11:59 PM. If you have any questions, ask your nurse or doctor.          STOP taking these medications    ascorbic acid 500 MG tablet Commonly known as: VITAMIN C Stopped by: Audwin Semper C Zanaria Morell, MD   GLUCOSAMINE-CHONDROITIN PO Stopped by: Lauraann Missey C Hamdan Toscano, MD   saline Gel Stopped by: Kristene Liberati C Davaughn Hillyard, MD   vitamin A 8000 UNIT capsule Stopped by: Tremon Sainvil C Liboria Putnam, MD   vitamin E 180 MG (400 UNITS) capsule Stopped by: Micha Erck C Johnjoseph Rolfe, MD   vitamin k 100   MCG tablet Stopped by: Mayzie Caughlin C Ebone Alcivar, MD       TAKE these medications    Align 4 MG Caps Take 1 capsule by mouth daily.   ALPRAZolam 0.25 MG tablet Commonly known as: XANAX Take 0.25 mg by mouth 2 (two) times daily as needed for anxiety.   aspirin EC 81 MG tablet Take 81 mg by mouth daily.   CALCIUM 600 + D PO Take 2 tablets by mouth daily.   docusate sodium 100 MG capsule Commonly known as: COLACE Take 1 capsule (100 mg total) by mouth 2 (two) times daily. What changed: when to take this   erythromycin base 500 MG tablet Commonly known as: E-MYCIN Take 2 tablets (1,000 mg total) by mouth as directed. Take 2 erythromycin 500mg tablets at 2 pm, 3pm, and 10 pm the day before surgery. Started by: Evelia Waskey C Tyrice Hewitt, MD   Fish Oil 1200 MG Caps Take 1 capsule by mouth daily.   Hair/Skin/Nails/Biotin  Tabs Take 1 tablet by mouth daily.   Ocuvite Adult 50+ Caps Take 1 capsule by mouth daily.   levothyroxine 50 MCG tablet Commonly known as: SYNTHROID Take 50 mcg by mouth daily before breakfast.   Magnesium 400 MG Tabs Take 400 mg by mouth daily.   neomycin 500 MG tablet Commonly known as: MYCIFRADIN Take 2 tablets (1,000 mg total) by mouth as directed. Take 2 neomycin 500mg tablets at 2 pm, 3pm, and 10 pm the day before surgery. Started by: Joanne Salah C Samson Ralph, MD   polyethylene glycol 17 g packet Commonly known as: MIRALAX / GLYCOLAX Take 17 g by mouth at bedtime. For constipation   pyridOXINE 100 MG tablet Commonly known as: VITAMIN B6 Take 100 mg by mouth daily.   THERATEARS EXTRA OP Place 1 drop into both eyes 4 (four) times daily.         ROS:  A comprehensive review of systems was negative except for: Gastrointestinal: positive for ostomy in place  Blood pressure 113/66, pulse (!) 57, temperature 97.9 F (36.6 C), temperature source Oral, resp. rate 14, height 5' 2" (1.575 m), weight 99 lb (44.9 kg), SpO2 98 %. Physical Exam Vitals reviewed.  HENT:     Head: Normocephalic.     Nose: Nose normal.  Eyes:     Extraocular Movements: Extraocular movements intact.  Cardiovascular:     Rate and Rhythm: Normal rate and regular rhythm.  Pulmonary:     Effort: Pulmonary effort is normal.     Breath sounds: Normal breath sounds.  Abdominal:     General: There is no distension.     Palpations: Abdomen is soft.     Tenderness: There is no abdominal tenderness.     Comments: Ostomy in place, stool in bag, midline healed   Musculoskeletal:        General: Normal range of motion.  Skin:    General: Skin is warm.  Neurological:     General: No focal deficit present.     Mental Status: She is alert and oriented to person, place, and time.  Psychiatric:        Mood and Affect: Mood normal.        Behavior: Behavior normal.        Judgment: Judgment normal.      Results: None   Assessment & Plan:  Chantele S Germain is a 77 y.o. 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 She is doing well and ready for reversal.   -Discussed   surgery and risk of bleeding, infection, risk of leak, risk of injury to other organs and ureter.  -Discussed post operative course and staying in the hospital.  -Discussed potential need for blood   All questions were answered to the satisfaction of the patient and family.  Colon Preparation:  Buy from the Store: Miralax bottle (288g).  Gatorade 64 oz (not red). Dulcolax tablets.   The Day Prior to Surgery: Take 4 ducolax tablets at 7am with water. Do an enema through your rectum at 8AM. Drink plenty of clear liquids all day to avoid dehydration, no solid food.    Mix the bottle of Miralax and 64 oz of Gatorade and drink this mixture starting at 10am.  Drink it gradually over the next few hours, 8 ounces every 15-30 minutes until it is gone. Finish this by 2pm.  Repeat an enema at 2pm.  Take 2 neomycin 500mg tablets and 2 metronidazole 500mg tablets at 2 pm. Take 2 neomycin 500mg tablets and 2 metronidazole 500mg tablets at 3pm. Take 2 neomycin 500mg tablets and 2 metronidazole 500mg tablets at 10pm.    Do not eat or drink anything after midnight the night before your surgery.  Do not eat or drink anything that morning, and take medications as instructed by the hospital staff on your preoperative visit.     Shaterica Mcclatchy C Nivedita Mirabella 08/23/2022, 1:58 PM   

## 2022-08-23 NOTE — H&P (Signed)
[facility] History and Physical  Chief Complaint   Post-op Follow-up     Yvette Branch is a 77 y.o. female.  HPI: Patient doing well and not having issues. She has had her colonoscopy and has a stump that is about 10cm in length per Dr. Wilburt Finlay report to me. She is doing well and feeling well. She is eating and having good ostomy output. She is ready to get reversed. She is here with her niece. She will be about 6 months out from her surgery.   Past Medical History:  Diagnosis Date   Abdominal abscess    Anxiety    Arthritis    Bowel obstruction (HCC)    Multiple   Complication of anesthesia    trouble waking up with colonoscopy2007   Degenerative arthritis of lumbar spine    GERD (gastroesophageal reflux disease)    Hot flashes, menopausal    Hypothyroidism    Intra-abdominal abscess post-procedure    Malnutrition (HCC)    Osteoporosis    Rectal prolapse    Vaginal prolapse     Past Surgical History:  Procedure Laterality Date   ABDOMINAL HYSTERECTOMY     ABDOMINAL SURGERY     bowel obstruction     COLECTOMY WITH COLOSTOMY CREATION/HARTMANN PROCEDURE N/A 05/09/2022   Procedure: COLECTOMY WITH COLOSTOMY CREATION/HARTMANN PROCEDURE;  Surgeon: [name], MD;  Location: [facility];  Service: General;  Laterality: N/A;   COLONOSCOPY N/A 08/02/2014   Procedure: COLONOSCOPY;  Surgeon: Malissa Hippo, MD;  Location: [facility];  Service: Endoscopy;  Laterality: N/A;  1240   COLONOSCOPY WITH PROPOFOL N/A 07/08/2022   Procedure: COLONOSCOPY WITH PROPOFOL;  Surgeon: Dolores Frame, MD;  Location: [facility];  Service: Gastroenterology;  Laterality: N/A;  1230pm, asa 3, via ostomy/rectal stump   POLYPECTOMY  07/08/2022   Procedure: POLYPECTOMY;  Surgeon: Dolores Frame, MD;  Location: [facility];  Service: Gastroenterology;;   TONSILLECTOMY      Family History  Problem Relation Age of Onset   Cancer Mother        Throat    Cancer Sister        Leukemia    Social History   Tobacco Use   Smoking status: Never    Passive exposure: Past   Smokeless tobacco: Never  Vaping Use   Vaping Use: Never used  Substance Use Topics   Alcohol use: Yes    Alcohol/week: 0.0 standard drinks of alcohol    Comment: Occasional wine.   Drug use: No    Medications: I have reviewed the patient's current medications. Allergies as of 08/18/2022       Reactions   Doxycycline Other (See Comments)   Bad vision   Metronidazole Hives, Diarrhea, Rash   Numbness in face        Medication List        Accurate as of Aug 18, 2022 11:59 PM. If you have any questions, ask your nurse or doctor.          STOP taking these medications    ascorbic acid 500 MG tablet Commonly known as: VITAMIN C Stopped by: [name], MD   GLUCOSAMINE-CHONDROITIN PO Stopped by: [name], MD   saline Gel Stopped by: [name], MD   vitamin A 8000 UNIT capsule Stopped by: [name], MD   vitamin E 180 MG (400 UNITS) capsule Stopped by: [name], MD   vitamin k 100  MCG tablet Stopped by: [name], MD       TAKE these medications    Align 4 MG Caps Take 1 capsule by mouth daily.   ALPRAZolam 0.25 MG tablet Commonly known as: XANAX Take 0.25 mg by mouth 2 (two) times daily as needed for anxiety.   aspirin EC 81 MG tablet Take 81 mg by mouth daily.   CALCIUM 600 + D PO Take 2 tablets by mouth daily.   docusate sodium 100 MG capsule Commonly known as: COLACE Take 1 capsule (100 mg total) by mouth 2 (two) times daily. What changed: when to take this   erythromycin base 500 MG tablet Commonly known as: E-MYCIN Take 2 tablets (1,000 mg total) by mouth as directed. Take 2 erythromycin 500mg  tablets at 2 pm, 3pm, and 10 pm the day before surgery. Started by: [name], MD   Fish Oil 1200 MG Caps Take 1 capsule by mouth daily.   Hair/Skin/Nails/Biotin  Tabs Take 1 tablet by mouth daily.   Ocuvite Adult 50+ Caps Take 1 capsule by mouth daily.   levothyroxine 50 MCG tablet Commonly known as: SYNTHROID Take 50 mcg by mouth daily before breakfast.   Magnesium 400 MG Tabs Take 400 mg by mouth daily.   neomycin 500 MG tablet Commonly known as: MYCIFRADIN Take 2 tablets (1,000 mg total) by mouth as directed. Take 2 neomycin 500mg  tablets at 2 pm, 3pm, and 10 pm the day before surgery. Started by: [name], MD   polyethylene glycol 17 g packet Commonly known as: MIRALAX / GLYCOLAX Take 17 g by mouth at bedtime. For constipation   pyridOXINE 100 MG tablet Commonly known as: VITAMIN B6 Take 100 mg by mouth daily.   THERATEARS EXTRA OP Place 1 drop into both eyes 4 (four) times daily.         ROS:  A comprehensive review of systems was negative except for: Gastrointestinal: positive for ostomy in place  Blood pressure 113/66, pulse (!) 57, temperature 97.9 F (36.6 C), temperature source Oral, resp. rate 14, height 5\' 2"  (1.575 m), weight 99 lb (44.9 kg), SpO2 98 %. Physical Exam Vitals reviewed.  HENT:     Head: Normocephalic.     Nose: Nose normal.  Eyes:     Extraocular Movements: Extraocular movements intact.  Cardiovascular:     Rate and Rhythm: Normal rate and regular rhythm.  Pulmonary:     Effort: Pulmonary effort is normal.     Breath sounds: Normal breath sounds.  Abdominal:     General: There is no distension.     Palpations: Abdomen is soft.     Tenderness: There is no abdominal tenderness.     Comments: Ostomy in place, stool in bag, midline healed   Musculoskeletal:        General: Normal range of motion.  Skin:    General: Skin is warm.  Neurological:     General: No focal deficit present.     Mental Status: She is alert and oriented to person, place, and time.  Psychiatric:        Mood and Affect: Mood normal.        Behavior: Behavior normal.        Judgment: Judgment normal.      Results: None   Assessment & Plan:  Yvette Branch is a 76 y.o. 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 She is doing well and ready for reversal.   -Discussed  surgery and risk of bleeding, infection, risk of leak, risk of injury to other organs and ureter.  -Discussed post operative course and staying in the hospital.  -Discussed potential need for blood   All questions were answered to the satisfaction of the patient and family.  Colon Preparation:  Buy from the Store: Miralax bottle [phone]).  Gatorade 64 oz (not red). Dulcolax tablets.   The Day Prior to Surgery: Take 4 ducolax tablets at 7am with water. Do an enema through your rectum at 8AM. Drink plenty of clear liquids all day to avoid dehydration, no solid food.    Mix the bottle of Miralax and 64 oz of Gatorade and drink this mixture starting at 10am.  Drink it gradually over the next few hours, 8 ounces every 15-30 minutes until it is gone. Finish this by 2pm.  Repeat an enema at 2pm.  Take 2 neomycin 500mg  tablets and 2 metronidazole 500mg  tablets at 2 pm. Take 2 neomycin 500mg  tablets and 2 metronidazole 500mg  tablets at 3pm. Take 2 neomycin 500mg  tablets and 2 metronidazole 500mg  tablets at 10pm.    Do not eat or drink anything after midnight the night before your surgery.  Do not eat or drink anything that morning, and take medications as instructed by the hospital staff on your preoperative visit.     [name] 08/23/2022, 1:58 PM

## 2022-09-01 ENCOUNTER — Other Ambulatory Visit (HOSPITAL_COMMUNITY)
Admission: RE | Admit: 2022-09-01 | Discharge: 2022-09-01 | Disposition: A | Discharge status: Home / Self Care | Payer: Medicare Other | Source: Ambulatory Visit | Attending: General Surgery | Admitting: General Surgery

## 2022-09-02 NOTE — Patient Instructions (Signed)
Yvette Branch  09/02/2022     @PREFPERIOPPHARMACY @   Your procedure is scheduled on  09/10/2022.   Report to [facility] at  0600  A.M.   Call this number if you have problems the morning of surgery:  [phone]  If you experience any cold or flu symptoms such as cough, fever, chills, shortness of breath, etc. between now and your scheduled surgery, please notify us at the above number.   Remember:  Buy from the Store: Miralax bottle (288g).   Gatorade 64 oz (not red).  Dulcolax tablets.      The Day Prior to Surgery: 09/09/2022.  Take 4 ducolax tablets at 7am with water.  Do an enema through your rectum at 8AM. Drink plenty of clear liquids all day to avoid dehydration, no solid food.     Mix the bottle of Miralax and 64 oz of Gatorade and drink this mixture starting at 10am.    Drink it gradually over the next few hours, 8 ounces every 15-30 minutes until it is gone.   Finish this by 2pm.   Repeat an enema at 2pm.   Take 2 neomycin 500mg  tablets and 2  metronidazole 500mg  tablets at 2 pm.  Take 2 neomycin 500mg  tablets and 2 metronidazole 500mg  tablets at 3pm.  Take 2 neomycin 500mg  tablets and 2 metronidazole 500mg  tablets at 10pm.     Drink 2 carb drinks at 10pm.  Continue to drink as much as possible to avoid dehydration.             You may drink clear liquids until  0330 am on 09/10/2022.    Clear liquids allowed are:                    Water, Juice (No red color; non-citric and without pulp; diabetics please choose diet or no sugar options), Carbonated beverages (diabetics please choose diet or no sugar options), Clear Tea (No creamer, milk, or cream, including half & half and powdered creamer), Black Coffee Only (No creamer, milk or cream, including half & half and powdered creamer), Plain Jell-O Only (No red color; diabetics please choose no sugar options), Clear Sports drink (No red color; diabetics please choose diet or no sugar options),  and Plain Popsicles Only (No red color; diabetics please choose no sugar options)        At 0330 am on 09/10/2022, drink 1 carb drink. You can have nothing else to drink after this.    Take these medicines the morning of surgery with A SIP OF WATER               xanax(if needed), levothyroxine.     Do not wear jewelry, make-up or nail polish, including gel polish,  artificial nails, or any other type of covering on natural nails (fingers and  toes).   Do not wear lotions, powders, or perfumes, or deodorant.   Do not shave 48 hours prior to surgery.  Men may shave face and neck.   Do not bring valuables to the hospital.   [facility] is not responsible for any belongings or valuables.  Contacts, dentures or bridgework may not be worn into surgery.  Leave your suitcase in the car.  After surgery it may be brought to your room.  For patients admitted to the hospital, discharge time will be determined by your treatment team.  Patients discharged the day of surgery will not be allowed to drive  home and must have someone with them for 24 hours.    Special instructions:   DO NOT smoke tobacco or vape for 24 hours before your procedure.  Please read over the following fact sheets that you were given. Pain Booklet, Coughing and Deep Breathing, Blood Transfusion Information, Lab Information, Surgical Site Infection Prevention, Anesthesia Post-op Instructions, and Care and Recovery After Surgery      Colostomy Reversal Surgery, Care After The following information offers guidance on how to care for yourself after your procedure. Your health care provider may also give you more specific instructions. If you have problems or questions, contact your health care provider. What can I expect after the procedure? After the procedure, it is common to have: Pain and discomfort in your abdomen, especially near your incision. Decreased appetite. You may have temporary bowel changes, such  as: Passing gas (flatulence). An urgent need to have a bowel movement. More frequent bowel movements. Some leakage of stool or the inability to control when you have bowel movements (incontinence). This may cause skin soreness around your rectum due to exposure to stool and wiping of skin. This usually gets better within a couple weeks. Loose stools or diarrhea. Constipation. Follow these instructions at home: Activity  You may have to avoid lifting. Ask your health care provider how much you can safely lift. Return to your normal activities as told by your health care provider. Ask your health care provider what activities are safe for you. Avoid sitting for a long time without moving. Get up to take short walks every 1-2 hours. This is important to improve blood flow and breathing. Ask for help if you feel weak or unsteady. Avoid activities that take a lot of effort, contact sports, and abdominal exercises for 4 weeks or as long as told by your health care provider. Incision care  Follow instructions from your health care provider about how to take care of your incision. Make sure you: Wash your hands with soap and water for at least 20 seconds before and after you change your bandage (dressing). If soap and water are not available, use hand sanitizer. Change your dressing as told by your health care provider. Leave stitches (sutures), skin glue, or adhesive strips in place. These skin closures may need to stay in place for 2 weeks or longer. If adhesive strip edges start to loosen and curl up, you may trim the loose edges. Do not remove adhesive strips completely unless your health care provider tells you to do that. Keep the incision area clean and dry. Check your incision area every day for signs of infection. Check for: More redness, swelling, or pain. More fluid or blood. Warmth. Pus or a bad smell. To protect the incision, hold a folded blanket or small pillow against your abdomen  when coughing, sneezing, or bending. Bathing Do not take baths, swim, or use a hot tub until your health care provider approves. Ask your health care provider if you may take showers. You may only be allowed to take sponge baths. If your health care provider approves bathing and showering, cover the dressing with a watertight covering to protect it from water. Do not let the dressing get wet. Keep the dressing dry until your health care provider says it can be removed. Driving If you were given a sedative during the procedure, it can affect you for several hours. Do not drive or operate machinery until your health care provider says that it is safe. Ask your health  care provider if the medicine prescribed to you requires you to avoid driving or using machinery. Follow other driving restrictions as told by your health care provider. Eating and drinking Follow instructions from your health care provider about eating or drinking restrictions. This may include: What to eat and drink. You may be told to start eating a bland diet. Over time, you may slowly return to a more normal, healthy diet. How much to eat and drink. You should eat small meals often and stop eating when you feel full. Take nutrition supplements as told by your health care provider or dietitian. General instructions Take over-the-counter and prescription medicines only as told by your health care provider. Take steps to treat diarrhea or constipation as told by your health care provider. Your health care provider may recommend that you: Drink enough fluid to keep your urine pale yellow. Avoid fluids that contain a lot of sugar or caffeine, such as energy drinks, sports drinks, and soda. Eat bland, easy-to-digest foods in small amounts as you are able. These foods include bananas, applesauce, rice, lean meats, toast, and crackers. Take over-the-counter or prescription medicines. Limit foods that are high in fat and processed sugars,  such as fried or sweet foods. If you have skin soreness around your rectum, apply a skin barrier ointment or paste. This can help prevent irritation from occurring or getting worse. Do not use any products that contain nicotine or tobacco. These products include cigarettes, chewing tobacco, and vaping devices, such as e-cigarettes. These can delay incision healing after surgery. If you need help quitting, ask your health care provider. Keep all follow-up visits. This is important. Contact a health care provider if: You have any of these signs of infection: More redness, swelling, or pain at the site of your incision. More fluid or blood coming from your incision. Warmth coming from your incision. Pus or a bad smell coming from your incision. A fever. Your incision breaks open. You feel nauseous. You are constipated, or not able to have a bowel movement. Your diarrhea gets worse. You have pain that is not controlled with medicine. Get help right away if: You have abdominal pain that does not go away or becomes severe. You have frequent vomiting and you are not able to eat or drink. You have difficulty breathing. Summary After colostomy reversal surgery, it is common to have abdominal pain, decreased appetite, diarrhea, or constipation. Follow instructions from your health care provider about how to take care of your incision. Do not let the dressing get wet. Take over-the-counter and prescription medicines only as told by your health care provider. Contact your health care provider if you are constipated, or not able to have a bowel movement. Keep all follow-up visits. This is important. This information is not intended to replace advice given to you by your health care provider. Make sure you discuss any questions you have with your health care provider. Document Revised: 11/21/2020 Document Reviewed: 11/21/2020 Elsevier Patient Education  2024 [employer] General Anesthesia, Adult,  Care After The following information offers guidance on how to care for yourself after your procedure. Your health care provider may also give you more specific instructions. If you have problems or questions, contact your health care provider. What can I expect after the procedure? After the procedure, it is common for people to: Have pain or discomfort at the IV site. Have nausea or vomiting. Have a sore throat or hoarseness. Have trouble concentrating. Feel cold or chills. Feel weak, sleepy,  or tired (fatigue). Have soreness and body aches. These can affect parts of the body that were not involved in surgery. Follow these instructions at home: For the time period you were told by your health care provider:  Rest. Do not participate in activities where you could fall or become injured. Do not drive or use machinery. Do not drink alcohol. Do not take sleeping pills or medicines that cause drowsiness. Do not make important decisions or sign legal documents. Do not take care of children on your own. General instructions Drink enough fluid to keep your urine pale yellow. If you have sleep apnea, surgery and certain medicines can increase your risk for breathing problems. Follow instructions from your health care provider about wearing your sleep device: Anytime you are sleeping, including during daytime naps. While taking prescription pain medicines, sleeping medicines, or medicines that make you drowsy. Return to your normal activities as told by your health care provider. Ask your health care provider what activities are safe for you. Take over-the-counter and prescription medicines only as told by your health care provider. Do not use any products that contain nicotine or tobacco. These products include cigarettes, chewing tobacco, and vaping devices, such as e-cigarettes. These can delay incision healing after surgery. If you need help quitting, ask your health care provider. Contact a  health care provider if: You have nausea or vomiting that does not get better with medicine. You vomit every time you eat or drink. You have pain that does not get better with medicine. You cannot urinate or have bloody urine. You develop a skin rash. You have a fever. Get help right away if: You have trouble breathing. You have chest pain. You vomit blood. These symptoms may be an emergency. Get help right away. Call 911. Do not wait to see if the symptoms will go away. Do not drive yourself to the hospital. Summary After the procedure, it is common to have a sore throat, hoarseness, nausea, vomiting, or to feel weak, sleepy, or fatigue. For the time period you were told by your health care provider, do not drive or use machinery. Get help right away if you have difficulty breathing, have chest pain, or vomit blood. These symptoms may be an emergency. This information is not intended to replace advice given to you by your health care provider. Make sure you discuss any questions you have with your health care provider. Document Revised: 06/26/2021 Document Reviewed: 06/26/2021 Elsevier Patient Education  2024 [employer] How to Use Chlorhexidine Before Surgery Chlorhexidine gluconate (CHG) is a germ-killing (antiseptic) solution that is used to clean the skin. It can get rid of the bacteria that normally live on the skin and can keep them away for about 24 hours. To clean your skin with CHG, you may be given: A CHG solution to use in the shower or as part of a sponge bath. A prepackaged cloth that contains CHG. Cleaning your skin with CHG may help lower the risk for infection: While you are staying in the intensive care unit of the hospital. If you have a vascular access, such as a central line, to provide short-term or long-term access to your veins. If you have a catheter to drain urine from your bladder. If you are on a ventilator. A ventilator is a machine that helps you breathe  by moving air in and out of your lungs. After surgery. What are the risks? Risks of using CHG include: A skin reaction. Hearing loss, if CHG gets  in your ears and you have a perforated eardrum. Eye injury, if CHG gets in your eyes and is not rinsed out. The CHG product catching fire. Make sure that you avoid smoking and flames after applying CHG to your skin. Do not use CHG: If you have a chlorhexidine allergy or have previously reacted to chlorhexidine. On babies younger than 73 months of age. How to use CHG solution Use CHG only as told by your health care provider, and follow the instructions on the label. Use the full amount of CHG as directed. Usually, this is one bottle. During a shower Follow these steps when using CHG solution during a shower (unless your health care provider gives you different instructions): Start the shower. Use your normal soap and shampoo to wash your face and hair. Turn off the shower or move out of the shower stream. Pour the CHG onto a clean washcloth. Do not use any type of brush or rough-edged sponge. Starting at your neck, lather your body down to your toes. Make sure you follow these instructions: If you will be having surgery, pay special attention to the part of your body where you will be having surgery. Scrub this area for at least 1 minute. Do not use CHG on your head or face. If the solution gets into your ears or eyes, rinse them well with water. Avoid your genital area. Avoid any areas of skin that have broken skin, cuts, or scrapes. Scrub your back and under your arms. Make sure to wash skin folds. Let the lather sit on your skin for 1-2 minutes or as long as told by your health care provider. Thoroughly rinse your entire body in the shower. Make sure that all body creases and crevices are rinsed well. Dry off with a clean towel. Do not put any substances on your body afterward--such as powder, lotion, or perfume--unless you are told to do so  by your health care provider. Only use lotions that are recommended by the manufacturer. Put on clean clothes or pajamas. If it is the night before your surgery, sleep in clean sheets.  During a sponge bath Follow these steps when using CHG solution during a sponge bath (unless your health care provider gives you different instructions): Use your normal soap and shampoo to wash your face and hair. Pour the CHG onto a clean washcloth. Starting at your neck, lather your body down to your toes. Make sure you follow these instructions: If you will be having surgery, pay special attention to the part of your body where you will be having surgery. Scrub this area for at least 1 minute. Do not use CHG on your head or face. If the solution gets into your ears or eyes, rinse them well with water. Avoid your genital area. Avoid any areas of skin that have broken skin, cuts, or scrapes. Scrub your back and under your arms. Make sure to wash skin folds. Let the lather sit on your skin for 1-2 minutes or as long as told by your health care provider. Using a different clean, wet washcloth, thoroughly rinse your entire body. Make sure that all body creases and crevices are rinsed well. Dry off with a clean towel. Do not put any substances on your body afterward--such as powder, lotion, or perfume--unless you are told to do so by your health care provider. Only use lotions that are recommended by the manufacturer. Put on clean clothes or pajamas. If it is the night before your surgery, sleep  in clean sheets. How to use CHG prepackaged cloths Only use CHG cloths as told by your health care provider, and follow the instructions on the label. Use the CHG cloth on clean, dry skin. Do not use the CHG cloth on your head or face unless your health care provider tells you to. When washing with the CHG cloth: Avoid your genital area. Avoid any areas of skin that have broken skin, cuts, or scrapes. Before  surgery Follow these steps when using a CHG cloth to clean before surgery (unless your health care provider gives you different instructions): Using the CHG cloth, vigorously scrub the part of your body where you will be having surgery. Scrub using a back-and-forth motion for 3 minutes. The area on your body should be completely wet with CHG when you are done scrubbing. Do not rinse. Discard the cloth and let the area air-dry. Do not put any substances on the area afterward, such as powder, lotion, or perfume. Put on clean clothes or pajamas. If it is the night before your surgery, sleep in clean sheets.  For general bathing Follow these steps when using CHG cloths for general bathing (unless your health care provider gives you different instructions). Use a separate CHG cloth for each area of your body. Make sure you wash between any folds of skin and between your fingers and toes. Wash your body in the following order, switching to a new cloth after each step: The front of your neck, shoulders, and chest. Both of your arms, under your arms, and your hands. Your stomach and groin area, avoiding the genitals. Your right leg and foot. Your left leg and foot. The back of your neck, your back, and your buttocks. Do not rinse. Discard the cloth and let the area air-dry. Do not put any substances on your body afterward--such as powder, lotion, or perfume--unless you are told to do so by your health care provider. Only use lotions that are recommended by the manufacturer. Put on clean clothes or pajamas. Contact a health care provider if: Your skin gets irritated after scrubbing. You have questions about using your solution or cloth. You swallow any chlorhexidine. Call your local poison control center ([phone] in the [place] Get help right away if: Your eyes itch badly, or they become very red or swollen. Your skin itches badly and is red or swollen. Your hearing changes. You have trouble  seeing. You have swelling or tingling in your mouth or throat. You have trouble breathing. These symptoms may represent a serious problem that is an emergency. Do not wait to see if the symptoms will go away. Get medical help right away. Call your local emergency services (911 in the [place] Do not drive yourself to the hospital. Summary Chlorhexidine gluconate (CHG) is a germ-killing (antiseptic) solution that is used to clean the skin. Cleaning your skin with CHG may help to lower your risk for infection. You may be given CHG to use for bathing. It may be in a bottle or in a prepackaged cloth to use on your skin. Carefully follow your health care provider's instructions and the instructions on the product label. Do not use CHG if you have a chlorhexidine allergy. Contact your health care provider if your skin gets irritated after scrubbing. This information is not intended to replace advice given to you by your health care provider. Make sure you discuss any questions you have with your health care provider. Document Revised: 07/27/2021 Document Reviewed: 06/09/2020 Elsevier Patient Education  2023 [employer]

## 2022-09-08 ENCOUNTER — Encounter (HOSPITAL_COMMUNITY): Payer: Self-pay

## 2022-09-08 ENCOUNTER — Encounter (HOSPITAL_COMMUNITY)
Admission: RE | Admit: 2022-09-08 | Discharge: 2022-09-08 | Disposition: A | Discharge status: Home / Self Care | Payer: Medicare Other | Source: Ambulatory Visit | Attending: General Surgery | Admitting: General Surgery

## 2022-09-08 DIAGNOSIS — Z01818 Encounter for other preprocedural examination: Secondary | ICD-10-CM

## 2022-09-08 DIAGNOSIS — Z433 Encounter for attention to colostomy: Secondary | ICD-10-CM | POA: Diagnosis not present

## 2022-09-08 DIAGNOSIS — Z01812 Encounter for preprocedural laboratory examination: Secondary | ICD-10-CM | POA: Insufficient documentation

## 2022-09-08 DIAGNOSIS — E162 Hypoglycemia, unspecified: Secondary | ICD-10-CM | POA: Insufficient documentation

## 2022-09-08 DIAGNOSIS — E876 Hypokalemia: Secondary | ICD-10-CM

## 2022-09-08 DIAGNOSIS — Z939 Artificial opening status, unspecified: Secondary | ICD-10-CM | POA: Insufficient documentation

## 2022-09-08 DIAGNOSIS — Z933 Colostomy status: Secondary | ICD-10-CM | POA: Diagnosis not present

## 2022-09-08 HISTORY — DX: Depression, unspecified: F32.A

## 2022-09-08 LAB — COMPREHENSIVE METABOLIC PANEL
ALT: 25 U/L (ref 0–44)
AST: 29 U/L (ref 15–41)
Albumin: 4.2 g/dL (ref 3.5–5.0)
Alkaline Phosphatase: 54 U/L (ref 38–126)
Anion gap: 13 (ref 5–15)
BUN: 23 mg/dL (ref 8–23)
CO2: 22 mmol/L (ref 22–32)
Calcium: 9.4 mg/dL (ref 8.9–10.3)
Chloride: 101 mmol/L (ref 98–111)
Creatinine, Ser: 0.66 mg/dL (ref 0.44–1.00)
GFR, Estimated: >60 mL/min (ref >60)
Glucose, Bld: 102 mg/dL — ABNORMAL HIGH (ref 70–99)
Potassium: 4 mmol/L (ref 3.5–5.1)
Sodium: 136 mmol/L (ref 135–145)
Total Bilirubin: 0.5 mg/dL (ref 0.3–1.2)
Total Protein: 7.6 g/dL (ref 6.5–8.1)

## 2022-09-08 LAB — CBC WITH DIFFERENTIAL/PLATELET
Abs Immature Granulocytes: 0.01 10*3/uL (ref 0.00–0.07)
Basophils Absolute: 0 10*3/uL (ref 0.0–0.1)
Basophils Relative: 1 %
Eosinophils Absolute: 0.2 10*3/uL (ref 0.0–0.5)
Eosinophils Relative: 2 %
HCT: 39 % (ref 36.0–46.0)
Hemoglobin: 12.1 g/dL (ref 12.0–15.0)
Immature Granulocytes: 0 %
Lymphocytes Relative: 25 %
Lymphs Abs: 1.5 10*3/uL (ref 0.7–4.0)
MCH: 28 pg (ref 26.0–34.0)
MCHC: 31 g/dL (ref 30.0–36.0)
MCV: 90.3 fL (ref 80.0–100.0)
Monocytes Absolute: 0.5 10*3/uL (ref 0.1–1.0)
Monocytes Relative: 9 %
Neutro Abs: 3.9 10*3/uL (ref 1.7–7.7)
Neutrophils Relative %: 63 %
Platelets: 270 10*3/uL (ref 150–400)
RBC: 4.32 MIL/uL (ref 3.87–5.11)
RDW: 13.6 % (ref 11.5–15.5)
WBC: 6.1 10*3/uL (ref 4.0–10.5)
nRBC: 0 % (ref 0.0–0.2)

## 2022-09-08 LAB — TYPE AND SCREEN

## 2022-09-08 LAB — PREPARE RBC (CROSSMATCH)

## 2022-09-09 LAB — HEMOGLOBIN A1C
Hgb A1c MFr Bld: 5.7 % — ABNORMAL HIGH (ref 4.8–5.6)
Mean Plasma Glucose: 117 mg/dL

## 2022-09-10 ENCOUNTER — Inpatient Hospital Stay (HOSPITAL_COMMUNITY)
Admission: RE | Admit: 2022-09-10 | Discharge: 2022-09-15 | DRG: 346 | Disposition: A | Discharge status: Home Health | Payer: Medicare Other | Attending: General Surgery | Admitting: General Surgery

## 2022-09-10 ENCOUNTER — Inpatient Hospital Stay (HOSPITAL_COMMUNITY): Payer: Medicare Other | Admitting: Certified Registered"

## 2022-09-10 ENCOUNTER — Other Ambulatory Visit: Payer: Self-pay

## 2022-09-10 ENCOUNTER — Encounter (HOSPITAL_COMMUNITY): Payer: Self-pay | Admitting: General Surgery

## 2022-09-10 ENCOUNTER — Encounter (HOSPITAL_COMMUNITY)
Admission: RE | Disposition: A | Discharge status: Home / Self Care | Payer: Self-pay | Source: Home / Self Care | Attending: General Surgery

## 2022-09-10 ENCOUNTER — Emergency Department (HOSPITAL_COMMUNITY)
Admission: EM | Admit: 2022-09-10 | Discharge: 2022-09-10 | Disposition: A | Discharge status: Home / Self Care | Payer: Medicare Other | Source: Home / Self Care | Attending: Emergency Medicine | Admitting: Emergency Medicine

## 2022-09-10 DIAGNOSIS — E039 Hypothyroidism, unspecified: Secondary | ICD-10-CM | POA: Diagnosis present

## 2022-09-10 DIAGNOSIS — Z90710 Acquired absence of both cervix and uterus: Secondary | ICD-10-CM | POA: Diagnosis not present

## 2022-09-10 DIAGNOSIS — K660 Peritoneal adhesions (postprocedural) (postinfection): Secondary | ICD-10-CM | POA: Diagnosis present

## 2022-09-10 DIAGNOSIS — Z433 Encounter for attention to colostomy: Secondary | ICD-10-CM

## 2022-09-10 DIAGNOSIS — Z806 Family history of leukemia: Secondary | ICD-10-CM | POA: Diagnosis not present

## 2022-09-10 DIAGNOSIS — Z933 Colostomy status: Secondary | ICD-10-CM

## 2022-09-10 DIAGNOSIS — R110 Nausea: Secondary | ICD-10-CM

## 2022-09-10 DIAGNOSIS — M810 Age-related osteoporosis without current pathological fracture: Secondary | ICD-10-CM | POA: Diagnosis present

## 2022-09-10 DIAGNOSIS — Z79890 Hormone replacement therapy (postmenopausal): Secondary | ICD-10-CM

## 2022-09-10 DIAGNOSIS — K219 Gastro-esophageal reflux disease without esophagitis: Secondary | ICD-10-CM | POA: Diagnosis present

## 2022-09-10 DIAGNOSIS — F419 Anxiety disorder, unspecified: Secondary | ICD-10-CM | POA: Diagnosis present

## 2022-09-10 DIAGNOSIS — Z7982 Long term (current) use of aspirin: Secondary | ICD-10-CM | POA: Insufficient documentation

## 2022-09-10 DIAGNOSIS — F418 Other specified anxiety disorders: Secondary | ICD-10-CM | POA: Diagnosis not present

## 2022-09-10 DIAGNOSIS — Z939 Artificial opening status, unspecified: Secondary | ICD-10-CM

## 2022-09-10 HISTORY — PX: COLOSTOMY REVERSAL: SHX5782

## 2022-09-10 LAB — COMPREHENSIVE METABOLIC PANEL
ALT: 29 U/L (ref 0–44)
AST: 35 U/L (ref 15–41)
Albumin: 4.7 g/dL (ref 3.5–5.0)
Alkaline Phosphatase: 60 U/L (ref 38–126)
Anion gap: 13 (ref 5–15)
BUN: 16 mg/dL (ref 8–23)
CO2: 22 mmol/L (ref 22–32)
Calcium: 9.5 mg/dL (ref 8.9–10.3)
Chloride: 94 mmol/L — ABNORMAL LOW (ref 98–111)
Creatinine, Ser: 0.7 mg/dL (ref 0.44–1.00)
GFR, Estimated: >60 mL/min (ref >60)
Glucose, Bld: 104 mg/dL — ABNORMAL HIGH (ref 70–99)
Potassium: 3.5 mmol/L (ref 3.5–5.1)
Sodium: 129 mmol/L — ABNORMAL LOW (ref 135–145)
Total Bilirubin: 0.8 mg/dL (ref 0.3–1.2)
Total Protein: 8.3 g/dL — ABNORMAL HIGH (ref 6.5–8.1)

## 2022-09-10 LAB — CBC WITH DIFFERENTIAL/PLATELET
Abs Immature Granulocytes: 0.02 10*3/uL (ref 0.00–0.07)
Basophils Absolute: 0 10*3/uL (ref 0.0–0.1)
Basophils Relative: 0 %
Eosinophils Absolute: 0 10*3/uL (ref 0.0–0.5)
Eosinophils Relative: 0 %
HCT: 38.4 % (ref 36.0–46.0)
Hemoglobin: 12.6 g/dL (ref 12.0–15.0)
Immature Granulocytes: 0 %
Lymphocytes Relative: 12 %
Lymphs Abs: 0.9 10*3/uL (ref 0.7–4.0)
MCH: 28.6 pg (ref 26.0–34.0)
MCHC: 32.8 g/dL (ref 30.0–36.0)
MCV: 87.1 fL (ref 80.0–100.0)
Monocytes Absolute: 0.5 10*3/uL (ref 0.1–1.0)
Monocytes Relative: 7 %
Neutro Abs: 5.7 10*3/uL (ref 1.7–7.7)
Neutrophils Relative %: 81 %
Platelets: 265 10*3/uL (ref 150–400)
RBC: 4.41 MIL/uL (ref 3.87–5.11)
RDW: 13.3 % (ref 11.5–15.5)
WBC: 7.1 10*3/uL (ref 4.0–10.5)
nRBC: 0 % (ref 0.0–0.2)

## 2022-09-10 LAB — TYPE AND SCREEN
ABO/RH(D): O POS
ABO/RH(D): O POS
Unit division: 0

## 2022-09-10 LAB — BPAM RBC: Unit Type and Rh: 5100

## 2022-09-10 LAB — ABO/RH: ABO/RH(D): O POS

## 2022-09-10 LAB — GLUCOSE, CAPILLARY: Glucose-Capillary: 177 mg/dL — ABNORMAL HIGH (ref 70–99)

## 2022-09-10 SURGERY — COLOSTOMY REVERSAL
Anesthesia: General | Site: Abdomen

## 2022-09-10 MED ORDER — ACETAMINOPHEN 500 MG PO TABS
1000.0000 mg | ORAL_TABLET | ORAL | Status: AC
  Administered 2022-09-10: 1000 mg via ORAL
  Filled 2022-09-10: qty 2

## 2022-09-10 MED ORDER — AYR SALINE NASAL GEL NA SWAB: Freq: Two times a day (BID) | NASAL | Status: DC

## 2022-09-10 MED ORDER — LACTATED RINGERS IV SOLN: INTRAVENOUS | Status: DC

## 2022-09-10 MED ORDER — MORPHINE SULFATE (PF) 2 MG/ML IV SOLN: 2.0000 mg | INTRAVENOUS | Status: DC | PRN

## 2022-09-10 MED ORDER — METOPROLOL TARTRATE 5 MG/5ML IV SOLN: 5.0000 mg | Freq: Four times a day (QID) | INTRAVENOUS | Status: DC | PRN

## 2022-09-10 MED ORDER — SODIUM CHLORIDE 0.9 % IV BOLUS
500.0000 mL | Freq: Once | INTRAVENOUS | Status: AC
  Administered 2022-09-10: 500 mL via INTRAVENOUS

## 2022-09-10 MED ORDER — EPHEDRINE 5 MG/ML INJ
INTRAVENOUS | Status: AC
  Filled 2022-09-10: qty 5

## 2022-09-10 MED ORDER — PHENYLEPHRINE 80 MCG/ML (10ML) SYRINGE FOR IV PUSH (FOR BLOOD PRESSURE SUPPORT)
PREFILLED_SYRINGE | INTRAVENOUS | Status: AC
  Filled 2022-09-10: qty 10

## 2022-09-10 MED ORDER — PROSOURCE PLUS PO LIQD
30.0000 mL | Freq: Three times a day (TID) | ORAL | Status: DC
  Administered 2022-09-11 – 2022-09-13 (×6): 30 mL via ORAL
  Filled 2022-09-10 (×8): qty 30

## 2022-09-10 MED ORDER — DIPHENHYDRAMINE HCL 50 MG/ML IJ SOLN: 12.5000 mg | Freq: Four times a day (QID) | INTRAMUSCULAR | Status: DC | PRN

## 2022-09-10 MED ORDER — ALUM & MAG HYDROXIDE-SIMETH 200-200-20 MG/5ML PO SUSP: 30.0000 mL | Freq: Four times a day (QID) | ORAL | Status: DC | PRN

## 2022-09-10 MED ORDER — ENSURE PRE-SURGERY PO LIQD
592.0000 mL | Freq: Once | ORAL | Status: DC
  Filled 2022-09-10: qty 592

## 2022-09-10 MED ORDER — DEXAMETHASONE SODIUM PHOSPHATE 10 MG/ML IJ SOLN
INTRAMUSCULAR | Status: AC
  Filled 2022-09-10: qty 1

## 2022-09-10 MED ORDER — ONDANSETRON HCL 4 MG/2ML IJ SOLN
INTRAMUSCULAR | Status: AC
  Filled 2022-09-10: qty 2

## 2022-09-10 MED ORDER — DEXAMETHASONE SODIUM PHOSPHATE 10 MG/ML IJ SOLN
INTRAMUSCULAR | Status: DC | PRN
  Administered 2022-09-10: 10 mg via INTRAVENOUS

## 2022-09-10 MED ORDER — ENSURE PRE-SURGERY PO LIQD
296.0000 mL | Freq: Once | ORAL | Status: DC
  Filled 2022-09-10: qty 296

## 2022-09-10 MED ORDER — MELATONIN 3 MG PO TABS: 3.0000 mg | ORAL_TABLET | Freq: Every evening | ORAL | Status: DC | PRN

## 2022-09-10 MED ORDER — ONDANSETRON HCL 4 MG PO TABS: 4.0000 mg | ORAL_TABLET | Freq: Four times a day (QID) | ORAL | Status: DC | PRN

## 2022-09-10 MED ORDER — ACETAMINOPHEN 500 MG PO TABS
1000.0000 mg | ORAL_TABLET | Freq: Four times a day (QID) | ORAL | Status: DC
  Administered 2022-09-10 – 2022-09-15 (×15): 1000 mg via ORAL
  Filled 2022-09-10 (×18): qty 2

## 2022-09-10 MED ORDER — FENTANYL CITRATE (PF) 250 MCG/5ML IJ SOLN
INTRAMUSCULAR | Status: AC
  Filled 2022-09-10: qty 5

## 2022-09-10 MED ORDER — PROPOFOL 10 MG/ML IV BOLUS
INTRAVENOUS | Status: AC
  Filled 2022-09-10: qty 20

## 2022-09-10 MED ORDER — OXYCODONE HCL 5 MG PO TABS: 5.0000 mg | ORAL_TABLET | ORAL | Status: DC | PRN

## 2022-09-10 MED ORDER — ONDANSETRON HCL 4 MG/2ML IJ SOLN
INTRAMUSCULAR | Status: DC | PRN
  Administered 2022-09-10: 4 mg via INTRAVENOUS

## 2022-09-10 MED ORDER — ONDANSETRON HCL 4 MG/2ML IJ SOLN: 4.0000 mg | Freq: Four times a day (QID) | INTRAMUSCULAR | Status: DC | PRN

## 2022-09-10 MED ORDER — ALVIMOPAN 12 MG PO CAPS
12.0000 mg | ORAL_CAPSULE | Freq: Two times a day (BID) | ORAL | Status: DC
  Administered 2022-09-11 – 2022-09-12 (×3): 12 mg via ORAL
  Filled 2022-09-10 (×3): qty 1

## 2022-09-10 MED ORDER — DIPHENHYDRAMINE HCL 12.5 MG/5ML PO ELIX: 12.5000 mg | ORAL_SOLUTION | Freq: Four times a day (QID) | ORAL | Status: DC | PRN

## 2022-09-10 MED ORDER — CHLORHEXIDINE GLUCONATE CLOTH 2 % EX PADS: 6.0000 | MEDICATED_PAD | Freq: Once | CUTANEOUS | Status: DC

## 2022-09-10 MED ORDER — HEPARIN SODIUM (PORCINE) 5000 UNIT/ML IJ SOLN
5000.0000 [IU] | Freq: Three times a day (TID) | INTRAMUSCULAR | Status: DC
  Administered 2022-09-11 – 2022-09-12 (×3): 5000 [IU] via SUBCUTANEOUS
  Filled 2022-09-10 (×3): qty 1

## 2022-09-10 MED ORDER — ONDANSETRON HCL 4 MG/2ML IJ SOLN: 4.0000 mg | Freq: Once | INTRAMUSCULAR | Status: DC | PRN

## 2022-09-10 MED ORDER — LEVOTHYROXINE SODIUM 50 MCG PO TABS
50.0000 ug | ORAL_TABLET | Freq: Every day | ORAL | Status: DC
  Administered 2022-09-11 – 2022-09-15 (×5): 50 ug via ORAL
  Filled 2022-09-10 (×5): qty 1

## 2022-09-10 MED ORDER — ALVIMOPAN 12 MG PO CAPS
12.0000 mg | ORAL_CAPSULE | ORAL | Status: AC
  Administered 2022-09-10: 12 mg via ORAL
  Filled 2022-09-10: qty 1

## 2022-09-10 MED ORDER — SODIUM CHLORIDE 0.9 % IV SOLN
2.0000 g | Freq: Two times a day (BID) | INTRAVENOUS | Status: AC
  Administered 2022-09-10 – 2022-09-11 (×2): 2 g via INTRAVENOUS
  Filled 2022-09-10 (×2): qty 2

## 2022-09-10 MED ORDER — SACCHAROMYCES BOULARDII 250 MG PO CAPS
250.0000 mg | ORAL_CAPSULE | Freq: Two times a day (BID) | ORAL | Status: DC
  Administered 2022-09-10 – 2022-09-15 (×11): 250 mg via ORAL
  Filled 2022-09-10 (×11): qty 1

## 2022-09-10 MED ORDER — ORAL CARE MOUTH RINSE: 15.0000 mL | Freq: Once | OROMUCOSAL | Status: DC

## 2022-09-10 MED ORDER — SIMETHICONE 80 MG PO CHEW: 40.0000 mg | CHEWABLE_TABLET | Freq: Four times a day (QID) | ORAL | Status: DC | PRN

## 2022-09-10 MED ORDER — EPHEDRINE SULFATE (PRESSORS) 50 MG/ML IJ SOLN
INTRAMUSCULAR | Status: DC | PRN
  Administered 2022-09-10: 5 mg via INTRAVENOUS
  Administered 2022-09-10: 10 mg via INTRAVENOUS

## 2022-09-10 MED ORDER — BUPIVACAINE HCL (PF) 0.5 % IJ SOLN
INTRAMUSCULAR | Status: DC | PRN
  Administered 2022-09-10: 30 mL

## 2022-09-10 MED ORDER — ROCURONIUM BROMIDE 10 MG/ML (PF) SYRINGE
PREFILLED_SYRINGE | INTRAVENOUS | Status: AC
  Filled 2022-09-10: qty 10

## 2022-09-10 MED ORDER — ALPRAZOLAM 0.25 MG PO TABS
0.2500 mg | ORAL_TABLET | Freq: Two times a day (BID) | ORAL | Status: DC | PRN
  Administered 2022-09-12: 0.25 mg via ORAL
  Filled 2022-09-10: qty 1

## 2022-09-10 MED ORDER — ENSURE SURGERY PO LIQD
237.0000 mL | Freq: Two times a day (BID) | ORAL | Status: DC
  Administered 2022-09-10 – 2022-09-13 (×7): 237 mL via ORAL
  Filled 2022-09-10 (×11): qty 237

## 2022-09-10 MED ORDER — SODIUM CHLORIDE 0.9 % IR SOLN
Status: DC | PRN
  Administered 2022-09-10 (×3): 1000 mL

## 2022-09-10 MED ORDER — SODIUM CHLORIDE 0.9 % IV SOLN
2.0000 g | INTRAVENOUS | Status: AC
  Administered 2022-09-10: 2 g via INTRAVENOUS
  Filled 2022-09-10: qty 2

## 2022-09-10 MED ORDER — LIDOCAINE HCL (PF) 2 % IJ SOLN
INTRAMUSCULAR | Status: AC
  Filled 2022-09-10: qty 5

## 2022-09-10 MED ORDER — CHLORHEXIDINE GLUCONATE 0.12 % MT SOLN: 15.0000 mL | Freq: Once | OROMUCOSAL | Status: DC

## 2022-09-10 MED ORDER — OXYCODONE HCL 5 MG PO TABS: 5.0000 mg | ORAL_TABLET | Freq: Once | ORAL | Status: DC | PRN

## 2022-09-10 MED ORDER — CELECOXIB 100 MG PO CAPS
200.0000 mg | ORAL_CAPSULE | Freq: Two times a day (BID) | ORAL | Status: DC
  Administered 2022-09-10 – 2022-09-12 (×5): 200 mg via ORAL
  Filled 2022-09-10 (×6): qty 2

## 2022-09-10 MED ORDER — ONDANSETRON HCL 4 MG/2ML IJ SOLN
4.0000 mg | Freq: Once | INTRAMUSCULAR | Status: AC
  Administered 2022-09-10: 4 mg via INTRAVENOUS
  Filled 2022-09-10: qty 2

## 2022-09-10 MED ORDER — PROPOFOL 10 MG/ML IV BOLUS
INTRAVENOUS | Status: DC | PRN
  Administered 2022-09-10: 20 mg via INTRAVENOUS
  Administered 2022-09-10: 80 mg via INTRAVENOUS

## 2022-09-10 MED ORDER — BOOST / RESOURCE BREEZE PO LIQD CUSTOM
1.0000 | Freq: Three times a day (TID) | ORAL | Status: DC
  Administered 2022-09-10 – 2022-09-13 (×8): 1 via ORAL

## 2022-09-10 MED ORDER — HEPARIN SODIUM (PORCINE) 5000 UNIT/ML IJ SOLN
5000.0000 [IU] | Freq: Once | INTRAMUSCULAR | Status: AC
  Administered 2022-09-10: 5000 [IU] via SUBCUTANEOUS
  Filled 2022-09-10: qty 1

## 2022-09-10 MED ORDER — LIDOCAINE HCL (CARDIAC) PF 100 MG/5ML IV SOSY
PREFILLED_SYRINGE | INTRAVENOUS | Status: DC | PRN
  Administered 2022-09-10: 60 mg via INTRAVENOUS

## 2022-09-10 MED ORDER — SALINE SPRAY 0.65 % NA SOLN: 1.0000 | NASAL | Status: DC | PRN

## 2022-09-10 MED ORDER — CARBOXYMETHYLCELLULOSE SOD PF 0.25 % OP SOLN: 1.0000 [drp] | Freq: Four times a day (QID) | OPHTHALMIC | Status: DC | PRN

## 2022-09-10 MED ORDER — BUPIVACAINE HCL (PF) 0.5 % IJ SOLN
INTRAMUSCULAR | Status: AC
  Filled 2022-09-10: qty 30

## 2022-09-10 MED ORDER — OXYCODONE HCL 5 MG/5ML PO SOLN: 5.0000 mg | Freq: Once | ORAL | Status: DC | PRN

## 2022-09-10 MED ORDER — SUGAMMADEX SODIUM 200 MG/2ML IV SOLN
INTRAVENOUS | Status: DC | PRN
  Administered 2022-09-10: 150 mg via INTRAVENOUS

## 2022-09-10 MED ORDER — ADULT MULTIVITAMIN W/MINERALS CH
1.0000 | ORAL_TABLET | Freq: Every day | ORAL | Status: DC
  Administered 2022-09-10 – 2022-09-15 (×6): 1 via ORAL
  Filled 2022-09-10 (×6): qty 1

## 2022-09-10 MED ORDER — CHLORHEXIDINE GLUCONATE 0.12 % MT SOLN
OROMUCOSAL | Status: AC
  Filled 2022-09-10: qty 15

## 2022-09-10 MED ORDER — POLYVINYL ALCOHOL 1.4 % OP SOLN: 1.0000 [drp] | OPHTHALMIC | Status: DC | PRN

## 2022-09-10 MED ORDER — ROCURONIUM BROMIDE 100 MG/10ML IV SOLN
INTRAVENOUS | Status: DC | PRN
  Administered 2022-09-10 (×2): 20 mg via INTRAVENOUS
  Administered 2022-09-10: 50 mg via INTRAVENOUS

## 2022-09-10 MED ORDER — DEXMEDETOMIDINE HCL IN NACL 80 MCG/20ML IV SOLN
INTRAVENOUS | Status: AC
  Filled 2022-09-10: qty 20

## 2022-09-10 MED ORDER — FENTANYL CITRATE PF 50 MCG/ML IJ SOSY: 25.0000 ug | PREFILLED_SYRINGE | INTRAMUSCULAR | Status: DC | PRN

## 2022-09-10 MED ORDER — FENTANYL CITRATE (PF) 100 MCG/2ML IJ SOLN
INTRAMUSCULAR | Status: DC | PRN
  Administered 2022-09-10: 25 ug via INTRAVENOUS
  Administered 2022-09-10 (×3): 50 ug via INTRAVENOUS

## 2022-09-10 MED ORDER — DEXMEDETOMIDINE HCL IN NACL 80 MCG/20ML IV SOLN
INTRAVENOUS | Status: DC | PRN
  Administered 2022-09-10: 4 ug via INTRAVENOUS
  Administered 2022-09-10: 8 ug via INTRAVENOUS

## 2022-09-10 MED ORDER — PHENYLEPHRINE HCL (PRESSORS) 10 MG/ML IV SOLN
INTRAVENOUS | Status: DC | PRN
  Administered 2022-09-10 (×3): 80 ug via INTRAVENOUS

## 2022-09-10 MED ORDER — GABAPENTIN 300 MG PO CAPS
300.0000 mg | ORAL_CAPSULE | Freq: Two times a day (BID) | ORAL | Status: DC
  Administered 2022-09-10 – 2022-09-13 (×8): 300 mg via ORAL
  Filled 2022-09-10 (×8): qty 1

## 2022-09-10 SURGICAL SUPPLY — 57 items
CLOTH BEACON ORANGE TIMEOUT ST (SAFETY) ×1 IMPLANT
COVER LIGHT HANDLE STERIS (MISCELLANEOUS) ×4 IMPLANT
DRSG OPSITE POSTOP 4X8 (GAUZE/BANDAGES/DRESSINGS) IMPLANT
DRSG TEGADERM 4X4.75 (GAUZE/BANDAGES/DRESSINGS) ×1 IMPLANT
ELECT BLADE 6 FLAT ULTRCLN (ELECTRODE) ×1 IMPLANT
ELECT REM PT RETURN 9FT ADLT (ELECTROSURGICAL) ×1
ELECTRODE REM PT RTRN 9FT ADLT (ELECTROSURGICAL) ×1 IMPLANT
GAUZE SPONGE 4X4 12PLY STRL (GAUZE/BANDAGES/DRESSINGS) ×1 IMPLANT
GLOVE BIO SURGEON STRL SZ 6.5 (GLOVE) ×2 IMPLANT
GLOVE BIOGEL PI IND STRL 6.5 (GLOVE) ×2 IMPLANT
GLOVE BIOGEL PI IND STRL 7.0 (GLOVE) ×5 IMPLANT
GLOVE SURG SS PI 8.0 STRL IVOR (GLOVE) IMPLANT
GOWN STRL REUS W/TWL LRG LVL3 (GOWN DISPOSABLE) ×6 IMPLANT
INST SET MAJOR GENERAL (KITS) ×1 IMPLANT
KIT TURNOVER KIT A (KITS) ×1 IMPLANT
LIGASURE IMPACT 36 18CM CVD LR (INSTRUMENTS) ×1 IMPLANT
MANIFOLD NEPTUNE II (INSTRUMENTS) ×1 IMPLANT
NDL HYPO 18GX1.5 BLUNT FILL (NEEDLE) ×1 IMPLANT
NDL HYPO 21X1.5 SAFETY (NEEDLE) IMPLANT
NEEDLE HYPO 18GX1.5 BLUNT FILL (NEEDLE) ×1 IMPLANT
NEEDLE HYPO 21X1.5 SAFETY (NEEDLE) ×1 IMPLANT
NS IRRIG 1000ML POUR BTL (IV SOLUTION) ×3 IMPLANT
PACK COLON (CUSTOM PROCEDURE TRAY) ×1 IMPLANT
PAD ARMBOARD 7.5X6 YLW CONV (MISCELLANEOUS) ×1 IMPLANT
PENCIL SMOKE EVACUATOR COATED (MISCELLANEOUS) ×1 IMPLANT
RELOAD LINEAR CUT PROX 55 BLUE (ENDOMECHANICALS) IMPLANT
RELOAD PROXIMATE 75MM BLUE (ENDOMECHANICALS) IMPLANT
RELOAD STAPLE 55 3.8 BLU REG (ENDOMECHANICALS) IMPLANT
RELOAD STAPLE 75 3.8 BLU REG (ENDOMECHANICALS) IMPLANT
RETRACTOR WND ALEXIS-O 25 LRG (MISCELLANEOUS) ×1 IMPLANT
RETRACTOR WOUND ALXS 18CM MED (MISCELLANEOUS) IMPLANT
RTRCTR WOUND ALEXIS O 18CM MED (MISCELLANEOUS) ×1
RTRCTR WOUND ALEXIS O 25CM LRG (MISCELLANEOUS)
SHEET LAVH (DRAPES) IMPLANT
SPONGE INTESTINAL PEANUT (DISPOSABLE) IMPLANT
SPONGE T-LAP 18X18 ~~LOC~~+RFID (SPONGE) ×2 IMPLANT
STAPLER AUT SUT 4.8 EEAXL 31 (STAPLE) IMPLANT
STAPLER CIRC CVD 29MM 37CM (STAPLE) IMPLANT
STAPLER GUN LINEAR PROX 60 (STAPLE) IMPLANT
STAPLER PROXIMATE 55 BLUE (STAPLE) IMPLANT
STAPLER PROXIMATE 75MM BLUE (STAPLE) IMPLANT
STAPLER RELOADABLE 65 2-0 SUT (MISCELLANEOUS) IMPLANT
STAPLER SYS INTERNAL RELOAD SS (MISCELLANEOUS) ×1 IMPLANT
STAPLER TA28 THK CONTR EEA XL (STAPLE) IMPLANT
STAPLER VISISTAT (STAPLE) ×1 IMPLANT
SUT PDS AB CT VIOLET #0 27IN (SUTURE) ×2 IMPLANT
SUT PROLENE 2 0 SH 30 (SUTURE) ×1 IMPLANT
SUT SILK 0 FSL (SUTURE) ×1 IMPLANT
SUT SILK 3 0 SH CR/8 (SUTURE) ×1 IMPLANT
SUT VIC AB 0 CT2 8-18 (SUTURE) ×1 IMPLANT
SUT VIC AB 3-0 SH 27 (SUTURE) ×1
SUT VIC AB 3-0 SH 27X BRD (SUTURE) IMPLANT
SYR BULB IRRIG 60ML STRL (SYRINGE) IMPLANT
TOWEL ~~LOC~~+RFID 17X26 BLUE (SPONGE) IMPLANT
TRAY FOLEY W/BAG SLVR 16FR (SET/KITS/TRAYS/PACK) ×1
TRAY FOLEY W/BAG SLVR 16FR ST (SET/KITS/TRAYS/PACK) ×1 IMPLANT
YANKAUER SUCT BULB TIP 10FT TU (MISCELLANEOUS) ×1 IMPLANT

## 2022-09-10 NOTE — Anesthesia Procedure Notes (Signed)
Procedure Name: Intubation Date/Time: 09/10/2022 7:39 AM  Performed by: Franco Nones, CRNAPre-anesthesia Checklist: Patient identified, Patient being monitored, Timeout performed, Emergency Drugs available and Suction available Patient Re-evaluated:Patient Re-evaluated prior to induction Oxygen Delivery Method: Circle system utilized Preoxygenation: Pre-oxygenation with 100% oxygen Induction Type: IV induction and Cricoid Pressure applied Laryngoscope Size: Mac and 3 Grade View: Grade I Tube type: Oral Tube size: 7.0 mm Number of attempts: 1 Airway Equipment and Method: Stylet Placement Confirmation: ETT inserted through vocal cords under direct vision, positive ETCO2 and breath sounds checked- equal and bilateral Secured at: 22 cm Tube secured with: Tape Dental Injury: Teeth and Oropharynx as per pre-operative assessment

## 2022-09-10 NOTE — Op Note (Signed)
[facility] Operative Note  09/10/22  Preoperative Diagnosis:  Colostomy in place    Postoperative Diagnosis: Same   Procedure(s) Performed:  Reversal of colostomy, splenic flexure takedown   Surgeon: Leatrice Jewels. 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  We discussed reversal after 3 months and discussed the risk of bleeding, infection, anastomotic leak, ureter injury or injury to other organ.   Findings: Moderated adhesions with small bowel into the pelvis   Procedure: The patient was taken to the operating room and placed supine. General endotracheal anesthesia was induced. Intravenous antibiotics were administered per protocol.  An orogastric tube positioned to decompress the stomach. She was then placed in lithotomy and a foley catheter was placed. The colostomy site was closed with a running 0 Silk suture. The abdomen and perineal/ anal region were prepped and draped in the usual sterile fashion.   Her lower midline incisions was incised and carried down to the fascia. With care we entered the peritoneum. There were thin bands of adhesions between the small intestine and loops of small intestine adherent to the pelvis. With sharp and blunt dissection the adhesions were taken down and the small bowel was freed up. This was packed into the right upper quadrant with laparotomy pads. The rectal stump was noted. The right and left ureter were identified and protected.  Her colostomy was freed up intraperitoneal, and the skin was excised elliptically around  the closed colostomy site and freed up from the subcutaneous tissue with cautery. The end of the colostomy was taken off with a 55 mm linear cutting stapler to prevent any spillage or opening of the colostomy site.  The end of the colon was brought intraperitoneal. The descending colon and splenic flexure were freed up along the peritoneal reflection with cautery taken down the splenic flexure and freeing up the descending and transverse colon to fall into the pelvis.  My partner went below and dilated up the anal canal and placed 28 sizer into the stump.  A 28 EEA stapler was chosen, and the anvil was given to me.  A automatic pursestringer was used to create the pursestring and the staple line was resected off. The anvil was placed in and the bowel was tightened down around the anvil. A second pursestring of 2-0 prolene was placed on the outside of the original and tightened down. The EEA spike was deployed anterior to the staple line of the prior stump.  The anvil and spike were joined ensuring no twisting or any other tissue in the staple line, and the EEA was closed and fired. The EEA was then carefully removed and two whole donuts were noted. Saline was placed in the pelvis and a bulb suction device was used to inject air into the rectum and no bubbles were noted.    Using 2-0 silk some of the peritoneal reflection anteriorly was secured over the staple line in the deep pelvis to protect the anastomosis. The bowel was unpacked. There was no twisting. The small bowel was ran and a small serosal tear was lemberted  over with 3-0 Silk suture. The abdomen was irrigated.   The entire team then changed gown and gloves and new drapes and closing equipment was used. The colostomy site was closed with 0 Vicryl interrupted sutures closing the posterior fascia and then the anterior fascia in separate layers. The midline was closed with 0 PDS in the standard fashion. Irrigation was performed to the wounds. The space  of the colostomy site was re-approximated with 3-0 Vicryl interrupted sutures. The skin edges were released and the midline scar from secondary intention was excised. The skin was closed with staples on the midline and loosely on the colostomy site. Honeycomb and sterile dressing were placed.    Dr. Lovell Sheehan was assisting throughout the procedure and was present for the critical portions of the case including the mobilization and anastomosis.   Final inspection revealed acceptable hemostasis. All counts were correct at the end of the case. The patient was awakened from anesthesia and extubated without complication.  The patient went to the PACU in stable condition. The foley remained in place.    Algis Greenhouse, MD [facility] [address] [place] [place], [place] [address] [phone] (office)

## 2022-09-10 NOTE — Progress Notes (Signed)
[facility]   Updated her niece.  ERAS for pain Binder Telemetry overnight Clear diet Foley in place overnight Labs in AM SCDs, heparin sq Cefotetan X 2 doses post op prophylaxis   Algis Greenhouse, MD [facility] [address] [place] [place], [place] [address] [phone] (office)

## 2022-09-10 NOTE — ED Triage Notes (Signed)
Pt c/o nausea, has been prepping for a colostomy reversal that is scheduled for 0600 today. States she thinks all of the medication she had to take last night has caused her nausea.

## 2022-09-10 NOTE — ED Provider Notes (Signed)
[facility]PART[facility] AT [facility] Provider Note   CSN: [id number] Arrival date & time: 09/10/22  0430     History  Chief Complaint  Patient presents with   Nausea    Yvette Branch is a 77 y.o. female.  Patient is a 77 year old female with past medical history of sigmoid colon perforation with colostomy.  Patient is scheduled for colostomy reversal this morning at 6 AM.  Yesterday she began with the bowel prep including erythromycin, MiraLAX.  Patient presents this morning complaining of nausea.  She believes the bowel prep is making her feel very nauseated and upsetting her stomach.  She denies to me she is having any abdominal pain.  She has not vomited and reports that she is having ostomy outs that are clear.  The history is provided by the patient.       Home Medications Prior to Admission medications   Medication Sig Start Date End Date Taking? Authorizing Provider  Aloe-Sodium Chloride (AYR SALINE NASAL GEL NA) Place 1 Application into the nose 2 (two) times daily.    [provider]  ALPRAZolam Prudy Feeler) 0.25 MG tablet Take 0.25 mg by mouth 2 (two) times daily as needed for anxiety.    [provider]  aspirin EC 81 MG tablet Take 81 mg by mouth daily.    [provider]  Calcium Carbonate-Vit D-Min (RA CALCIUM PLUS MINERALS/VIT D PO) Take 1 tablet by mouth daily.    [provider]  calcium elemental as carbonate (TUMS ULTRA 1000) 400 MG chewable tablet Chew 1,000 mg by mouth daily as needed for heartburn.    [provider]  Carboxymethylcellulose Sodium (THERATEARS EXTRA OP) Place 1 drop into both eyes 4 (four) times daily as needed (dry eyes).    [provider]  Collagen-Boron-Hyaluronic Acid ([employer] JOINT HEALTH TRIPLE ACTION PO) Take 1 tablet by mouth daily.    [provider]  docusate sodium (COLACE) 100 MG capsule Take 1 capsule (100 mg total) by mouth 2 (two) times daily. Patient  taking differently: Take 100 mg by mouth daily. 08/02/14   Malissa Hippo, MD  erythromycin base (E-MYCIN) 500 MG tablet Take 2 tablets (1,000 mg total) by mouth as directed. Take 2 erythromycin 500mg  tablets at 2 pm, 3pm, and 10 pm the day before surgery. 08/18/22   [name], MD  levothyroxine (SYNTHROID, LEVOTHROID) 50 MCG tablet Take 50 mcg by mouth daily before breakfast.  02/17/15   [provider]  Magnesium 400 MG TABS Take 400 mg by mouth 3 (three) times a week.    [provider]  Multiple Vitamins-Minerals (HAIR/SKIN/NAILS/BIOTIN) TABS Take 1 tablet by mouth daily.    [provider]  neomycin (MYCIFRADIN) 500 MG tablet Take 2 tablets (1,000 mg total) by mouth as directed. Take 2 neomycin 500mg  tablets at 2 pm, 3pm, and 10 pm the day before surgery. 08/18/22   [name], MD  neomycin-bacitracin-polymyxin (NEOSPORIN) OINT Apply 1 Application topically daily as needed for wound care.    [provider]  nystatin-triamcinolone (MYCOLOG II) cream Apply 1 Application topically 2 (two) times daily as needed (rash).    [provider]  polyethylene glycol (MIRALAX / GLYCOLAX) 17 g packet Take 17 g by mouth at bedtime. For constipation 11/28/18   Malissa Hippo, MD  Probiotic Product (ALIGN) 4 MG CAPS Take 1 capsule by mouth daily. 01/18/12   Erick Blinks, MD  pyridOXINE (VITAMIN B-6) 100 MG tablet  Take 100 mg by mouth daily.    [provider]  vitamin k 100 MCG tablet Take 100 mcg by mouth daily.    [provider]      Allergies    Doxycycline and Metronidazole    Review of Systems   Review of Systems  All other systems reviewed and are negative.   Physical Exam Updated Vital Signs BP (!) 122/53   Pulse 71   Temp 97.8 F (36.6 C) (Oral)   Resp 16   Ht 5\' 2"  (1.575 m)   Wt 44.9 kg   SpO2 100%   BMI 18.10 kg/m  Physical Exam Vitals and nursing note reviewed.  Constitutional:      General: She is  not in acute distress.    Appearance: She is well-developed. She is not diaphoretic.  HENT:     Head: Normocephalic and atraumatic.  Cardiovascular:     Rate and Rhythm: Normal rate and regular rhythm.     Heart sounds: No murmur heard.    No friction rub. No gallop.  Pulmonary:     Effort: Pulmonary effort is normal. No respiratory distress.     Breath sounds: Normal breath sounds. No wheezing.  Abdominal:     General: Bowel sounds are normal. There is no distension.     Palpations: Abdomen is soft.     Tenderness: There is no abdominal tenderness.  Musculoskeletal:        General: Normal range of motion.     Cervical back: Normal range of motion and neck supple.  Skin:    General: Skin is warm and dry.  Neurological:     General: No focal deficit present.     Mental Status: She is alert and oriented to person, place, and time.     ED Results / Procedures / Treatments   Labs (all labs ordered are listed, but only abnormal results are displayed) Labs Reviewed  COMPREHENSIVE METABOLIC PANEL  CBC WITH DIFFERENTIAL/PLATELET    EKG None  Radiology No results found.  Procedures Procedures    Medications Ordered in ED Medications  sodium chloride 0.9 % bolus 500 mL (has no administration in time range)  ondansetron (ZOFRAN) injection 4 mg (has no administration in time range)    ED Course/ Medical Decision Making/ A&P  Patient is a 77 year old female presenting with complaints of nausea.  She has history of diverticular perforation with colostomy and is scheduled for reversal this morning.  Patient took multiple antibiotics and laxatives yesterday evening and has been nauseated all night.  She presents today with nausea and concerns that this may postpone her surgery.  Patient arrives here with stable vital signs and is afebrile.  Physical examination reveals no abdominal tenderness.  Laboratory studies obtained including CBC and CMP.  Both are basically  unremarkable.  Patient given IV fluids along with IV Zofran and her nausea has significantly improved.  I discussed care with Dr. Henreitta Leber from general surgery and she tells me that this in no way would postpone her surgery.  Patient will be discharged from here and is to go to same-day surgery/preop as scheduled.  Final Clinical Impression(s) / ED Diagnoses Final diagnoses:  None    Rx / DC Orders ED Discharge Orders     None         Geoffery Lyons, MD 09/10/22 [phone]

## 2022-09-10 NOTE — Interval H&P Note (Signed)
History and Physical Interval Note:  09/10/2022 7:21 AM  Yvette Branch  has presented today for surgery, with the diagnosis of COLOSTOMY IN PLACE.  The various methods of treatment have been discussed with the patient and family. After consideration of risks, benefits and other options for treatment, the patient has consented to  Procedure(s): COLOSTOMY REVERSAL (N/A) as a surgical intervention.  The patient's history has been reviewed, patient examined, no change in status, stable for surgery.  I have reviewed the patient's chart and labs.  Questions were answered to the patient's satisfaction.    Went to ED for nausea this Am. Not unexpected with prep esp the antibiotic prep.  Reassured patient.  [name]

## 2022-09-10 NOTE — Discharge Instructions (Signed)
Go to same-day surgery preop for your scheduled surgery.

## 2022-09-10 NOTE — Transfer of Care (Signed)
Immediate Anesthesia Transfer of Care Note  Patient: Yvette Branch  Procedure(s) Performed: COLOSTOMY REVERSAL (Abdomen)  Patient Location: PACU  Anesthesia Type:General  Level of Consciousness: awake and patient cooperative  Airway & Oxygen Therapy: Patient Spontanous Breathing  Post-op Assessment: Report given to RN and Post -op Vital signs reviewed and stable  Post vital signs: Reviewed and stable  Last Vitals:  Vitals Value Taken Time  BP 114/48 09/10/22 1047  Temp 35.8 09/10/22 1058  Pulse 66 09/10/22 1057  Resp 18 09/10/22 1057  SpO2 100 % 09/10/22 1057  Vitals shown include unvalidated device data. Axillary temp. Last Pain:  Vitals:   09/10/22 0651  TempSrc: Oral  PainSc: 0-No pain         Complications: No notable events documented.

## 2022-09-10 NOTE — Progress Notes (Addendum)
Initial Nutrition Assessment  DOCUMENTATION CODES:    underweight  INTERVENTION:  Boost Breeze po TID, each supplement provides 250 kcal and 9 grams of protein   ProSource 30 ml TID (each 30 ml provides 100 kcal, 15 gr protein)   General Soft diet education attached to AVS  RD will follow up Monday to address additional questions (if any arise over the weekend)  Request current weight  NUTRITION DIAGNOSIS:   Food and nutrition related knowledge deficit related to  (patient requested diet information) as evidenced by per patient/family report.   GOAL:   (address patient nutrition related questions)   MONITOR:  Diet advancement, Supplement acceptance  REASON FOR ASSESSMENT:   Consult Diet education  ASSESSMENT: Patient is a 77 yo female with history of sigmoid colon perforation with colostomy.   5/31 status post reversal of colostomy, splenic flexure takedown.   Patient niece is bedside and is active in assisting her as needed in the community.   Clear liquids allowed currently. Patient has tray but is wanting to wait for a while before she drinks anything. She is willing to try a [employer] (which is clear) and protein modular while waiting for diet to progress.  Talked with patient at length today addressing multiple, various, diet related questions. Diet recall reviewed. She is feeling anxious about what to eat when she goes home. Her diet has been limited due to affects of certain foods on her colostomy. She is very concerned about her blood sugar and says she had been told she was pre-diabetic.   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 Attempted to put her mind at ease. Encouraged protein each meal and high protein oral nutrition supplements to support weight gain and repletion following surgery. She has been drinking Boost High Protein at home at least 2x daily.   Weights-  5/8-44.9 kg.  1/28- 48.5 kg  Will request current weight to assess for change.   Meds:  florstor, celebrex     Latest Ref Rng & Units 09/10/2022    4:53 AM 09/08/2022    1:06 PM 06/27/2022    3:06 AM  BMP  Glucose 70 - 99 mg/dL 454  098  119   BUN 8 - 23 mg/dL 16  23  18    Creatinine 0.44 - 1.00 mg/dL 1.47  8.29  5.62   Sodium 135 - 145 mmol/L 129  136  135   Potassium 3.5 - 5.1 mmol/L 3.5  4.0  3.4   Chloride 98 - 111 mmol/L 94  101  97   CO2 22 - 32 mmol/L 22  22  29    Calcium 8.9 - 10.3 mg/dL 9.5  9.4  9.8       NUTRITION - FOCUSED PHYSICAL EXAM: Deferred    Diet Order:   Diet Order             Diet clear liquid Room service appropriate? Yes; Fluid consistency: Thin  Diet effective now                   EDUCATION NEEDS:  Education needs have been addressed  Skin:  Skin Assessment: Skin Integrity Issues: Skin Integrity Issues:: Incisions Incisions: 5/31 abdominal incision  Last BM:  5/30  Height:   Ht Readings from Last 1 Encounters:  09/10/22 5\' 2"  (1.575 m)    Weight:   Wt Readings from Last 1 Encounters:  09/10/22 44.9 kg    Ideal Body Weight:   50 kg  BMI:  Body mass index is 18.1 kg/m.  Estimated Nutritional Needs:   Kcal:  1600-1800  Protein:  70-80 gr  Fluid:  1500 ml daily  Royann Shivers MS,RD,CSG,LDN Contact: Loretha Stapler

## 2022-09-10 NOTE — Anesthesia Preprocedure Evaluation (Signed)
Anesthesia Evaluation  Patient identified by MRN, date of birth, ID band Patient awake    Reviewed: Allergy & Precautions, H&P , NPO status , Patient's Chart, lab work & pertinent test results, reviewed documented beta blocker date and time   History of Anesthesia Complications (+) history of anesthetic complications  Airway Mallampati: II  TM Distance: >3 FB Neck ROM: full    Dental no notable dental hx.    Pulmonary neg pulmonary ROS   Pulmonary exam normal breath sounds clear to auscultation       Cardiovascular Exercise Tolerance: Good negative cardio ROS  Rhythm:regular Rate:Normal     Neuro/Psych  PSYCHIATRIC DISORDERS Anxiety Depression    negative neurological ROS  negative psych ROS   GI/Hepatic negative GI ROS, Neg liver ROS,GERD  ,,  Endo/Other  negative endocrine ROSHypothyroidism    Renal/GU negative Renal ROS  negative genitourinary   Musculoskeletal   Abdominal   Peds  Hematology negative hematology ROS (+)   Anesthesia Other Findings   Reproductive/Obstetrics negative OB ROS                             Anesthesia Physical Anesthesia Plan  ASA: 2  Anesthesia Plan: General and General ETT   Post-op Pain Management:    Induction:   PONV Risk Score and Plan: Ondansetron  Airway Management Planned:   Additional Equipment:   Intra-op Plan:   Post-operative Plan:   Informed Consent: I have reviewed the patients History and Physical, chart, labs and discussed the procedure including the risks, benefits and alternatives for the proposed anesthesia with the patient or authorized representative who has indicated his/her understanding and acceptance.     Dental Advisory Given  Plan Discussed with: CRNA  Anesthesia Plan Comments:        Anesthesia Quick Evaluation

## 2022-09-11 LAB — BASIC METABOLIC PANEL
Anion gap: 6 (ref 5–15)
BUN: 9 mg/dL (ref 8–23)
CO2: 25 mmol/L (ref 22–32)
Calcium: 8.3 mg/dL — ABNORMAL LOW (ref 8.9–10.3)
Chloride: 101 mmol/L (ref 98–111)
Creatinine, Ser: 0.6 mg/dL (ref 0.44–1.00)
GFR, Estimated: >60 mL/min (ref >60)
Glucose, Bld: 147 mg/dL — ABNORMAL HIGH (ref 70–99)
Potassium: 3.8 mmol/L (ref 3.5–5.1)
Sodium: 132 mmol/L — ABNORMAL LOW (ref 135–145)

## 2022-09-11 LAB — CBC
HCT: 31.8 % — ABNORMAL LOW (ref 36.0–46.0)
Hemoglobin: 10.2 g/dL — ABNORMAL LOW (ref 12.0–15.0)
MCH: 28.5 pg (ref 26.0–34.0)
MCHC: 32.1 g/dL (ref 30.0–36.0)
MCV: 88.8 fL (ref 80.0–100.0)
Platelets: 206 10*3/uL (ref 150–400)
RBC: 3.58 MIL/uL — ABNORMAL LOW (ref 3.87–5.11)
RDW: 13.2 % (ref 11.5–15.5)
WBC: 16.3 10*3/uL — ABNORMAL HIGH (ref 4.0–10.5)
nRBC: 0 % (ref 0.0–0.2)

## 2022-09-11 LAB — PHOSPHORUS: Phosphorus: 2.4 mg/dL — ABNORMAL LOW (ref 2.5–4.6)

## 2022-09-11 LAB — MAGNESIUM: Magnesium: 1.7 mg/dL (ref 1.7–2.4)

## 2022-09-11 MED ORDER — POTASSIUM & SODIUM PHOSPHATES 280-160-250 MG PO PACK
1.0000 | PACK | Freq: Three times a day (TID) | ORAL | Status: AC
  Administered 2022-09-11 (×3): 1 via ORAL
  Filled 2022-09-11 (×3): qty 1

## 2022-09-11 MED ORDER — MAGNESIUM SULFATE IN D5W 1-5 GM/100ML-% IV SOLN
1.0000 g | Freq: Once | INTRAVENOUS | Status: AC
  Administered 2022-09-11: 1 g via INTRAVENOUS
  Filled 2022-09-11: qty 100

## 2022-09-11 MED ORDER — MAGNESIUM SULFATE 2 GM/50ML IV SOLN
2.0000 g | Freq: Once | INTRAVENOUS | Status: AC
  Administered 2022-09-11: 2 g via INTRAVENOUS
  Filled 2022-09-11: qty 50

## 2022-09-11 MED ORDER — MAGNESIUM SULFATE 2 GM/50ML IV SOLN: 2.0000 g | Freq: Once | INTRAVENOUS | Status: DC

## 2022-09-11 MED ORDER — MAGNESIUM SULFATE 50 % IJ SOLN: 3.0000 g | Freq: Once | INTRAVENOUS | Status: DC

## 2022-09-11 NOTE — Progress Notes (Signed)
Patient alert and verbal, ambulated in the hallway during shift with stand by assist. Patient has had three small loose bowel movements, and voided several times during shift. Patient verbalized some complaints of pain to abdomen, given scheduled Tylenol which was effective.

## 2022-09-11 NOTE — Progress Notes (Signed)
Patient ambulated in hallway, patients gait steady. Patient had a small loose bowel movement, some small blood noted. MD Henreitta Leber made aware. No new orders.

## 2022-09-11 NOTE — Progress Notes (Signed)
[facility] Progress Note  1 Day Post-Op  Subjective: Taking no narcotics, doing ok just sore. Nutrition requested by patient yesterday and saw her, recommendations appreciated. No BM or flatus. Tolerating clears.   Objective: Vital signs in last 24 hours: Temp:  [96.4 F (35.8 C)-98 F (36.7 C)] 97.7 F (36.5 C) (06/01 0434) Pulse Rate:  [62-74] 62 (06/01 0434) Resp:  [10-19] 19 (06/01 0434) BP: (102-123)/(48-77) 122/64 (06/01 0906) SpO2:  [97 %-100 %] 99 % (06/01 0434) Weight:  [54 kg] 54 kg (06/01 0434) Last BM Date : 09/09/22  Intake/Output from previous day: 05/31 0701 - 06/01 0700 In: 2738.8 [P.O.:480; I.V.:2158.2; IV Piggyback:100.6] Out: 500 [Urine:350; Blood:150] Intake/Output this shift: No intake/output data recorded.  General appearance: alert and no distress Resp: normal work of breathing GI: soft, nondistended, appropriately tender, binder in place, colostomy site with old staining on gauze, honeycomb midline   Lab Results:  Recent Labs    09/10/22 0453 09/11/22 0413  WBC 7.1 16.3*  HGB 12.6 10.2*  HCT 38.4 31.8*  PLT 265 206   BMET Recent Labs    09/10/22 0453 09/11/22 0413  NA 129* 132*  K 3.5 3.8  CL 94* 101  CO2 22 25  GLUCOSE 104* 147*  BUN 16 9  CREATININE 0.70 0.60  CALCIUM 9.5 8.3*   PT/INR No results for input(s): "LABPROT", "INR" in the last 72 hours.  Studies/Results: No results found.  Anti-infectives: Anti-infectives (From admission, onward)    Start     Dose/Rate Route Frequency Ordered Stop   09/10/22 1800  cefoTEtan (CEFOTAN) 2 g in sodium chloride 0.9 % 100 mL IVPB        2 g 200 mL/hr over 30 Minutes Intravenous Every 12 hours 09/10/22 1211 09/11/22 2159   09/10/22 0630  cefoTEtan (CEFOTAN) 2 g in sodium chloride 0.9 % 100 mL IVPB        2 g 200 mL/hr over 30 Minutes Intravenous On call to O.R. 09/10/22 1610 09/10/22 9604       Assessment/Plan: Patient s/p colostomy reversal. Doing well.  Awaiting bowel function. ERAS for pain control IS, OOB Needs to ambulate Monitor can expire today Clear diet Awaiting bowel function, entereg Labs tomorrow, WBC up post op, cefotetan x 2  doses post op for prophylaxis  H&H drifted down, no signs of bleeding  K replaced. Mag replaced UOP good, foley out, LR @ 50  SCDs, heparin sq     LOS: 1 day    [name] 09/11/2022

## 2022-09-12 LAB — CBC WITH DIFFERENTIAL/PLATELET
Abs Immature Granulocytes: 0.08 10*3/uL — ABNORMAL HIGH (ref 0.00–0.07)
Basophils Absolute: 0 10*3/uL (ref 0.0–0.1)
Basophils Relative: 0 %
Eosinophils Absolute: 0 10*3/uL (ref 0.0–0.5)
Eosinophils Relative: 0 %
HCT: 32 % — ABNORMAL LOW (ref 36.0–46.0)
Hemoglobin: 10.2 g/dL — ABNORMAL LOW (ref 12.0–15.0)
Immature Granulocytes: 1 %
Lymphocytes Relative: 13 %
Lymphs Abs: 1.7 10*3/uL (ref 0.7–4.0)
MCH: 29 pg (ref 26.0–34.0)
MCHC: 31.9 g/dL (ref 30.0–36.0)
MCV: 90.9 fL (ref 80.0–100.0)
Monocytes Absolute: 0.9 10*3/uL (ref 0.1–1.0)
Monocytes Relative: 7 %
Neutro Abs: 10.6 10*3/uL — ABNORMAL HIGH (ref 1.7–7.7)
Neutrophils Relative %: 79 %
Platelets: 207 10*3/uL (ref 150–400)
RBC: 3.52 MIL/uL — ABNORMAL LOW (ref 3.87–5.11)
RDW: 13.9 % (ref 11.5–15.5)
WBC: 13.3 10*3/uL — ABNORMAL HIGH (ref 4.0–10.5)
nRBC: 0 % (ref 0.0–0.2)

## 2022-09-12 LAB — BASIC METABOLIC PANEL
Anion gap: 7 (ref 5–15)
BUN: 10 mg/dL (ref 8–23)
CO2: 28 mmol/L (ref 22–32)
Calcium: 8 mg/dL — ABNORMAL LOW (ref 8.9–10.3)
Chloride: 104 mmol/L (ref 98–111)
Creatinine, Ser: 0.7 mg/dL (ref 0.44–1.00)
GFR, Estimated: >60 mL/min (ref >60)
Glucose, Bld: 108 mg/dL — ABNORMAL HIGH (ref 70–99)
Potassium: 3.6 mmol/L (ref 3.5–5.1)
Sodium: 139 mmol/L (ref 135–145)

## 2022-09-12 LAB — PHOSPHORUS: Phosphorus: 3 mg/dL (ref 2.5–4.6)

## 2022-09-12 LAB — MAGNESIUM: Magnesium: 2.2 mg/dL (ref 1.7–2.4)

## 2022-09-12 MED ORDER — ENOXAPARIN SODIUM 40 MG/0.4ML IJ SOSY
40.0000 mg | PREFILLED_SYRINGE | INTRAMUSCULAR | Status: DC
  Administered 2022-09-12 – 2022-09-14 (×3): 40 mg via SUBCUTANEOUS
  Filled 2022-09-12 (×3): qty 0.4

## 2022-09-12 MED ORDER — POTASSIUM CHLORIDE 20 MEQ PO PACK
40.0000 meq | PACK | Freq: Once | ORAL | Status: AC
  Administered 2022-09-12: 40 meq via ORAL
  Filled 2022-09-12: qty 2

## 2022-09-12 NOTE — Progress Notes (Signed)
[facility] Progress Note  2 Days Post-Op  Subjective: Had Bms. Says felt like her face is numb and she is more  tired. She is worried its from the celebrex or entereg. Discussed with pharmacy, seems unlikely given the reported symptoms to be related to meds. Discussed with patient could be from fluid and feeling "puffy" or could just be from her baseline anxiety.  She took some xanax this am. Home dose. Ambulated yesterday well.    Objective: Vital signs in last 24 hours: Temp:  [97.9 F (36.6 C)-98.1 F (36.7 C)] 97.9 F (36.6 C) (06/02 0335) Pulse Rate:  [58-70] 70 (06/02 0929) Resp:  [18-20] 18 (06/02 0335) BP: (117-134)/(54-66) 123/58 (06/02 0929) SpO2:  [99 %-100 %] 100 % (06/02 0335) Weight:  [48.5 kg] 48.5 kg (06/02 0443) Last BM Date : 09/12/22  Intake/Output from previous day: 06/01 0701 - 06/02 0700 In: 1800.6 [P.O.:720; I.V.:930.6; IV Piggyback:150] Out: 1050 [Urine:1050] Intake/Output this shift: No intake/output data recorded.  General appearance: alert and no distress Resp: normal work of breathing GI: soft, nondistended, appropriately tender, dressing removed, s taples c/d/I with no erythema or drainage, some macerated skin under the colostomy dressing, monitor   Lab Results:  Recent Labs    09/11/22 0413 09/12/22 0423  WBC 16.3* 13.3*  HGB 10.2* 10.2*  HCT 31.8* 32.0*  PLT 206 207   BMET Recent Labs    09/11/22 0413 09/12/22 0423  NA 132* 139  K 3.8 3.6  CL 101 104  CO2 25 28  GLUCOSE 147* 108*  BUN 9 10  CREATININE 0.60 0.70  CALCIUM 8.3* 8.0*   PT/INR No results for input(s): "LABPROT", "INR" in the last 72 hours.  Studies/Results: No results found.  Anti-infectives: Anti-infectives (From admission, onward)    Start     Dose/Rate Route Frequency Ordered Stop   09/10/22 1800  cefoTEtan (CEFOTAN) 2 g in sodium chloride 0.9 % 100 mL IVPB        2 g 200 mL/hr over 30 Minutes Intravenous Every 12 hours 09/10/22 1211  09/11/22 1020   09/10/22 0630  cefoTEtan (CEFOTAN) 2 g in sodium chloride 0.9 % 100 mL IVPB        2 g 200 mL/hr over 30 Minutes Intravenous On call to O.R. 09/10/22 1610 09/10/22 9604       Assessment/Plan: Patient s/p colostomy reversal. Doing well. Had multiple Bms.  ERAS for pain control Stopped celebrex due to her concern  IS, OOB Ambulate  Full liquid diet  Entereg off  WBC down, H&H stable  K replaced.  UOP good SCDs, lovenox from heparin  IVF off     LOS: 2 days    Yvette Branch 09/12/2022

## 2022-09-12 NOTE — Progress Notes (Signed)
Nursing Report Nightshift:  Pt ambulated in hall with assistance. Small loose stool X 4. Voiding and drinking. A & O x 4. Pain controlled with scheduled Tylenol. Honeycomb dressing to mid abdomen clean/dry/intact.

## 2022-09-12 NOTE — Progress Notes (Addendum)
Pt ambulated during this shift. @ aprox 1145 pt presented with complaints of facial numbness and appeared to be very anxious. Pt states she was concerned because this was sudden onset and after her morning medications when she began to feel this way. MD informed and made aware. Pharmacy informed. Later in shift patient no longer had facial numbness present. Minimal pain throughout shift scheduled tylenol given see MAR. Vitals stable see flowsheet.

## 2022-09-13 LAB — CBC WITH DIFFERENTIAL/PLATELET
Abs Immature Granulocytes: 0.03 10*3/uL (ref 0.00–0.07)
Basophils Absolute: 0 10*3/uL (ref 0.0–0.1)
Basophils Relative: 0 %
Eosinophils Absolute: 0.2 10*3/uL (ref 0.0–0.5)
Eosinophils Relative: 3 %
HCT: 32.3 % — ABNORMAL LOW (ref 36.0–46.0)
Hemoglobin: 10 g/dL — ABNORMAL LOW (ref 12.0–15.0)
Immature Granulocytes: 0 %
Lymphocytes Relative: 21 %
Lymphs Abs: 1.9 10*3/uL (ref 0.7–4.0)
MCH: 28.3 pg (ref 26.0–34.0)
MCHC: 31 g/dL (ref 30.0–36.0)
MCV: 91.5 fL (ref 80.0–100.0)
Monocytes Absolute: 0.7 10*3/uL (ref 0.1–1.0)
Monocytes Relative: 7 %
Neutro Abs: 6.1 10*3/uL (ref 1.7–7.7)
Neutrophils Relative %: 69 %
Platelets: 200 10*3/uL (ref 150–400)
RBC: 3.53 MIL/uL — ABNORMAL LOW (ref 3.87–5.11)
RDW: 14 % (ref 11.5–15.5)
WBC: 8.9 10*3/uL (ref 4.0–10.5)
nRBC: 0 % (ref 0.0–0.2)

## 2022-09-13 LAB — BASIC METABOLIC PANEL
Anion gap: 5 (ref 5–15)
BUN: 17 mg/dL (ref 8–23)
CO2: 27 mmol/L (ref 22–32)
Calcium: 8 mg/dL — ABNORMAL LOW (ref 8.9–10.3)
Chloride: 104 mmol/L (ref 98–111)
Creatinine, Ser: 0.61 mg/dL (ref 0.44–1.00)
GFR, Estimated: >60 mL/min (ref >60)
Glucose, Bld: 97 mg/dL (ref 70–99)
Potassium: 4.2 mmol/L (ref 3.5–5.1)
Sodium: 136 mmol/L (ref 135–145)

## 2022-09-13 LAB — PHOSPHORUS: Phosphorus: 2.7 mg/dL (ref 2.5–4.6)

## 2022-09-13 MED ORDER — ORAL CARE MOUTH RINSE: 15.0000 mL | OROMUCOSAL | Status: DC | PRN

## 2022-09-13 NOTE — Progress Notes (Signed)
Mobility Specialist Progress Note:   09/13/22 1346  Mobility  Activity Ambulated independently in room;Ambulated independently to bathroom;Ambulated with assistance in hallway  Level of Assistance Standby assist, set-up cues, supervision of patient - no hands on  Assistive Device None  Distance Ambulated (ft) 1000 ft  Range of Motion/Exercises Active;All extremities  Activity Response Tolerated well  Mobility Referral Yes  $Mobility charge 1 Mobility  Mobility Specialist Start Time (ACUTE ONLY) 1315  Mobility Specialist Stop Time (ACUTE ONLY) 1346  Mobility Specialist Time Calculation (min) (ACUTE ONLY) 31 min   Pt eager for mobility, received ambulating in room and to bathroom independently. Ambulated in hallway via SB, no AD required. Tolerated session well, asx throughout. Returned pt to room, sitting up in chair, all needs met.   Feliciana Rossetti Mobility Specialist Please contact via Special educational needs teacher or  Rehab office at [phone]

## 2022-09-13 NOTE — Progress Notes (Signed)
Patient ambulated in hallway with mobility specialist and staff. Patient ambulated independently in room, patient steady while ambulating. Tolerated soft diet with no complaints. Abdominal dressing intact and abdominal binder in place.

## 2022-09-13 NOTE — Progress Notes (Addendum)
Nutrition Follow-up  DOCUMENTATION CODES:      INTERVENTION:  Encourage adequate protein and PO intake with meals Discontinue Boost Breeze and ProSource Continue Ensure Surgery BID as ordered, each supplement provides 330 kcal and 18g protein.  NUTRITION DIAGNOSIS:   Food and nutrition related knowledge deficit related to  (patient requested diet information) as evidenced by per patient/family report. - remains ongoing  GOAL:    (address patient nutrition related questions) - continuing to address as appropriate  MONITOR:   Diet advancement, Supplement acceptance  REASON FOR ASSESSMENT:   Consult Diet education  ASSESSMENT:   Patient is a 77 yo female with history of sigmoid colon perforation with colostomy.  5/31 status post reversal of colostomy, splenic flexure takedown.   6/2 diet advanced to full liquids 6/3 diet advanced to soft   Noted plans to d/c to home with home health.   RD working remotely. Called and spoke with pt via phone call to room. She endorses ongoing concern with what to consume when she goes home. She recalls tolerating PO intake over the weekend but is eating lightly d/t fear of intolerance. She has been tolerating Boost Breeze and ProSource but would prefer to only consume Ensure now that her diet has been advanced past clear liquids.   Meal completions:  6/1: 50% breakfast, lunch and dinner (full liquids) 6/2: no documented meal completions on file 6/3: 75% lunch (soft diet)  Medications: MVI, florastor  Labs reviewed  Diet Order:   Diet Order             DIET SOFT Fluid consistency: Thin  Diet effective now                   EDUCATION NEEDS:   Education needs have been addressed  Skin:  Skin Assessment: Skin Integrity Issues: Skin Integrity Issues:: Incisions Incisions: 5/31 abdominal incision  Last BM:  6/3 (type 7 small)  Height:   Ht Readings from Last 1 Encounters:  09/13/22 5\' 2"  (1.575 m)    Weight:    Wt Readings from Last 1 Encounters:  09/13/22 48.7 kg   BMI:  Body mass index is 19.64 kg/m.  Estimated Nutritional Needs:   Kcal:  1600-1800  Protein:  70-80 gr  Fluid:  1500 ml daily  Drusilla Kanner, RDN, LDN Clinical Nutrition

## 2022-09-13 NOTE — TOC Initial Note (Signed)
Transition of Care [facility]) - Initial/Assessment Note    Patient Details  Name: Yvette Branch MRN: [id number] Date of Birth: 1945/08/14  Transition of Care [facility]) CM/SW Contact:    Villa Herb, LCSWA Phone Number: 09/13/2022, 3:30 PM  Clinical Narrative:                 CSW noted per chart review that pt was set up with [facility] through [facility] during last hospital admission. CSW spoke to [place] with [facility] who confirms pt is active with them for [facility] RN. Pt will need new HH orders prior to D/C. TOC to follow.   Expected Discharge Plan: Home w Home Health Services Barriers to Discharge: Continued Medical Work up   Patient Goals and CMS Choice Patient states their goals for this hospitalization and ongoing recovery are:: return home CMS Medicare.gov Compare Post Acute Care list provided to:: Patient Choice offered to / list presented to : Patient      Expected Discharge Plan and Services In-house Referral: Clinical Social Work Discharge Planning Services: CM Consult Post Acute Care Choice: Home Health Living arrangements for the past 2 months: Single Family Home                           HH Arranged: RN HH Agency: [facility] Date [facility] Agency Contacted: 09/13/22   Representative spoke with at [facility] Agency: Kandee Keen  Prior Living Arrangements/Services Living arrangements for the past 2 months: Single Family Home Lives with:: Self Patient language and need for interpreter reviewed:: Yes Do you feel safe going back to the place where you live?: Yes      Need for Family Participation in Patient Care: Yes (Comment) Care giver support system in place?: Yes (comment) Current home services: Home RN Criminal Activity/Legal Involvement Pertinent to Current Situation/Hospitalization: No - Comment as needed  Activities of Daily Living Home Assistive Devices/Equipment: None ADL Screening (condition at time of admission) Patient's cognitive ability adequate to safely complete daily  activities?: Yes Is the patient deaf or have difficulty hearing?: No Does the patient have difficulty seeing, even when wearing glasses/contacts?: No Does the patient have difficulty concentrating, remembering, or making decisions?: No Patient able to express need for assistance with ADLs?: Yes Does the patient have difficulty dressing or bathing?: No Independently performs ADLs?: Yes (appropriate for developmental age) Does the patient have difficulty walking or climbing stairs?: No Weakness of Legs: None Weakness of Arms/Hands: None  Permission Sought/Granted                  Emotional Assessment Appearance:: Appears stated age       Alcohol / Substance Use: Not Applicable Psych Involvement: No (comment)  Admission diagnosis:  Colostomy in place [facility]) [Z93.3] Patient Active Problem List   Diagnosis Date Noted   Colostomy in place [facility]) 09/10/2022   History of creation of ostomy (HCC) 06/14/2022   Perforated abdominal viscus 05/09/2022   Colitis 05/09/2022   Perforation of sigmoid colon (HCC) 05/09/2022   Gallstones 01/12/2022   Constipation 11/28/2018   History of diverticulitis 11/28/2018   Anal skin tag 11/28/2018   Candidiasis of vulva and vagina 09/18/2013   Lichen sclerosus et atrophicus 07/03/2013   Vaginitis and vulvovaginitis, unspecified 06/06/2013   Hypoglycemia 02/12/2013   SBO (small bowel obstruction) (HCC) 02/09/2013   Hypercalcemia 02/09/2013   Anxiety    GERD (gastroesophageal reflux disease)    Hypokalemia 01/11/2012   Malnutrition, calorie 01/11/2012  PCP:  Assunta Found, MD Pharmacy:   [facility] [place] [place] - [address] [place] [address] Phone: [phone] Fax: [phone]     Social Determinants of Health (SDOH) Social History: SDOH Screenings   Food Insecurity: No Food Insecurity (09/10/2022)  Housing: Low Risk  (09/10/2022)  Transportation Needs: No Transportation Needs (09/10/2022)   Utilities: Not At Risk (09/10/2022)  Tobacco Use: Low Risk  (09/10/2022)   SDOH Interventions:     Readmission Risk Interventions     No data to display

## 2022-09-13 NOTE — Progress Notes (Signed)
[facility] Progress Note  3 Days Post-Op  Subjective: Eating and having Bms. Ambulating. No major pain.   Objective: Vital signs in last 24 hours: Temp:  [97.6 F (36.4 C)-98 F (36.7 C)] 97.7 F (36.5 C) (06/03 0333) Pulse Rate:  [58-88] 88 (06/03 0333) Resp:  [16] 16 (06/03 0333) BP: (107-130)/(51-57) 130/57 (06/03 0333) SpO2:  [97 %-100 %] 100 % (06/03 0333) Weight:  [48.7 kg] 48.7 kg (06/03 0210) Last BM Date : 09/13/22  Intake/Output from previous day: 06/02 0701 - 06/03 0700 In: 480 [P.O.:480] Out: -  Intake/Output this shift: No intake/output data recorded.  General appearance: alert and no distress Resp: normal work of breathing GI: soft, nondistended, staples c/d/I with midline without erythema or drainage, ostomy site with skin maceration, no extending erythema, no drainage, moisturizing lotion applied, ABD and tape over staple lines for comfort, binder   Lab Results:  Recent Labs    09/12/22 0423 09/13/22 0458  WBC 13.3* 8.9  HGB 10.2* 10.0*  HCT 32.0* 32.3*  PLT 207 200   BMET Recent Labs    09/12/22 0423 09/13/22 0458  NA 139 136  K 3.6 4.2  CL 104 104  CO2 28 27  GLUCOSE 108* 97  BUN 10 17  CREATININE 0.70 0.61  CALCIUM 8.0* 8.0*   PT/INR No results for input(s): "LABPROT", "INR" in the last 72 hours.  Studies/Results: No results found.  Anti-infectives: Anti-infectives (From admission, onward)    Start     Dose/Rate Route Frequency Ordered Stop   09/10/22 1800  cefoTEtan (CEFOTAN) 2 g in sodium chloride 0.9 % 100 mL IVPB        2 g 200 mL/hr over 30 Minutes Intravenous Every 12 hours 09/10/22 1211 09/11/22 1020   09/10/22 0630  cefoTEtan (CEFOTAN) 2 g in sodium chloride 0.9 % 100 mL IVPB        2 g 200 mL/hr over 30 Minutes Intravenous On call to O.R. 09/10/22 1610 09/10/22 9604       Assessment/Plan: Patient s/p colostomy reversal. Doing well. Had multiple Bms.  ERAS for pain control IS, OOB Ambulate   Soft diet  No labs needed tomorrow  UOP good SCDs, lovenox   I LOS: 3 days    Yvette Branch 09/13/2022

## 2022-09-14 ENCOUNTER — Encounter (HOSPITAL_COMMUNITY): Payer: Self-pay | Admitting: General Surgery

## 2022-09-14 MED ORDER — DOCUSATE SODIUM 100 MG PO CAPS
100.0000 mg | ORAL_CAPSULE | Freq: Every day | ORAL | Status: DC
  Administered 2022-09-14 – 2022-09-15 (×2): 100 mg via ORAL
  Filled 2022-09-14 (×2): qty 1

## 2022-09-14 MED ORDER — ENSURE ENLIVE PO LIQD
237.0000 mL | Freq: Two times a day (BID) | ORAL | Status: DC
  Administered 2022-09-14 – 2022-09-15 (×4): 237 mL via ORAL

## 2022-09-14 NOTE — Progress Notes (Signed)
Nutrition Brief Note  Received secure chat from RN. Pt prefers Ensure Enlive/Ensure Plus over Ensure Surgery, order adjusted.   Yvette Branch, RDN, LDN Clinical Nutrition

## 2022-09-14 NOTE — Progress Notes (Signed)
Mobility Specialist Progress Note:    09/14/22 1158  Mobility  Activity Ambulated independently in hallway  Level of Assistance Standby assist, set-up cues, supervision of patient - no hands on  Assistive Device None  Distance Ambulated (ft) 800 ft  Range of Motion/Exercises Active;All extremities  Activity Response Tolerated well  Mobility Referral Yes  $Mobility charge 1 Mobility  Mobility Specialist Start Time (ACUTE ONLY) 1130  Mobility Specialist Stop Time (ACUTE ONLY) 1158  Mobility Specialist Time Calculation (min) (ACUTE ONLY) 28 min   Pt eager for mobility session, received in bed. Ambulated independently in hallway, no AD. SBA for safety. Tolerated well, asx throughout. Returned pt to room, all needs met, sitting up in chair.   Feliciana Rossetti Mobility Specialist Please contact via Special educational needs teacher or  Rehab office at [phone]

## 2022-09-14 NOTE — Progress Notes (Signed)
[facility]  Feels more sore today. Is worried about her Bms and wants to get back on colace. Eating, no nausea. Says the gabapentin making her feel loopy and is not taking any narcotic.  BP 121/60 (BP Location: Left Arm)   Pulse 66   Temp 98.3 F (36.8 C) (Oral)   Resp 16   Ht 5\' 2"  (1.575 m)   Wt 46.9 kg   SpO2 98%   BMI 18.89 kg/m  Soft, nondistended, nontender, colostomy site with maceration, marked to ensure not changing and barrier ointment placed, ABD over midline and colostomy staples, no drainage  Patients/p colostomy reversal. More sore today. Is going home to be by her self with help from her niece.  Stopped gabapentin Encouraged Roxicodone use if needed  IS, OOB Ambulate  Soft diet  No labs needed tomorrow  UOP good SCDs, lovenox  Home tomorrow Will look at macerated wound one more time  Get her back on her home colace  Algis Greenhouse, MD [facility] [address] [place], [place] [address] [phone] (office)

## 2022-09-15 ENCOUNTER — Other Ambulatory Visit: Payer: Self-pay | Admitting: *Deleted

## 2022-09-15 DIAGNOSIS — Z939 Artificial opening status, unspecified: Secondary | ICD-10-CM

## 2022-09-15 LAB — TYPE AND SCREEN
Antibody Screen: NEGATIVE
Unit division: 0

## 2022-09-15 LAB — BPAM RBC
Blood Product Expiration Date: 202406232359
Blood Product Expiration Date: 202407022359
Unit Type and Rh: 5100

## 2022-09-15 MED ORDER — OXYCODONE HCL 5 MG PO TABS: 5.0000 mg | ORAL_TABLET | ORAL | 0 refills | Status: DC | PRN

## 2022-09-15 MED ORDER — ONDANSETRON HCL 4 MG PO TABS: 4.0000 mg | ORAL_TABLET | Freq: Four times a day (QID) | ORAL | 0 refills | Status: DC | PRN

## 2022-09-15 MED ORDER — FLUCONAZOLE 100 MG PO TABS
200.0000 mg | ORAL_TABLET | Freq: Every day | ORAL | Status: AC
  Administered 2022-09-15: 200 mg via ORAL
  Filled 2022-09-15: qty 2

## 2022-09-15 NOTE — Discharge Summary (Addendum)
Physician Discharge Summary  Patient ID: Yvette Branch MRN: [id number] DOB/AGE: 1945-08-24 77 y.o.  Admit date: 09/10/2022 Discharge date: 09/15/2022  Admission Diagnoses:  Discharge Diagnoses:  Principal Problem:   History of creation of ostomy [facility]) Active Problems:   Colostomy in place [facility])   Discharged Condition: good  Hospital Course: Yvette Branch is a 77 yo with a colostomy from perforated diverticulitis. We waited over 3 months and brought her back for reversal. She did well post operatively. She had adequate pain control and was ambulating. She had her diet advanced and started to have bowel movements. She did have some soreness and anxiety that kept her here to ensure she was able to get around and move since she was planning on going home.  She has some maceration on the ostomy site that we have been watching that is improving. It does not appear to be infection. She does have some skin breakdown/ yeast under her breast and given this I did give her 200 mg of diflucan before discharge and this could help her ostomy site too. She has bene placing some barrier ointment on the area and covering it for comfort.   She knows to monitor the ostomy area. The binder is aggravating her and rubbing under her breast so she is not  going to wear it as much.   She was seen by nutrition at her request during her stay due to weight loss during this time of her colostomy. She also had her [facility] RN renewed so they can check on her post op but I told her I do not think they will be seeing her for many visits as she has no wound at this time.       Consults: Nutrition- patient requested as she has been losing weight, they have made recommendations for ensure and the patient will use these to help with her weight   Significant Diagnostic Studies:   Latest Reference Range & Units 09/11/22 04:13 09/12/22 04:23 09/13/22 04:58  WBC 4.0 - 10.5 K/uL 16.3 (H) 13.3 (H) 8.9  RBC 3.87 - 5.11 MIL/uL 3.58 (L) 3.52  (L) 3.53 (L)  Hemoglobin 12.0 - 15.0 g/dL 40.9 (L) 81.1 (L) 91.4 (L)  HCT 36.0 - 46.0 % 31.8 (L) 32.0 (L) 32.3 (L)  MCV 80.0 - 100.0 fL 88.8 90.9 91.5  MCH 26.0 - 34.0 pg 28.5 29.0 28.3  MCHC 30.0 - 36.0 g/dL 78.2 95.6 21.3  RDW 08.6 - 15.5 % 13.2 13.9 14.0  Platelets 150 - 400 K/uL 206 207 200  nRBC 0.0 - 0.2 % 0.0 0.0 0.0  (H): Data is abnormally high (L): Data is abnormally low Treatments: IV hydration and colostomy reversal 09/10/22  Discharge Exam: Blood pressure 134/63, pulse (!) 58, temperature 97.8 F (36.6 C), temperature source Oral, resp. rate 17, height 5\' 2"  (1.575 m), weight 45.4 kg, SpO2 98 %. General appearance: alert and no distress Resp: normal work of breathing GI: soft, nondistended, appropriately tender, ostomy site maceration improved, no drainage, appropriately tender, some breakdown yeast under breast   Disposition: Discharge disposition: 01-Home or Self Care       Discharge Instructions     Call MD for:  difficulty breathing, headache or visual disturbances   Complete by: As directed    Call MD for:  extreme fatigue   Complete by: As directed    Call MD for:  persistant dizziness or light-headedness   Complete by: As directed    Call MD for:  persistant  nausea and vomiting   Complete by: As directed    Call MD for:  redness, tenderness, or signs of infection (pain, swelling, redness, odor or green/yellow discharge around incision site)   Complete by: As directed    Call MD for:  severe uncontrolled pain   Complete by: As directed    Call MD for:  temperature >100.4   Complete by: As directed    Increase activity slowly   Complete by: As directed       Allergies as of 09/15/2022       Reactions   Doxycycline Other (See Comments)   Bad vision   Metronidazole Hives, Diarrhea, Rash   Numbness in face        Medication List     TAKE these medications    Align 4 MG Caps Take 1 capsule by mouth daily.   ALPRAZolam 0.25 MG  tablet Commonly known as: XANAX Take 0.25 mg by mouth 2 (two) times daily as needed for anxiety.   aspirin EC 81 MG tablet Take 81 mg by mouth daily.   AYR SALINE NASAL GEL NA Place 1 Application into the nose 2 (two) times daily.   [employer] JOINT HEALTH TRIPLE ACTION PO Take 1 tablet by mouth daily.   docusate sodium 100 MG capsule Commonly known as: COLACE Take 1 capsule (100 mg total) by mouth 2 (two) times daily. What changed: when to take this   Hair/Skin/Nails/Biotin Tabs Take 1 tablet by mouth daily.   levothyroxine 50 MCG tablet Commonly known as: SYNTHROID Take 50 mcg by mouth daily before breakfast.   Magnesium 400 MG Tabs Take 400 mg by mouth 3 (three) times a week.   neomycin-bacitracin-polymyxin Oint Commonly known as: NEOSPORIN Apply 1 Application topically daily as needed for wound care.   nystatin-triamcinolone cream Commonly known as: MYCOLOG II Apply 1 Application topically 2 (two) times daily as needed (rash).   ondansetron 4 MG tablet Commonly known as: ZOFRAN Take 1 tablet (4 mg total) by mouth every 6 (six) hours as needed for nausea.   oxyCODONE 5 MG immediate release tablet Commonly known as: Oxy IR/ROXICODONE Take 1-2 tablets (5-10 mg total) by mouth every 4 (four) hours as needed for severe pain or breakthrough pain.   polyethylene glycol 17 g packet Commonly known as: MIRALAX / GLYCOLAX Take 17 g by mouth at bedtime. For constipation   pyridOXINE 100 MG tablet Commonly known as: VITAMIN B6 Take 100 mg by mouth daily.   RA CALCIUM PLUS MINERALS/VIT D PO Take 1 tablet by mouth daily.   THERATEARS EXTRA OP Place 1 drop into both eyes 4 (four) times daily as needed (dry eyes).   Tums Ultra 1000 400 MG chewable tablet Generic drug: calcium elemental as carbonate Chew 1,000 mg by mouth daily as needed for heartburn.   vitamin k 100 MCG tablet Take 100 mcg by mouth daily.        Follow-up Information     [name], MD  Follow up on 09/29/2022.   Specialty: General Surgery Why: You will see Dr. Lovell Sheehan 6/13 to get staples out Contact information: [address] [place] [facility] [address] [phone]                Future Appointments  Date Time Provider Department Center  09/23/2022 11:15 AM Franky Macho, MD RS-RS None  09/29/2022  2:30 PM [name], MD RS-RS None  12/14/2022  3:15 PM Carlan, Jeral Pinch, NP NRE-NRE None  Signed: [name] 09/15/2022, 10:26 AM

## 2022-09-15 NOTE — Consult Note (Signed)
[facility] Customer service manager [facility]) Accountable Care Organization (ACO) [facility] Liaison Note  09/15/2022  Yvette Branch 03-05-1946 [id number]  Location: [facility] RN Hospital Liaison screened the patient remotely at [facility].  Insurance: MCR ACO   [name] is a 77 y.o. female who is a Primary Care Patient of Assunta Found, MD [facility] [place]. The patient was screened for  readmission hospitalization with noted high risk score for unplanned readmission risk with 2 IP/2 ED in 6 months.  The patient was assessed for potential [facility]) Care Management service needs for post hospital transition for care coordination. Review of patient's electronic medical record reveals patient history of creation of ostomy (reversal).  Pt receptive to ongoing post hospital prevention follow  up call from a [facility] care coordinator. Will make referral for hospital prevention readmissions. Pt set up with HHRN.  Plan: [facility] Liaison will continue to follow progress and disposition to asess for post hospital community care coordination/management needs.  Referral request for community care coordination: anticipate [facility] Transitions of Care Team follow up.   The [facility] Care Management/Population Health does not replace or interfere with any arrangements made by the Inpatient Transition of Care team.   For questions contact:   Elliot Cousin, RN, BSN [facility] Liaison [place]   [facility]  Population Health Office Hours MTWF  8:00 am-6:00 pm Off on Thursday [phone] mobile [phone] [Office toll free line][facility] Office Hours are M-F 8:30 - 5 pm 24 hour nurse advise line [phone] [email or web address]  [email or web address]

## 2022-09-15 NOTE — Anesthesia Postprocedure Evaluation (Signed)
Anesthesia Post Note  Patient: VANDELLA MURNAHAN  Procedure(s) Performed: COLOSTOMY REVERSAL (Abdomen)  Patient location during evaluation: Phase II Anesthesia Type: General Level of consciousness: awake Pain management: pain level controlled Vital Signs Assessment: post-procedure vital signs reviewed and stable Respiratory status: spontaneous breathing and respiratory function stable Cardiovascular status: blood pressure returned to baseline and stable Postop Assessment: no headache and no apparent nausea or vomiting Anesthetic complications: no Comments: Late entry   No notable events documented.   Last Vitals:  Vitals:   09/15/22 0321 09/15/22 1434  BP: 134/63 (!) 119/55  Pulse: (!) 58 65  Resp: 17 16  Temp: 36.6 C 36.9 C  SpO2: 98% 99%    Last Pain:  Vitals:   09/15/22 1434  TempSrc: Oral  PainSc:                  Windell Norfolk

## 2022-09-15 NOTE — TOC Transition Note (Signed)
Transition of Care [facility]) - CM/SW Discharge Note   Patient Details  Name: Yvette Branch MRN: [id number] Date of Birth: 25-Mar-1946  Transition of Care [facility]) CM/SW Contact:  Villa Herb, LCSWA Phone Number: 09/15/2022, 11:22 AM   Clinical Narrative:    CSW noted that D/C orders have been placed for pt. CSW confirmed [facility] RN orders have been placed. CSW updated Kandee Keen with [facility] that [facility] orders have been placed and pt will D/C home today. TOC signing off.   Final next level of care: Home w Home Health Services Barriers to Discharge: Barriers Resolved   Patient Goals and CMS Choice CMS Medicare.gov Compare Post Acute Care list provided to:: Patient Choice offered to / list presented to : Patient  Discharge Placement                         Discharge Plan and Services Additional resources added to the After Visit Summary for   In-house Referral: Clinical Social Work Discharge Planning Services: CM Consult Post Acute Care Choice: Home Health                    HH Arranged: RN [facility] Agency: [facility] Date [facility] Agency Contacted: 09/13/22   Representative spoke with at [facility] Agency: Kandee Keen  Social Determinants of Health (SDOH) Interventions SDOH Screenings   Food Insecurity: No Food Insecurity (09/10/2022)  Housing: Low Risk  (09/10/2022)  Transportation Needs: No Transportation Needs (09/10/2022)  Utilities: Not At Risk (09/10/2022)  Tobacco Use: Low Risk  (09/14/2022)     Readmission Risk Interventions     No data to display

## 2022-09-20 ENCOUNTER — Telehealth: Payer: Self-pay | Admitting: *Deleted

## 2022-09-20 ENCOUNTER — Encounter: Payer: Self-pay | Admitting: *Deleted

## 2022-09-20 NOTE — Progress Notes (Signed)
  Care Coordination   Note   09/20/2022 Name: [name] MRN: [id number] DOB: 1945-04-23  Yvette Branch is a 77 y.o. year old female who sees Assunta Found, MD for primary care. I reached out to [name] by phone today to offer care coordination services.  [name] was given information about Care Coordination services today including:   The Care Coordination services include support from the care team which includes your Nurse Coordinator, Clinical Social Worker, or Pharmacist.  The Care Coordination team is here to help remove barriers to the health concerns and goals most important to you. Care Coordination services are voluntary, and the patient may decline or stop services at any time by request to their care team member.   Care Coordination Consent Status: Patient agreed to services and verbal consent obtained.   Follow up plan:  Telephone appointment with care coordination team member scheduled for:  09/24/22  Encounter Outcome:  Pt. Scheduled  [facility] Coordination Care Guide  Direct Dial: [phone]

## 2022-09-20 NOTE — Progress Notes (Signed)
Error

## 2022-09-23 ENCOUNTER — Ambulatory Visit (INDEPENDENT_AMBULATORY_CARE_PROVIDER_SITE_OTHER): Payer: Medicare Other | Admitting: General Surgery

## 2022-09-23 ENCOUNTER — Encounter: Payer: Self-pay | Admitting: General Surgery

## 2022-09-23 ENCOUNTER — Ambulatory Visit (INDEPENDENT_AMBULATORY_CARE_PROVIDER_SITE_OTHER): Payer: Medicare Other | Admitting: Gastroenterology

## 2022-09-23 VITALS — BP 119/74 | HR 73 | Temp 97.6°F | Resp 14 | Ht 62.0 in | Wt 97.0 lb

## 2022-09-23 DIAGNOSIS — Z09 Encounter for follow-up examination after completed treatment for conditions other than malignant neoplasm: Secondary | ICD-10-CM

## 2022-09-23 NOTE — Progress Notes (Signed)
Subjective:     Yvette Branch  Is here for postoperative visit, status post colostomy takedown.  She states she is doing well.  She does sometimes have to strain to move her bowels.  She is on stool softener and taking MiraLAX.  She denies any blood per rectum.  She denies any nausea, vomiting, fever, or chills. Objective:    BP 119/74   Pulse 73   Temp 97.6 F (36.4 C) (Oral)   Resp 14   Ht 5\' 2"  (1.575 m)   Wt 97 lb (44 kg)   SpO2 96%   BMI 17.74 kg/m   General:  alert, cooperative, and no distress  Abdomen soft, incisions healing well.  Staples removed.     Assessment:    Doing well postoperatively.    Plan:   I told the patient to avoid any constipation.  She may increase her MiraLAX to twice a day as needed for constipation.  She should stay hydrated.  She is to follow-up on 09/29/2022 with Dr. Henreitta Leber.

## 2022-09-24 ENCOUNTER — Encounter (HOSPITAL_COMMUNITY): Payer: Self-pay | Admitting: Gastroenterology

## 2022-09-24 ENCOUNTER — Telehealth: Payer: Self-pay

## 2022-09-24 ENCOUNTER — Ambulatory Visit: Payer: Self-pay | Admitting: *Deleted

## 2022-09-24 DIAGNOSIS — K56609 Unspecified intestinal obstruction, unspecified as to partial versus complete obstruction: Secondary | ICD-10-CM

## 2022-09-24 DIAGNOSIS — Z5982 Transportation insecurity: Secondary | ICD-10-CM

## 2022-09-24 NOTE — Patient Outreach (Signed)
  Care Coordination   Initial Visit Note   09/24/2022 Name: Yvette Branch MRN: [id number] DOB: Mar 14, 1946  Yvette Branch is a 77 y.o. year old female who sees Assunta Found, MD for primary care. I spoke with  [name] by phone today.  What matters to the patients health and wellness today?  Recovering from colostomy reversal and preventing constipation    Goals Addressed             This Visit's Progress    Care Coordination Services       Care Coordination Goals: Patient will work with RN Care Coordinator and team regarding recovery from colostomy reversal Patient will keep follow-up medical appointments Patient will continue to take Miralax and drink plenty of fluids as instructed to decrease risk for constipation Patient will reach out to surgeon with any new or worsening symptoms Patient will follow a low fiber diet as instructed by surgeon Patient will review low fiber diet educational materials mailed by [facility] Patient will talk with Care Guides re: Mom's Meals and transportation assistance Patient will increase activity level as tolerated and rest as needed Patient will call RN Care Manager [phone] with any resource or care coordination needs        SDOH assessments and interventions completed:  Yes  SDOH Interventions Today    Flowsheet Row Most Recent Value  SDOH Interventions   Food Insecurity Interventions AMB Referral  [difficulty with preparing meals since surgery. Would like Mom's Meals if covered by insurance]  Transportation Interventions AMB Referral, Patient Resources (Friends/Family)  Gu[place]upe Sabin and neighbor have been assisting with transportation. May need transportation assistance for appt with surgeon on 09/29/22]  Financial Strain Interventions Intervention Not Indicated  Physical Activity Interventions Patient Declined  [recovering from colon resection]        Care Coordination Interventions:  Yes, provided  Interventions Today    Flowsheet  Row Most Recent Value  Chronic Disease   Chronic disease during today's visit Other  [diverticulosis with recent colostomy reversal]  General Interventions   General Interventions Discussed/Reviewed General Interventions Discussed, General Interventions Reviewed, Doctor Visits, Community Resources  Doctor Visits Discussed/Reviewed Doctor Visits Discussed, Specialist, Doctor Visits Reviewed, PCP  PCP/Specialist Visits Compliance with follow-up visit  [09/29/22 follow-up with surgeon, Dr Henreitta Leber  Exercise Interventions   Exercise Discussed/Reviewed Exercise Discussed, Exercise Reviewed, Physical Activity, Weight Managment  Physical Activity Discussed/Reviewed Physical Activity Discussed, Physical Activity Reviewed  Weight Management Weight maintenance  [boost/ensure. Has lost down to 97 lbs since having colon surgery]  Education Interventions   Education Provided Provided Therapist, sports, Provided Education  Provided Verbal Education On Nutrition, Medication, When to see the doctor, Mental Health/Coping with Illness  [printed materials on low fiber diet as recommended by surgeon]  Mental Health Interventions   Mental Health Discussed/Reviewed Mental Health Discussed, Mental Health Reviewed  Nutrition Interventions   Nutrition Discussed/Reviewed Nutrition Discussed, Nutrition Reviewed, Fluid intake, Supplemental nutrition  [increase fluids, low fiber diet, Boost/ensure]  Pharmacy Interventions   Pharmacy Dicussed/Reviewed Pharmacy Topics Discussed, Pharmacy Topics Reviewed, Medications and their functions  [discussed use of miralax to prevent constipatin and MOA]  Safety Interventions   Safety Discussed/Reviewed Safety Discussed, Safety Reviewed       Follow up plan: Follow up call scheduled for 10/01/22    Encounter Outcome:  Pt. Visit Completed   Demetrios Loll, BSN, RN-BC RN Care Coordinator [facility]  [facility] Direct Dial: [phone] Main #: [phone]

## 2022-09-24 NOTE — Telephone Encounter (Signed)
   Telephone encounter was:  Unsuccessful.  09/24/2022 Name: Yvette Branch MRN: [id number] DOB: July 22, 1945  Unsuccessful outbound call made today to assist with:  Transportation Needs  and Food Insecurity  Outreach Attempt:  1st Attempt  A HIPAA compliant voice message was left requesting a return call.  Instructed patient to call back .   Lenard Forth [facility] Guide, [facility] [phone] [address] [place], [place], [place] [address] Phone: [phone] Email: [email or web address]

## 2022-09-27 ENCOUNTER — Telehealth: Payer: Self-pay

## 2022-09-27 NOTE — Telephone Encounter (Signed)
   Telephone encounter was:  Successful.  09/27/2022 Name: Yvette Branch MRN: [id number] DOB: September 16, 1945  [name] is a 77 y.o. year old female who is a primary care patient of Assunta Found, MD . The community resource team was consulted for assistance with Transportation Needs  and Food Insecurity  Care guide performed the following interventions: Patient provided with information about care guide support team and interviewed to confirm resource needs.PT asked for transportation resources that I provided over the phone I will put in a referral for [facility].   Follow Up Plan:  No further follow up planned at this time. The patient has been provided with needed resources.   Lenard Forth [facility] Guide, [facility] [phone] [address] [place], [place], [place] [address] Phone: [phone] Email: [email or web address]

## 2022-09-29 ENCOUNTER — Ambulatory Visit (INDEPENDENT_AMBULATORY_CARE_PROVIDER_SITE_OTHER): Payer: Medicare Other | Admitting: General Surgery

## 2022-09-29 ENCOUNTER — Encounter: Payer: Self-pay | Admitting: General Surgery

## 2022-09-29 VITALS — BP 128/72 | HR 67 | Temp 98.1°F | Resp 14 | Ht 62.0 in | Wt 98.0 lb

## 2022-09-29 DIAGNOSIS — Z933 Colostomy status: Secondary | ICD-10-CM

## 2022-09-29 NOTE — Patient Instructions (Signed)
No heavy lifting > 10 lbs, excessive bending, pushing, pulling, or squatting for 6-8 weeks after surgery.  Keep stools regular and soft. Can try more fiber in the food or fiber supplement  Can get back to a more regular diet.

## 2022-09-29 NOTE — Progress Notes (Signed)
[facility]  Sore but doing well. Only takes tylenol for pain as needed. Eating but eating mostly boost and bananas. Has Bm every day but can be hard at times.   BP 128/72   Pulse 67   Temp 98.1 F (36.7 C) (Oral)   Resp 14   Ht 5\' 2"  (1.575 m)   Wt 98 lb (44.5 kg)   SpO2 99%   BMI 17.92 kg/m  Wound healing, no erythema or drainage, staples out    Patient s/p colostomy reversal doing well.   No heavy lifting > 10 lbs, excessive bending, pushing, pulling, or squatting for 6-8 weeks after surgery.  Keep stools regular and soft. Can try more fiber in the food or fiber supplement  Can get back to a more regular diet.   Future Appointments  Date Time Provider Department Center  10/01/2022  3:00 PM Gwenith Daily, RN THN-CCC None  11/04/2022  2:15 PM [name], MD RS-RS None  12/14/2022  3:15 PM Raquel James, NP NRE-NRE None   Algis Greenhouse, MD [facility] [address] [place], [place] [address] [phone] (office)

## 2022-10-01 ENCOUNTER — Encounter: Payer: Self-pay | Admitting: *Deleted

## 2022-10-01 ENCOUNTER — Ambulatory Visit: Payer: Self-pay | Admitting: *Deleted

## 2022-10-01 NOTE — Patient Outreach (Signed)
  Care Coordination   Follow Up Visit Note   10/01/2022 Name: Yvette Branch MRN: [id number] DOB: February 15, 1946  Yvette Branch is a 77 y.o. year old female who sees Yvette Found, MD for primary care. I spoke with  Yvette Branch by phone today.  What matters to the patients health and wellness today?  Managing diet and bowel movements since having colostomy reversal.    Goals Addressed             This Visit's Progress    Care Coordination Services       Care Coordination Goals: Patient will work with RN Care Coordinator and team regarding recovery from colostomy reversal Patient will keep follow-up medical appointments Patient will continue to take Miralax and drink plenty of fluids as instructed to decrease risk for constipation Patient will add fiber back into diet as instructed by Dr Henreitta Leber Patient will reach out to surgeon with any new or worsening symptoms Patient will review printed educational materials on bulk forming fiber supplements and foods Patient will increase activity level as tolerated and rest as needed Patient will call RN Care Manager [phone] with any resource or care coordination needs        SDOH assessments and interventions completed:  Yes  SDOH Interventions Today    Flowsheet Row Most Recent Value  SDOH Interventions   Transportation Interventions Patient Resources (Friends/Family), AMB Referral  Financial Strain Interventions Intervention Not Indicated        Care Coordination Interventions:  Yes, provided  Interventions Today    Flowsheet Row Most Recent Value  Chronic Disease   Chronic disease during today's visit Other  [diverticulosis with recent colostomy reversal]  General Interventions   General Interventions Discussed/Reviewed General Interventions Discussed, General Interventions Reviewed, Doctor Visits  Doctor Visits Discussed/Reviewed Doctor Visits Discussed, Doctor Visits Reviewed, PCP, Specialist  [reviewed recent visit with  surgeon, Dr Henreitta Leber, and follow-up appt in for July]  PCP/Specialist Visits Compliance with follow-up visit  Exercise Interventions   Exercise Discussed/Reviewed Physical Activity, Weight Managment  Physical Activity Discussed/Reviewed Physical Activity Discussed, Physical Activity Reviewed  Weight Management Weight maintenance  Education Interventions   Education Provided Provided Education, Provided Printed Education  Provided Verbal Education On Nutrition, When to see the doctor, Medication, Mental Health/Coping with Illness, Other  [Main complaint is incomplete evaucation of small soft stool.printed materials on bulk forming fiber/foods. Lengthy discuussion re: foods, bowel movements, adding bulk to stool to help stools pass easier but preventing constipation.]  Nutrition Interventions   Nutrition Discussed/Reviewed Nutrition Discussed, Nutrition Reviewed, Fluid intake, Supplemental nutrition, Adding fruits and vegetables  [can add more fiber and begin to resume normal diet per last visit with Dr Henreitta Leber. Recommended a dose of Benefiber b/c it can help with bulk forming. Continue Boost]  Pharmacy Interventions   Pharmacy Dicussed/Reviewed Pharmacy Topics Discussed, Pharmacy Topics Reviewed, Medications and their functions  [patient has been taking miralax and a stool softener]  Safety Interventions   Safety Discussed/Reviewed Safety Discussed, Safety Reviewed       Follow up plan: Follow up call scheduled for 10/06/22    Encounter Outcome:  Pt. Visit Completed   Yvette Branch, BSN, RN-BC RN Care Coordinator [facility]  [facility] Direct Dial: [phone] Main #: [phone]

## 2022-10-06 ENCOUNTER — Ambulatory Visit: Payer: Self-pay | Admitting: *Deleted

## 2022-10-07 ENCOUNTER — Encounter: Payer: Self-pay | Admitting: *Deleted

## 2022-10-07 NOTE — Patient Outreach (Signed)
  Care Coordination   Follow Up Visit Note   10/06/2022 Name: Yvette Branch MRN: [id number] DOB: 08-Mar-1946  Yvette Branch is a 77 y.o. year old female who sees Assunta Found, MD for primary care. I spoke with  Yvette Branch by phone today.  What matters to the patients health and wellness today?  Bulking up stool for easier elimination while still preventing constiption    Goals Addressed             This Visit's Progress    Care Coordination Services       Care Coordination Goals: Patient will work with RN Care Coordinator and team regarding recovery from colostomy reversal Patient will keep follow-up medical appointments Dr Henreitta Leber in July Patient will continue to take Miralax and drink plenty of fluids as instructed to decrease risk for constipation Patient will add fiber back into diet as instructed by Dr Henreitta Leber Patient will reach out to surgeon with any new or worsening symptoms Patient will review printed educational materials on bulk forming fiber supplements and foods Patient will review printed educational materials on proper positioning for bowel elimination  Patient will increase activity level as tolerated and rest as needed Patient will call RN Care Manager [phone] with any resource or care coordination needs        SDOH assessments and interventions completed:  No     Care Coordination Interventions:  Yes, provided  Interventions Today    Flowsheet Row Most Recent Value  Chronic Disease   Chronic disease during today's visit Other  [Diverticulosis with recent colostomy reversal]  General Interventions   General Interventions Discussed/Reviewed General Interventions Discussed, General Interventions Reviewed, Doctor Visits  Doctor Visits Discussed/Reviewed Doctor Visits Discussed, Doctor Visits Reviewed, PCP, Specialist  [follow up with Dr Henreitta Leber in July]  PCP/Specialist Visits Compliance with follow-up visit  Exercise Interventions   Exercise  Discussed/Reviewed Physical Activity  Physical Activity Discussed/Reviewed Physical Activity Discussed, Physical Activity Reviewed  Weight Management Weight maintenance  Education Interventions   Education Provided Provided Printed Education, Provided Education  Provided Verbal Education On Nutrition, When to see the doctor, Mental Health/Coping with Illness, Medication, Other, Exercise  [proper positioning for toileting to allow for easier bowel movements]  Nutrition Interventions   Nutrition Discussed/Reviewed Nutrition Discussed, Nutrition Reviewed, Fluid intake, Adding fruits and vegetables, Supplemental nutrition  [increasing fiber intake to a more normal diet per surgeons instructions. Need to bulk up stool to aid in evacuation but still avoid constipation. Previously provided printed info and verbal info on fiber and stool consistency]  Pharmacy Interventions   Pharmacy Dicussed/Reviewed Pharmacy Topics Discussed, Pharmacy Topics Reviewed, Medications and their functions  [May need to decreaes to one stool stoftener a day with the miralax since she is still have small loose stools. Need to focus on bulking up stool while still avoiding constipation]  Safety Interventions   Safety Discussed/Reviewed Safety Discussed, Safety Reviewed       Follow up plan: Follow up call scheduled for 10/11/22    Encounter Outcome:  Pt. Visit Completed   Yvette Branch, BSN, RN-BC RN Care Coordinator [facility]  [facility] Direct Dial: [phone] Main #: [phone]

## 2022-10-11 ENCOUNTER — Ambulatory Visit: Payer: Self-pay | Admitting: *Deleted

## 2022-10-11 ENCOUNTER — Encounter: Payer: Self-pay | Admitting: *Deleted

## 2022-10-11 NOTE — Patient Outreach (Signed)
  Care Coordination   Follow Up Visit Note   10/11/2022 Name: Yvette Branch MRN: [id number] DOB: 12-21-1945  Yvette Branch is a 77 y.o. year old female who sees Yvette Found, MD for primary care. I spoke with  Yvette Branch by phone today.  What matters to the patients health and wellness today?  Improving stool consistency for better bowel elimination     Goals Addressed             This Visit's Progress    Care Coordination Services   On track    Care Coordination Goals: Patient will work with RN Care Coordinator and team regarding recovery from colostomy reversal Patient will keep follow-up medical appointments Dr Henreitta Leber on 11/04/22 Patient will continue to take Miralax and drink plenty of fluids as instructed to decrease risk for constipation Patient will continue to add fiber back into diet as instructed by Dr Henreitta Leber Using [employer] as a fiber supplement Patient will reach out to surgeon with any new or worsening symptoms Patient refer to printed educational materials on bulk forming fiber supplements and foods Patient will refer to printed educational materials on proper positioning for bowel elimination  Patient will increase activity level as tolerated and rest as needed Patient will call RN Care Manager [phone] with any resource or care coordination needs        SDOH assessments and interventions completed:  No     Care Coordination Interventions:  Yes, provided  Interventions Today    Flowsheet Row Most Recent Value  Chronic Disease   Chronic disease during today's visit Other  [diverticulosis with recent colostomy reversal]  General Interventions   General Interventions Discussed/Reviewed General Interventions Discussed, General Interventions Reviewed, Doctor Visits  [Patient reports significant improvement in stool consistency and bowel elimination since increasing fiber and changing toileting positioning. Abd pain has also decreased.]  Doctor Visits  Discussed/Reviewed Doctor Visits Discussed, Doctor Visits Reviewed, PCP, Specialist  [follow-up with surgeon, Dr Henreitta Leber on 11/04/22]  PCP/Specialist Visits Compliance with follow-up visit  Exercise Interventions   Exercise Discussed/Reviewed Physical Activity  Physical Activity Discussed/Reviewed Physical Activity Discussed, Physical Activity Reviewed  Education Interventions   Provided Verbal Education On Nutrition, Medication, When to see the doctor, Mental Health/Coping with Illness  Nutrition Interventions   Nutrition Discussed/Reviewed Nutrition Discussed, Nutrition Reviewed, Supplemental nutrition, Adding fruits and vegetables, Fluid intake  Pharmacy Interventions   Pharmacy Dicussed/Reviewed Pharmacy Topics Discussed, Pharmacy Topics Reviewed, Medications and their functions  Safety Interventions   Safety Discussed/Reviewed Safety Discussed, Safety Reviewed       Follow up plan: Follow up call scheduled for 10/28/22    Encounter Outcome:  Pt. Visit Completed   Demetrios Loll, BSN, RN-BC RN Care Coordinator [facility]  [facility] Direct Dial: [phone] Main #: [phone]

## 2022-10-28 ENCOUNTER — Ambulatory Visit: Payer: Self-pay | Admitting: *Deleted

## 2022-10-28 ENCOUNTER — Encounter: Payer: Self-pay | Admitting: *Deleted

## 2022-10-28 NOTE — Patient Outreach (Signed)
Care Coordination   Follow Up Visit Note   10/28/2022 Name: Yvette Branch MRN: [id number] DOB: 11-20-1945  Yvette Branch is a 77 y.o. year old female who sees Yvette Found, MD for primary care. I spoke with  Yvette Branch by phone today.  What matters to the patients health and wellness today?  Continuous improvement of bowel function and increasing weight    Goals Addressed             This Visit's Progress    Improvement in Bowel Function       Care Coordination Goals: Patient will work with RN Care Coordinator and team regarding recovery from colostomy reversal Patient will keep follow-up medical appointments Yvette Branch on 11/04/22 Patient will continue to take Miralax and drink plenty of fluids as instructed to decrease risk for constipation Patient will continue to add fiber back into diet as instructed by Yvette Branch Patient will eat a balanced diet of protein, complex carbs, and healthy fats to improve weight, improve bowel elimination, and to promote healing Patient will reach out to surgeon with any new or worsening symptoms Patient refer to printed educational materials on bulk forming fiber supplements and foods Patient will refer to printed educational materials on proper positioning for bowel elimination  Patient will purchase a stool or Squatty Potty to allow for better positioning and easier bowel elimination Patient will increase activity level as tolerated and rest as needed Patient will purchase an OTC glucometer and test strips and check blood sugar fasting and 2 hours after eating a few times a week for peace of mind since she is concerned about the sugar content in Boost high protein, which is what was recommended by surgeon Patient will talk with surgeon about other nutritional supplement options and see if one lower in sugar is appropriate at this time Patient will reach out to RN Care Coordinator at [phone] with any resource or care coordination  needs Patient will call RN Care Manager [phone] with any resource or care coordination needs        SDOH assessments and interventions completed:  Yes  SDOH Interventions Today    Flowsheet Row Most Recent Value  SDOH Interventions   Food Insecurity Interventions Intervention Not Indicated, Other (Comment)  [Resources have been provided]  Transportation Interventions Patient Resources (Friends/Family)        Care Coordination Interventions:  Yes, provided  Interventions Today    Flowsheet Row Most Recent Value  Chronic Disease   Chronic disease during today's visit Other  [diverticulosis with recent colostomy reversal, hypothyroidism]  General Interventions   General Interventions Discussed/Reviewed General Interventions Discussed, General Interventions Reviewed, Labs, Doctor Visits, Durable Medical Equipment (DME)  [Per patient she feel like she is improving daily. Her bowel movments are becoming a more normal consistency and she isn't haven't to strain to eliminate bowels.]  Labs --  [thyroid levels]  Doctor Visits Discussed/Reviewed Doctor Visits Discussed, Doctor Visits Reviewed, PCP, Specialist  [Saw PCP, Yvette Branch, on 10/26/22. Reviewed office notes and lab results.]  Durable Medical Equipment (DME) Other  [Squatty Potty to help elevate legs into a more natural position for bowel elimination. Previously recommended elevating knees and that has helped so she would like something specifically designed to help maintain that position.]  PCP/Specialist Visits Compliance with follow-up visit  [Surgeon, Yvette Branch, on 11/04/22]  Exercise Interventions   Exercise Discussed/Reviewed Physical Activity, Weight Managment  Physical Activity Discussed/Reviewed Physical Activity Discussed, Physical Activity Reviewed  [Able to  perform ADLs]  Weight Management Weight maintenance  [Discussed Boost original, glucose control, and high protein. Surgeon advised to drink high protein boost.  Patient concerned about sugar content of Boost. Encouraged to discuss with surgeon at upcoming visit but continue high protein for now.]  Education Interventions   Education Provided Provided Education  Provided Verbal Education On Nutrition, When to see the doctor, Mental Health/Coping with Illness, Blood Sugar Monitoring, Labs, Medication, Other  [Use of glucometer. A1C is 5.7. No DM dx but pt is concerned about blood sugar due to glucose in Boost. She can buy the Relion meter at [employer] for $9 if she wants to spot check her sugar fasting and 2 hrs postprandial a few times a week,for peace of mind]  Labs Reviewed Hgb A1c  [09/24/22 TSH 2.48  09/08/22 A1C 5.7]  Mental Health Interventions   Mental Health Discussed/Reviewed Anxiety, Mental Health Discussed, Mental Health Reviewed  [health related anxiety but this is improving since bowel symptoms have improved]  Nutrition Interventions   Nutrition Discussed/Reviewed Nutrition Discussed, Nutrition Reviewed, Increasing proteins, Decreasing sugar intake, Supplemental nutrition, Adding fruits and vegetables, Fluid intake  [Avoid hard nuts and seeds. Soft seeds like that are in cooked green beans are ok to eat. Chew thoroughly. Recommend switching back to whole milk from 1% since she is trying to gain weight. Complex carbs, protein, and healthy fats]  Pharmacy Interventions   Pharmacy Dicussed/Reviewed Pharmacy Topics Discussed, Pharmacy Topics Reviewed, Medications and their functions  [thyroid med was refilled at PCP visit earlier this week. TSH was WNL. Discussed value with patient b/c she wasn't aware of the results]  Safety Interventions   Safety Discussed/Reviewed Safety Discussed, Safety Reviewed      Follow up plan: Follow up call scheduled for 11/10/22    Encounter Outcome:  Pt. Visit Completed   Yvette Branch, BSN, RN-BC RN Care Coordinator [facility]  [facility] Direct Dial: [phone] Main #: [phone]

## 2022-11-04 ENCOUNTER — Ambulatory Visit (INDEPENDENT_AMBULATORY_CARE_PROVIDER_SITE_OTHER): Payer: Medicare Other | Admitting: General Surgery

## 2022-11-04 ENCOUNTER — Encounter: Payer: Self-pay | Admitting: General Surgery

## 2022-11-04 VITALS — BP 122/68 | HR 48 | Temp 98.0°F | Resp 14 | Ht 62.0 in | Wt 100.0 lb

## 2022-11-04 DIAGNOSIS — Z933 Colostomy status: Secondary | ICD-10-CM

## 2022-11-04 NOTE — Patient Instructions (Signed)
Diet and activity as tolerated.

## 2022-11-04 NOTE — Progress Notes (Signed)
[facility]  Doing well. Eating and having Bms. Trying to gain weight but at least not losing.   BP 122/68   Pulse (!) 48   Temp 98 F (36.7 C) (Oral)   Resp 14   Ht 5\' 2"  (1.575 m)   Wt 100 lb (45.4 kg)   SpO2 95%   BMI 18.29 kg/m  Incisions healed, no hernia noted, minor induration  Patient s/p colostomy reversal. Doing well.  Diet and activity as tolerated. PRN Follow up  Algis Greenhouse, MD [facility] [address]ella R[address] [place], [place] [address] [phone] (office)

## 2022-11-10 ENCOUNTER — Encounter: Payer: Self-pay | Admitting: *Deleted

## 2022-11-10 ENCOUNTER — Ambulatory Visit: Payer: Self-pay | Admitting: *Deleted

## 2022-11-10 NOTE — Patient Outreach (Signed)
Care Coordination   Follow Up Visit Note   11/10/2022 Name: SADA VARDA MRN: [id number] DOB: Mar 27, 1946  AOLANIS LINSLEY is a 77 y.o. year old female who sees Assunta Found, MD for primary care. I spoke with  [name] by phone today.  What matters to the patients health and wellness today?  Continuing to improve after colostomy reversal    Goals Addressed             This Visit's Progress    Improvement in Bowel Function   On track    Care Coordination Goals: Patient will work with RN Care Coordinator and team regarding recovery from colostomy reversal Patient will keep follow-up medical appointments Patient will continue to take Miralax and drink plenty of fluids as instructed to decrease risk for constipation Patient will continue to add fiber back into diet as instructed by Dr Henreitta Leber Patient will eat a balanced diet of protein, complex carbs, and healthy fats to improve weight, improve bowel elimination, and to promote healing Boost daily as nutritional supplement to help with increasing weight Patient will reach out to surgeon with any new or worsening symptoms Patient will use a squatty potty to aid in proper positioning for bowel elimination  Patient will increase activity level as tolerated and rest as needed Patient will reach out to RN Care Coordinator at [phone] with any resource or care coordination needs Patient will call RN Care Manager [phone] with any resource or care coordination needs        SDOH assessments and interventions completed:  Yes  SDOH Interventions Today    Flowsheet Row Most Recent Value  SDOH Interventions   Food Insecurity Interventions Intervention Not Indicated  [interventions have been provided]  Transportation Interventions Patient Resources (Friends/Family)  Physical Activity Interventions Intervention Not Indicated        Care Coordination Interventions:  Yes, provided  Interventions Today    Flowsheet Row Most  Recent Value  Chronic Disease   Chronic disease during today's visit Other  [diverticulosis with recent colostomy reversal]  General Interventions   General Interventions Discussed/Reviewed General Interventions Discussed, General Interventions Reviewed, Doctor Visits  Doctor Visits Discussed/Reviewed Doctor Visits Discussed, Specialist, Doctor Visits Reviewed, PCP  Annabell Sabal and discussed visit with surgeon on 11/04/22]  Durable Medical Equipment (DME) Other  [Squatty Potty to help elevate legs into a more natural position for bowel elimination.]  PCP/Specialist Visits Compliance with follow-up visit  Exercise Interventions   Exercise Discussed/Reviewed Exercise Discussed, Weight Managment, Exercise Reviewed, Physical Activity  Physical Activity Discussed/Reviewed Physical Activity Discussed, Physical Activity Reviewed, Types of exercise  [was walking 10,000 steps per day prior to surgery. Has worked back up to 5,000-6,000 steps per day. Mixture of walking inside and outside. Avoids walking during the heat of the day]  Weight Management Weight maintenance  [Trying to increase weight or at least maintain current weight. Drinking 1-2 Boost along with regular meals.]  Education Interventions   Education Provided Provided Education  Provided Verbal Education On Nutrition, When to see the doctor, Exercise, Other  [increasing fiber and return to a more normal diet per surgeon. can increase acitivity level and cleared to lift up to 10 lbs. Be cautious because of risk for incisional hernia. Proper positioning for bowel elimination.]  Nutrition Interventions   Nutrition Discussed/Reviewed Nutrition Discussed, Nutrition Reviewed, Increasing proteins, Adding fruits and vegetables, Supplemental nutrition  Safety Interventions   Safety Discussed/Reviewed Safety Discussed, Safety Reviewed       Follow up plan:  Follow up call scheduled for 12/10/22    Encounter Outcome:  Pt. Visit Completed   Demetrios Loll, BSN, RN-BC RN Care Coordinator [facility]  [facility] Direct Dial: [phone] Main #: [phone]

## 2022-11-24 ENCOUNTER — Encounter (INDEPENDENT_AMBULATORY_CARE_PROVIDER_SITE_OTHER): Payer: Self-pay | Admitting: Gastroenterology

## 2022-12-10 ENCOUNTER — Encounter: Payer: Medicare Other | Admitting: *Deleted

## 2022-12-10 ENCOUNTER — Encounter: Payer: Self-pay | Admitting: *Deleted

## 2022-12-10 ENCOUNTER — Ambulatory Visit: Payer: Self-pay | Admitting: *Deleted

## 2022-12-10 NOTE — Patient Outreach (Signed)
  Care Coordination   Follow Up Visit Note   12/10/2022 Name: Yvette Branch MRN: [id number] DOB: 04/24/1945  Yvette Branch is a 77 y.o. year old female who sees Yvette Found, MD for primary care. I spoke with  Yvette Branch by phone today.  What matters to the patients health and wellness today?  Increasing weight and getting back to normal routine    Goals Addressed             This Visit's Progress    Improvement in Bowel Function   On track    Care Coordination Goals: Patient will continue to take Miralax and drink plenty of fluids as instructed to decrease risk for constipation Patient will continue to add fiber back into diet as instructed by Dr Yvette Branch Patient will eat a balanced diet of protein, complex carbs, and healthy fats to improve weight, improve bowel elimination, and to promote healing Boost daily as nutritional supplement to help with increasing weight Patient will reach out to surgeon with any new or worsening symptoms Patient will continue to use a squatty potty to aid in proper positioning for bowel elimination  Patient will call RN Care Manager [phone] with any resource or care coordination needs        SDOH assessments and interventions completed:  Yes  SDOH Interventions Today    Flowsheet Row Most Recent Value  SDOH Interventions   Transportation Interventions Patient Resources (Friends/Family)  Physical Activity Interventions Intervention Not Indicated        Care Coordination Interventions:  Yes, provided  Interventions Today    Flowsheet Row Most Recent Value  Chronic Disease   Chronic disease during today's visit Other  [diverticulosis with recent colostomy reversal]  General Interventions   General Interventions Discussed/Reviewed General Interventions Discussed, General Interventions Reviewed, Doctor Visits, Durable Medical Equipment (DME)  Doctor Visits Discussed/Reviewed Doctor Visits Discussed, Doctor Visits Reviewed, Specialist,  PCP  [Encouraged routine follow-up with PCP and follow-up with surgeon if needed]  Durable Medical Equipment (DME) Other  [Scales. Weight is still around 100 lbs.]  Exercise Interventions   Exercise Discussed/Reviewed Exercise Discussed, Physical Activity, Exercise Reviewed  Physical Activity Discussed/Reviewed Physical Activity Discussed, Physical Activity Reviewed, Types of exercise  [Still walking daily and is working back up to her previous amount of 10,000 steps per day.]  Education Interventions   Education Provided Provided Education  Provided Verbal Education On Nutrition, Medication, When to see the doctor, Mental Health/Coping with Illness  Mental Health Interventions   Mental Health Discussed/Reviewed Mental Health Discussed, Mental Health Reviewed  [Reports feeling better. She is still a little anxious to go out far from home because she may need to have a bowel movement. Less anxious than she was overall though.]  Nutrition Interventions   Nutrition Discussed/Reviewed Nutrition Discussed, Nutrition Reviewed, Supplemental nutrition, Adding fruits and vegetables, Fluid intake, Increasing proteins  [continue boost along with 3 balanced meals per day to increase weight and promote health]  Pharmacy Interventions   Pharmacy Dicussed/Reviewed Pharmacy Topics Discussed, Pharmacy Topics Reviewed, Medications and their functions  [She continues to take miralax daily to prevent constipation]       Follow up plan: Follow up call scheduled for 01/19/23    Encounter Outcome:  Pt. Visit Completed   Yvette Loll, RN, BSN RN Care Coordinator [facility]  [facility] Direct Dial: [phone] Main #: [phone]

## 2022-12-14 ENCOUNTER — Ambulatory Visit (INDEPENDENT_AMBULATORY_CARE_PROVIDER_SITE_OTHER): Payer: Medicare Other | Admitting: Gastroenterology

## 2022-12-15 ENCOUNTER — Other Ambulatory Visit (HOSPITAL_COMMUNITY): Payer: Self-pay | Admitting: Family Medicine

## 2022-12-15 DIAGNOSIS — Z1231 Encounter for screening mammogram for malignant neoplasm of breast: Secondary | ICD-10-CM

## 2023-01-10 ENCOUNTER — Ambulatory Visit (HOSPITAL_COMMUNITY)
Admission: RE | Admit: 2023-01-10 | Discharge: 2023-01-10 | Disposition: A | Discharge status: Home / Self Care | Payer: Medicare Other | Source: Ambulatory Visit | Attending: Family Medicine | Admitting: Family Medicine

## 2023-01-10 ENCOUNTER — Encounter (HOSPITAL_COMMUNITY): Payer: Self-pay

## 2023-01-10 DIAGNOSIS — Z1231 Encounter for screening mammogram for malignant neoplasm of breast: Secondary | ICD-10-CM | POA: Diagnosis present

## 2023-01-13 ENCOUNTER — Ambulatory Visit (INDEPENDENT_AMBULATORY_CARE_PROVIDER_SITE_OTHER): Payer: Medicare Other | Admitting: Gastroenterology

## 2023-01-19 ENCOUNTER — Telehealth: Payer: Self-pay | Admitting: *Deleted

## 2023-01-19 ENCOUNTER — Encounter: Payer: Self-pay | Admitting: *Deleted

## 2023-01-19 NOTE — Progress Notes (Signed)
  Care Coordination Note  01/19/2023 Name: [name] MRN: [id number] DOB: 11/24/1945  Yvette Branch is a 77 y.o. year old female who is a primary care patient of Assunta Found, MD and is actively engaged with the care management team. I reached out to [name] by phone today to assist with re-scheduling a follow up visit with the RN Case Manager  Follow up plan: Unsuccessful telephone outreach attempt made.   [facility]  Care Coordination Care Guide  Direct Dial: [phone]

## 2023-02-01 ENCOUNTER — Encounter (INDEPENDENT_AMBULATORY_CARE_PROVIDER_SITE_OTHER): Payer: Self-pay | Admitting: Gastroenterology

## 2023-02-01 ENCOUNTER — Ambulatory Visit (INDEPENDENT_AMBULATORY_CARE_PROVIDER_SITE_OTHER): Payer: Medicare Other | Admitting: Gastroenterology

## 2023-02-01 VITALS — BP 103/62 | HR 56 | Temp 97.4°F | Ht 62.0 in | Wt 101.2 lb

## 2023-02-01 DIAGNOSIS — R152 Fecal urgency: Secondary | ICD-10-CM

## 2023-02-01 DIAGNOSIS — Z8719 Personal history of other diseases of the digestive system: Secondary | ICD-10-CM

## 2023-02-01 DIAGNOSIS — Z9889 Other specified postprocedural states: Secondary | ICD-10-CM | POA: Insufficient documentation

## 2023-02-01 DIAGNOSIS — R1031 Right lower quadrant pain: Secondary | ICD-10-CM | POA: Diagnosis not present

## 2023-02-01 NOTE — Progress Notes (Signed)
Referring Provider: Assunta Found, MD Primary Care Physician:  Assunta Found, MD Primary GI Physician: Dr. Levon Hedger   Chief Complaint  Patient presents with   Follow-up    Follow up after colonoscopy and colostomy reversal. States she is doing well and not having any issues currently.    HPI:   Yvette Branch is a 77 y.o. female with past medical history of anxiety, arthritis, bowel obstruction, GERD, malnutrition, osteoporosis, vaginal and rectal prolapse.   Patient presenting today for follow up  Last seen march 2024, at that time patient had undergone patient partial colectomy and colostomy on 05/09/2022 after she developed colon perforation likely secondary to diffuse inflammatory colitis.  At last visit she was doing well with her ostomy, some occasional right abdominal pain.  No blood or melena.  Empty ostomy bag twice daily.  Using MiraLAX as needed based on output.  Appetite good.  Patient recommended to continue with MiraLAX and stool softener, low gas/low fiber foods (low FODMAP), schedule colonoscopy prior to colostomy Reversal reversal  Colostomy takedown in may 2024  Present:  She notes more pain after her colostomy reversal in may. Doing well since then. She is doing miralax 1 capful daily and 2 stool softeners, she is having a BM usually daily, she was told to keep stools very soft consistency after her surgey. She feels that this regimen keeps her bowels moving without issues. Has some very occasional RLQ abdominal discomfort here and there, usually occurring more with movement when she does have it. No rectal bleeding or melena. She lost some weight after her surgery and is trying to gain some weight back. She has some fecal urgency sometimes soon after eating, noting this since her colostomy takedown. She is trying to do a lot of chicken and watch her diet. She is unsure what things she should be eating.    Last Colonoscopy:-06/2022 The examined portion of the ileum was  normal.                           - One 3 mm polyp in the ascending colon, removed                            with a cold snare. Resected and retrieved.                           - Diverticulosis in the descending colon.                           - Non-bleeding internal hemorrhoids. A. COLON, ASCENDING, POLYPECTOMY:  - Tubular adenoma  - Negative for high-grade dysplasia or malignancy    Past Medical History:  Diagnosis Date   Abdominal abscess    Anxiety    Arthritis    Bowel obstruction (HCC)    Multiple   Complication of anesthesia    trouble waking up with colonoscopy2007   Degenerative arthritis of lumbar spine    Depression    GERD (gastroesophageal reflux disease)    Hot flashes, menopausal    Hypothyroidism    Intra-abdominal abscess post-procedure    Malnutrition (HCC)    Osteoporosis    Rectal prolapse    Vaginal prolapse     Past Surgical History:  Procedure Laterality Date   ABDOMINAL HYSTERECTOMY     ABDOMINAL SURGERY  bowel obstruction     COLECTOMY WITH COLOSTOMY CREATION/HARTMANN PROCEDURE N/A 05/09/2022   Procedure: COLECTOMY WITH COLOSTOMY CREATION/HARTMANN PROCEDURE;  Surgeon: [name], MD;  Location: [facility];  Service: General;  Laterality: N/A;   COLONOSCOPY N/A 08/02/2014   Procedure: COLONOSCOPY;  Surgeon: Malissa Hippo, MD;  [facility];  Service: Endoscopy;  Laterality: N/A;  1240   COLONOSCOPY WITH PROPOFOL N/A 07/08/2022   Procedure: COLONOSCOPY WITH PROPOFOL;  Surgeon: Dolores Frame, MD;  [facility];  Service: Gastroenterology;  Laterality: N/A;  1230pm, asa 3, via ostomy/rectal stump   COLOSTOMY REVERSAL N/A 09/10/2022   Procedure: COLOSTOMY REVERSAL;  Surgeon: [name], MD;  Location: [facility];  Service: General;  Laterality: N/A;   POLYPECTOMY  07/08/2022   Procedure: POLYPECTOMY;  Surgeon: Marguerita Merles, Reuel Boom, MD;  [facility];  Service: Gastroenterology;;    TONSILLECTOMY      Current Outpatient Medications  Medication Sig Dispense Refill   ALPRAZolam (XANAX) 0.25 MG tablet Take 0.25 mg by mouth 2 (two) times daily as needed for anxiety.     aspirin EC 81 MG tablet Take 81 mg by mouth daily.     Calcium Carbonate-Vit D-Min (RA CALCIUM PLUS MINERALS/VIT D PO) Take 1 tablet by mouth daily.     calcium elemental as carbonate (TUMS ULTRA 1000) 400 MG chewable tablet Chew 1,000 mg by mouth daily as needed for heartburn.     Carboxymethylcellulose Sodium (THERATEARS EXTRA OP) Place 1 drop into both eyes 4 (four) times daily as needed (dry eyes).     docusate sodium (COLACE) 100 MG capsule Take 1 capsule (100 mg total) by mouth 2 (two) times daily. (Patient taking differently: Take 100 mg by mouth daily.) 10 capsule 0   levothyroxine (SYNTHROID, LEVOTHROID) 50 MCG tablet Take 50 mcg by mouth daily before breakfast.   4   Magnesium 400 MG TABS Take 400 mg by mouth 3 (three) times a week.     Multiple Vitamins-Minerals (HAIR/SKIN/NAILS/BIOTIN) TABS Take 1 tablet by mouth daily.     Multiple Vitamins-Minerals (PRESERVISION AREDS PO) Take by mouth. 2 bid     neomycin-bacitracin-polymyxin (NEOSPORIN) OINT Apply 1 Application topically daily as needed for wound care.     nystatin-triamcinolone (MYCOLOG II) cream Apply 1 Application topically 2 (two) times daily as needed (rash).     OVER THE COUNTER MEDICATION Vitamin for joint     polyethylene glycol (MIRALAX / GLYCOLAX) 17 g packet Take 17 g by mouth at bedtime. For constipation 30 each 11   Probiotic Product (ALIGN) 4 MG CAPS Take 1 capsule by mouth daily. 30 capsule 0   pyridOXINE (VITAMIN B-6) 100 MG tablet Take 100 mg by mouth daily.     vitamin k 100 MCG tablet Take 100 mcg by mouth daily.     No current facility-administered medications for this visit.    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   Drug use: No   Sexual activity: Yes    Birth control/protection: Surgical  Other Topics Concern   Not on file  Social History Narrative   Single.  Unmarried, no children.  Lives alone. Independent of ADLs, ambulation.   Social Determinants of Health   Financial Resource Strain: Low Risk  (10/01/2022)   Overall Financial Resource Strain (CARDIA)    Difficulty of Paying Living Expenses: Not very hard  Food Insecurity: Food Insecurity Present (11/10/2022)   Hunger Vital Sign    Worried About Running Out of Food in the Last Year: Sometimes true    Ran Out of Food in the Last Year: Sometimes true  Transportation Needs: No Transportation Needs (12/10/2022)   PRAPARE - Administrator, Civil Service (Medical): No    Lack of Transportation (Non-Medical): No  Recent Concern: Transportation Needs - Unmet Transportation Needs (11/10/2022)   PRAPARE - Transportation    Lack of Transportation (Medical): Yes    Lack of Transportation (Non-Medical): Yes  Physical Activity: Sufficiently Active (12/10/2022)   Exercise Vital Sign    Days of Exercise per Week: 7 days    Minutes of Exercise per Session: 30 min  Recent Concern: Physical Activity - Inactive (09/24/2022)   Exercise Vital Sign     Days of Exercise per Week: 0 days    Minutes of Exercise per Session: 0 min  Stress: Not on file  Social Connections: Not on file    Review of systems General: negative for malaise, night sweats, fever, chills, weight loss Neck: Negative for lumps, goiter, pain and significant neck swelling Resp: Negative for cough, wheezing, dyspnea at rest CV: Negative for chest pain, leg swelling, palpitations, orthopnea GI: denies melena, hematochezia, nausea, vomiting, diarrhea, constipation, dysphagia, odyonophagia, early satiety or unintentional weight loss.  MSK: Negative for joint pain or swelling, back pain, and muscle pain. Derm: Negative for itching or rash Psych: Denies depression, anxiety, memory loss, confusion. No homicidal or suicidal ideation.  The remainder of the review of systems is noncontributory.  Physical Exam: There were no vitals taken for this visit. General:   Alert and oriented. No distress noted. Pleasant and cooperative.  Head:  Normocephalic and atraumatic. Eyes:  Conjuctiva clear without scleral icterus. Mouth:  Oral mucosa pink and moist. Good dentition. No lesions. Heart: Normal rate and rhythm, s1 and s2 heart sounds present.  Lungs: Clear lung sounds in all lobes. Respirations equal and unlabored. Abdomen:  +BS, soft, non-tender and non-distended. No rebound or guarding. No HSM or masses noted. Neurologic:  Alert and  oriented x4 Psych:  Alert and cooperative. Normal mood and affect.  Invalid input(s): "6 MONTHS"   ASSESSMENT: MANASWINI FATZINGER is a 77 y.o. female presenting today for follow up after colostomy takedown  Bowel perforation secondary to inflammatory colitis earlier this year, she underwent partial colectomy and colostomy in January.  She underwent colonoscopy in March and had colostomy takedown in May.  She is doing well since her surgery.  Maintained on MiraLAX and stool softeners at recommendation of  her surgeon, having at least 1 BM per day that  is soft.  She notes some fecal urgency after meals at times.  No rectal bleeding or melena.  She has some occasional right lower quadrant pain that is usually occurring upon movement.  Her abdominal exam today is unremarkable.  She inquired what she should be eating in her diet given history of diverticulitis.  Recommend she continue to avoid NSAIDs, diet high in fiber, can even add 1 tablespoon of Benefiber daily.  Should continue with good water intake, MiraLAX and stool softener daily as she is doing.   PLAN:  Continue with miralax an stool softener  2. Benefiber 1T daily  3. Continue to avoid NSAIDs   All questions were answered, patient verbalized understanding and is in agreement with plan as outlined above.   Follow Up: 1 year   Daaron Dimarco L. Jeanmarie Hubert, MSN, APRN, AGNP-C Adult-Gerontology Nurse Practitioner [facility]  I have reviewed the note and agree with the APP's assessment as described in this progress note  Katrinka Blazing, MD Gastroenterology and Hepatology [facility]

## 2023-02-01 NOTE — Patient Instructions (Signed)
Continue with miralax and stool softeners You can try adding 1T benefiber daily  As we discussed, Please avoid NSAIDs (advil, aleve, naproxen, goody powder, ibuprofen) as these can be very hard on your GI tract, causing inflammation, ulcers and damage to the lining of your GI tract.   Follow up 1 year  It was a pleasure to see you today. I want to create trusting relationships with patients and provide genuine, compassionate, and quality care. I truly value your feedback! please be on the lookout for a survey regarding your visit with me today. I appreciate your input about our visit and your time in completing this!    Geran Haithcock L. Jeanmarie Hubert, MSN, APRN, AGNP-C Adult-Gerontology Nurse Practitioner [facility]

## 2023-10-27 ENCOUNTER — Ambulatory Visit (INDEPENDENT_AMBULATORY_CARE_PROVIDER_SITE_OTHER): Admitting: Gastroenterology

## 2023-10-27 ENCOUNTER — Telehealth (INDEPENDENT_AMBULATORY_CARE_PROVIDER_SITE_OTHER): Payer: Self-pay

## 2023-10-27 ENCOUNTER — Encounter (INDEPENDENT_AMBULATORY_CARE_PROVIDER_SITE_OTHER): Payer: Self-pay | Admitting: Gastroenterology

## 2023-10-27 VITALS — BP 137/76 | HR 61 | Temp 97.8°F | Ht 62.0 in | Wt 108.7 lb

## 2023-10-27 DIAGNOSIS — K644 Residual hemorrhoidal skin tags: Secondary | ICD-10-CM

## 2023-10-27 DIAGNOSIS — N898 Other specified noninflammatory disorders of vagina: Secondary | ICD-10-CM | POA: Diagnosis not present

## 2023-10-27 NOTE — Telephone Encounter (Signed)
 Please send today's office visit to pcp Dr. Vaughn Pouch at [facility] per Patient request. Thank you,

## 2023-10-27 NOTE — Telephone Encounter (Signed)
 noted

## 2023-10-27 NOTE — Progress Notes (Signed)
 Toribio Fortune, M.D. Gastroenterology & Hepatology [facility] Gastroenterology [address] [place], [place] [address]  Primary Care Physician: Trudy Vaughn FALCON, MD [address] [place] [address]  I will communicate my assessment and recommendations to the referring MD via EMR.  Problems: Perianal lesion  History of Present Illness: Yvette Branch is a 78 y.o. female with past medical history of anxiety, arthritis, bowel obstruction, GERD, malnutrition, osteoporosis, diverticulitis complicated by colon perforation requiring colectomy and colostomy placement, vaginal and rectal prolapse, who presents for evaluation of perianal lesion.  The patient was last seen on 02/01/2023. At that time, the patient was continued on MiraLAX  and Benefiber.  Patient states she is having at least 2 bowel movements per day. She takes Miralax  and stool softeners daily to achieve this.  She reports that she has noticed new onset rash in between the vagina and her rectum for the last 3 days. She noticed an area with drainage and some scant blood. She has been using a cream called [employer] Proctosol (appears to be hydrocortisone ) and Boudreaux's Butt Paste for the last two days. She reports feeling better after starting this ointment.   The patient denies having any nausea, vomiting, fever, chills, melena, hematemesis, abdominal distention, abdominal pain, diarrhea, jaundice, pruritus or weight loss.  Last Colonoscopy:-06/2022 The examined portion of the ileum was normal.                           - One 3 mm polyp in the ascending colon, removed                            with a cold snare. Resected and retrieved.                           - Diverticulosis in the descending colon.                           - Non-bleeding internal hemorrhoids. A. COLON, ASCENDING, POLYPECTOMY:  - Tubular adenoma  - Negative for high-grade dysplasia or malignancy   Past Medical  History: Past Medical History:  Diagnosis Date   Abdominal abscess    Anxiety    Arthritis    Bowel obstruction (HCC)    Multiple   Complication of anesthesia    trouble waking up with colonoscopy2007   Degenerative arthritis of lumbar spine    Depression    GERD (gastroesophageal reflux disease)    Hot flashes, menopausal    Hypothyroidism    Intra-abdominal abscess post-procedure (HCC)    Malnutrition (HCC)    Osteoporosis    Rectal prolapse    Vaginal prolapse     Past Surgical History: Past Surgical History:  Procedure Laterality Date   ABDOMINAL HYSTERECTOMY     ABDOMINAL SURGERY     bowel obstruction     COLECTOMY WITH COLOSTOMY CREATION/HARTMANN PROCEDURE N/A 05/09/2022   Procedure: COLECTOMY WITH COLOSTOMY CREATION/HARTMANN PROCEDURE;  Surgeon: Kallie Manuelita BROCKS, MD;  Location: [facility];  Service: General;  Laterality: N/A;   COLONOSCOPY N/A 08/02/2014   Procedure: COLONOSCOPY;  Surgeon: Claudis RAYMOND Rivet, MD;  [facility];  Service: Endoscopy;  Laterality: N/A;  1240   COLONOSCOPY WITH PROPOFOL  N/A 07/08/2022   Procedure: COLONOSCOPY WITH PROPOFOL ;  Surgeon: Fortune Angelia Toribio, MD;  [facility];  Service: Gastroenterology;  Laterality: N/A;  1230pm, asa 3, via ostomy/rectal stump   COLOSTOMY REVERSAL N/A 09/10/2022   Procedure: COLOSTOMY REVERSAL;  Surgeon: Kallie Manuelita BROCKS, MD;  Location: [facility];  Service: General;  Laterality: N/A;   POLYPECTOMY  07/08/2022   Procedure: POLYPECTOMY;  Surgeon: Eartha Angelia Sieving, MD;  [facility];  Service: Gastroenterology;;   TONSILLECTOMY      Family History: Family History  Problem Relation Age of Onset   Cancer Mother        Throat   Cancer Sister        Leukemia    Social History: Social History   Tobacco Use  Smoking Status Never   Passive exposure: Past  Smokeless Tobacco Never   Social History   Substance and Sexual Activity  Alcohol  Use Yes   Alcohol /week:  0.0 standard drinks of alcohol    Comment: Occasional wine.   Social History   Substance and Sexual Activity  Drug Use No    Allergies: Allergies  Allergen Reactions   Doxycycline Other (See Comments)    Bad vision   Gabapentin      Side effects - did not like the way she felt.    Metronidazole  Hives, Diarrhea and Rash    Numbness in face    Medications: Current Outpatient Medications  Medication Sig Dispense Refill   ALPRAZolam  (XANAX ) 0.25 MG tablet Take 0.25 mg by mouth 2 (two) times daily as needed for anxiety.     aspirin EC 81 MG tablet Take 81 mg by mouth daily.     Calcium Carbonate-Vit D-Min (RA CALCIUM PLUS MINERALS/VIT D PO) Take 1 tablet by mouth daily.     calcium elemental as carbonate (TUMS ULTRA 1000) 400 MG chewable tablet Chew 1,000 mg by mouth daily as needed for heartburn.     Carboxymethylcellulose Sodium (THERATEARS EXTRA OP) Place 1 drop into both eyes 4 (four) times daily as needed (dry eyes).     docusate sodium  (COLACE) 100 MG capsule Take 1 capsule (100 mg total) by mouth 2 (two) times daily. (Patient taking differently: Take 200 mg by mouth daily.) 10 capsule 0   levothyroxine  (SYNTHROID , LEVOTHROID) 50 MCG tablet Take 50 mcg by mouth daily before breakfast.   4   Magnesium  400 MG TABS Take 400 mg by mouth 3 (three) times a week. (Patient taking differently: Take 400 mg by mouth. Two time per month)     Multiple Vitamins-Minerals (HAIR/SKIN/NAILS/BIOTIN) TABS Take 1 tablet by mouth daily.     Multiple Vitamins-Minerals (PRESERVISION AREDS PO) Take by mouth. 2 bid (Patient taking differently: Take by mouth. Tid)     neomycin -bacitracin-polymyxin (NEOSPORIN) OINT Apply 1 Application topically daily as needed for wound care.     nystatin -triamcinolone  (MYCOLOG II) cream Apply 1 Application topically 2 (two) times daily as needed (rash).     OVER THE COUNTER MEDICATION Vitamin for joint     polyethylene glycol (MIRALAX  / GLYCOLAX ) 17 g packet Take 17 g by  mouth at bedtime. For constipation 30 each 11   Probiotic Product (ALIGN) 4 MG CAPS Take 1 capsule by mouth daily. 30 capsule 0   pyridOXINE (VITAMIN B-6) 100 MG tablet Take 100 mg by mouth daily.     vitamin k 100 MCG tablet Take 100 mcg by mouth daily.     No current facility-administered medications for this visit.    Review of Systems: GENERAL: negative for malaise, night sweats HEENT: No changes in hearing or  vision, no nose bleeds or other nasal problems. NECK: Negative for lumps, goiter, pain and significant neck swelling RESPIRATORY: Negative for cough, wheezing CARDIOVASCULAR: Negative for chest pain, leg swelling, palpitations, orthopnea GI: SEE HPI MUSCULOSKELETAL: Negative for joint pain or swelling, back pain, and muscle pain. SKIN: Negative for lesions, rash PSYCH: Negative for sleep disturbance, mood disorder and recent psychosocial stressors. HEMATOLOGY Negative for prolonged bleeding, bruising easily, and swollen nodes. ENDOCRINE: Negative for cold or heat intolerance, polyuria, polydipsia and goiter. NEURO: negative for tremor, gait imbalance, syncope and seizures. The remainder of the review of systems is noncontributory.   Physical Exam: BP 137/76 (BP Location: Left Arm, Patient Position: Sitting, Cuff Size: Normal)   Pulse 61   Temp 97.8 F (36.6 C) (Temporal)   Ht 5' 2 (1.575 m)   Wt 108 lb 11.2 oz (49.3 kg)   BMI 19.88 kg/m  GENERAL: The patient is AO x3, in no acute distress. HEENT: Head is normocephalic and atraumatic. EOMI are intact. Mouth is well hydrated and without lesions. NECK: Supple. No masses LUNGS: Clear to auscultation. No presence of rhonchi/wheezing/rales. Adequate chest expansion HEART: RRR, normal s1 and s2. ABDOMEN: Soft, nontender, no guarding, no peritoneal signs, and nondistended. BS +. No masses. RECTAL EXAM: There is small external hemorrhoids, normal tone, no masses, brown stool without blood.  Notably, the patient showed me a  lesion located in the left inferior area of the vagina which was less than 1 cm in size and was rounded.  Chaperone: Glenys Bruns, CMA EXTREMITIES: Without any cyanosis, clubbing, rash, lesions or edema. NEUROLOGIC: AOx3, no focal motor deficit. SKIN: no jaundice, no rashes  Imaging/Labs: as above  I personally reviewed and interpreted the available labs, imaging and endoscopic files.  Impression and Plan: Yvette Branch is a 78 y.o. female with past medical history of anxiety, arthritis, bowel obstruction, GERD, malnutrition, osteoporosis, diverticulitis complicated by colon perforation requiring colectomy and colostomy placement, vaginal and rectal prolapse, who presents for evaluation of perianal lesion.  Physical exam today shows presence of rounded small lesion in the caudal area adjacent to the left side of the vagina.  There is no ongoing discharge or any other abnormalities.  Perianal exam only showed small hemorrhoids but no other abnormalities.  I discussed with the patient that this lesion may be better assessed by GYN doctor or a dermatologist.  However, I reassured her that the perianal area was looking normal.  However, if she presents rash with defecation, she can keep using the baby cream barrier she has been using.  -Please follow up closely with GYN and PCP regarding lesion close to the vagina -If having irritation after defecation, please restart using Boudreaux's Butt Paste regularly  All questions were answered.      Toribio Fortune, MD Gastroenterology and Hepatology [facility] Gastroenterology

## 2023-10-27 NOTE — Patient Instructions (Addendum)
 Please follow up closely with GYN and PCP regarding lesion close to the vagina If having irritation after defecation, please restart using Boudreaux's Butt Paste regularly

## 2023-12-08 ENCOUNTER — Other Ambulatory Visit (HOSPITAL_COMMUNITY): Payer: Self-pay | Admitting: Internal Medicine

## 2023-12-08 DIAGNOSIS — Z1231 Encounter for screening mammogram for malignant neoplasm of breast: Secondary | ICD-10-CM

## 2024-01-13 ENCOUNTER — Ambulatory Visit (HOSPITAL_COMMUNITY)

## 2024-01-23 ENCOUNTER — Ambulatory Visit (HOSPITAL_COMMUNITY)
Admission: RE | Admit: 2024-01-23 | Discharge: 2024-01-23 | Disposition: A | Discharge status: Home / Self Care | Source: Ambulatory Visit | Attending: Internal Medicine | Admitting: Internal Medicine

## 2024-01-23 DIAGNOSIS — Z1231 Encounter for screening mammogram for malignant neoplasm of breast: Secondary | ICD-10-CM | POA: Diagnosis present

## 2024-01-25 ENCOUNTER — Encounter (INDEPENDENT_AMBULATORY_CARE_PROVIDER_SITE_OTHER): Payer: Self-pay | Admitting: Gastroenterology

## 2024-01-26 ENCOUNTER — Encounter (HOSPITAL_COMMUNITY): Payer: Self-pay | Admitting: Internal Medicine

## 2024-02-02 ENCOUNTER — Encounter (INDEPENDENT_AMBULATORY_CARE_PROVIDER_SITE_OTHER): Payer: Self-pay | Admitting: Gastroenterology

## 2024-02-02 ENCOUNTER — Ambulatory Visit (INDEPENDENT_AMBULATORY_CARE_PROVIDER_SITE_OTHER): Payer: Medicare Other | Admitting: Gastroenterology

## 2024-02-02 VITALS — BP 133/73 | HR 73 | Temp 98.1°F | Ht 62.0 in | Wt 106.4 lb

## 2024-02-02 DIAGNOSIS — Z860101 Personal history of adenomatous and serrated colon polyps: Secondary | ICD-10-CM

## 2024-02-02 DIAGNOSIS — K5900 Constipation, unspecified: Secondary | ICD-10-CM | POA: Diagnosis not present

## 2024-02-02 NOTE — Progress Notes (Addendum)
 Referring Provider: Marvine Rush, MD Primary Care Physician:  Trudy Vaughn FALCON, MD Primary GI Physician: Dr. Eartha   Chief Complaint  Patient presents with   Follow-up    Pt arrives for follow up. Pt states no issues/concerns at this time. Pt saw Dr.Castaneda in July for an area on her vagina and states that has resolved. Dr.Castanda referred her to a GYN, pt would like that taken off her chart since area has resolved.    HPI:   Yvette Branch is a 78 y.o. female with past medical history of  anxiety, arthritis, bowel obstruction, GERD, malnutrition, osteoporosis, diverticulitis complicated by colon perforation requiring colectomy and colostomy placement, vaginal and rectal prolapse   Patient presenting today for:  Follow up of constipation   Last seen in July by Dr. Eartha, at that time reported a rash between her vagina and rectum with some drainage, scant blood.  Recommended she follow up with GYN, can continue [employer]'s Butt Paste PRN  Present:  States she is doing well, no abdominal pain. Having good BMs with no constipation. She is doing stool softener and miralax  daily. No rectal bleeding or melena. Appetite is good. Eating most anything with out issue    She states that lesion/rash she presented with last time has resolved. She thinks this came from new undergarments she had bought. She saw PCP who wanted her to See GYN as well and she does not wish to as her symptoms have cleared up, She is requesting this recommendation to see GYN be taken out of her chart.    Last Colonoscopy:-06/2022 The examined portion of the ileum was normal.                           - One 3 mm polyp in the ascending colon, removed                            with a cold snare. Resected and retrieved.                           - Diverticulosis in the descending colon.                           - Non-bleeding internal hemorrhoids. A. COLON, ASCENDING, POLYPECTOMY:  - Tubular adenoma  -  Negative for high-grade dysplasia or malignancy    Filed Weights   02/02/24 1448  Weight: 106 lb 6.4 oz (48.3 kg)     Past Medical History:  Diagnosis Date   Abdominal abscess    Anxiety    Arthritis    Bowel obstruction (HCC)    Multiple   Complication of anesthesia    trouble waking up with colonoscopy2007   Degenerative arthritis of lumbar spine    Depression    GERD (gastroesophageal reflux disease)    Hot flashes, menopausal    Hypothyroidism    Intra-abdominal abscess post-procedure (HCC)    Malnutrition    Osteoporosis    Rectal prolapse    Vaginal prolapse     Past Surgical History:  Procedure Laterality Date   ABDOMINAL HYSTERECTOMY     ABDOMINAL SURGERY     bowel obstruction     COLECTOMY WITH COLOSTOMY CREATION/HARTMANN PROCEDURE N/A 05/09/2022   Procedure: COLECTOMY WITH COLOSTOMY CREATION/HARTMANN PROCEDURE;  Surgeon: Kallie Manuelita BROCKS, MD;  Location:  [facility];  Service: General;  Laterality: N/A;   COLONOSCOPY N/A 08/02/2014   Procedure: COLONOSCOPY;  Surgeon: Claudis RAYMOND Rivet, MD;  Location: [facility];  Service: Endoscopy;  Laterality: N/A;  1240   COLONOSCOPY WITH PROPOFOL  N/A 07/08/2022   Procedure: COLONOSCOPY WITH PROPOFOL ;  Surgeon: Eartha Angelia Sieving, MD;  Location: [facility];  Service: Gastroenterology;  Laterality: N/A;  1230pm, asa 3, via ostomy/rectal stump   COLOSTOMY REVERSAL N/A 09/10/2022   Procedure: COLOSTOMY REVERSAL;  Surgeon: Kallie Manuelita BROCKS, MD;  Location: [facility];  Service: General;  Laterality: N/A;   POLYPECTOMY  07/08/2022   Procedure: POLYPECTOMY;  Surgeon: Eartha Angelia, Sieving, MD;  Location: [facility];  Service: Gastroenterology;;   TONSILLECTOMY      Current Outpatient Medications  Medication Sig Dispense Refill   ALPRAZolam  (XANAX ) 0.25 MG tablet Take 0.25 mg by mouth 2 (two) times daily as needed for anxiety.     aspirin EC 81 MG tablet Take 81 mg by mouth daily.     Calcium Carbonate-Vit D-Min (RA  CALCIUM PLUS MINERALS/VIT D PO) Take 1 tablet by mouth daily.     calcium elemental as carbonate (TUMS ULTRA 1000) 400 MG chewable tablet Chew 1,000 mg by mouth daily as needed for heartburn.     Carboxymethylcellulose Sodium (THERATEARS EXTRA OP) Place 1 drop into both eyes 4 (four) times daily as needed (dry eyes).     docusate sodium  (COLACE) 100 MG capsule Take 1 capsule (100 mg total) by mouth 2 (two) times daily. (Patient taking differently: Take 200 mg by mouth daily.) 10 capsule 0   levothyroxine  (SYNTHROID , LEVOTHROID) 50 MCG tablet Take 50 mcg by mouth daily before breakfast.   4   Magnesium  400 MG TABS Take 400 mg by mouth 3 (three) times a week. (Patient taking differently: Take 400 mg by mouth. Two time per month)     Multiple Vitamins-Minerals (HAIR/SKIN/NAILS/BIOTIN) TABS Take 1 tablet by mouth daily.     Multiple Vitamins-Minerals (PRESERVISION AREDS PO) Take by mouth. 2 bid (Patient taking differently: Take by mouth. Tid)     neomycin -bacitracin-polymyxin (NEOSPORIN) OINT Apply 1 Application topically daily as needed for wound care.     nystatin -triamcinolone  (MYCOLOG II) cream Apply 1 Application topically 2 (two) times daily as needed (rash).     Omega-3 Fatty Acids (ULTRA OMEGA-3 FISH OIL PO) Take by mouth.     OVER THE COUNTER MEDICATION Vitamin for joint     polyethylene glycol (MIRALAX  / GLYCOLAX ) 17 g packet Take 17 g by mouth at bedtime. For constipation 30 each 11   Probiotic Product (ALIGN) 4 MG CAPS Take 1 capsule by mouth daily. 30 capsule 0   pyridOXINE (VITAMIN B-6) 100 MG tablet Take 100 mg by mouth daily.     vitamin k 100 MCG tablet Take 100 mcg by mouth daily.     No current facility-administered medications for this visit.    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   Drug use: No   Sexual activity: Yes    Birth control/protection: Surgical  Other Topics Concern   Not on file  Social History Narrative   Single.  Unmarried, no children.  Lives alone. Independent of ADLs, ambulation.   Social Drivers of Corporate investment banker Strain: Low Risk  (10/01/2022)   Overall Financial Resource Strain (CARDIA)    Difficulty of Paying Living Expenses: Not very hard  Food Insecurity: Food Insecurity Present (11/10/2022)   Hunger Vital Sign    Worried About Running Out of Food in the Last Year: Sometimes true    Ran Out of Food in the Last Year: Sometimes true  Transportation Needs: No Transportation Needs (12/10/2022)   PRAPARE - Administrator, Civil Service (Medical): No    Lack of Transportation (Non-Medical): No  Recent Concern: Transportation Needs - Unmet Transportation Needs (11/10/2022)   PRAPARE - Transportation    Lack of Transportation (Medical): Yes    Lack of Transportation (Non-Medical): Yes  Physical Activity: Sufficiently Active (12/10/2022)   Exercise Vital Sign    Days of Exercise per Week: 7 days    Minutes of Exercise per Session: 30 min  Recent Concern: Physical Activity - Inactive (09/24/2022)   Exercise Vital Sign    Days of Exercise per Week: 0 days    Minutes of Exercise per Session: 0 min  Stress: Not on file  Social Connections: Not on file    Review of systems General: negative for malaise, night sweats,  fever, chills, weight loss Neck: Negative for lumps, goiter, pain and significant neck swelling Resp: Negative for cough, wheezing, dyspnea at rest CV: Negative for chest pain, leg swelling, palpitations, orthopnea GI: denies melena, hematochezia, nausea, vomiting, diarrhea, constipation, dysphagia, odyonophagia, early satiety or unintentional weight loss.  MSK: Negative for joint pain or swelling, back pain, and muscle pain. Derm: Negative for itching or rash Psych: Denies depression, anxiety, memory loss, confusion. No homicidal or suicidal ideation.  Heme: Negative for prolonged bleeding, bruising easily, and swollen nodes. Endocrine: Negative for cold or heat intolerance, polyuria, polydipsia and goiter. Neuro: negative for tremor, gait imbalance, syncope and seizures. The remainder of the review of systems is noncontributory.  Physical Exam: BP 133/73   Pulse 73   Temp 98.1 F (36.7 C)   Ht 5' 2 (1.575 m)   Wt 106 lb 6.4 oz (48.3 kg)   BMI 19.46 kg/m  General:   Alert and oriented. No distress noted. Pleasant and cooperative.  Head:  Normocephalic and atraumatic. Eyes:  Conjuctiva clear without scleral icterus. Mouth:  Oral mucosa pink and moist. Good dentition. No lesions. Heart: Normal rate and rhythm, s1 and s2 heart sounds present.  Lungs: Clear lung sounds in all lobes. Respirations equal and unlabored. Abdomen:  +BS, soft, non-tender and non-distended. Midline scar noted at umbilicus. No rebound or guarding. No HSM or masses noted. Derm: No palmar erythema or jaundice Msk:  Symmetrical without gross deformities. Normal posture. Extremities:  Without edema. Neurologic:  Alert and  oriented x4 Psych:  Alert and cooperative. Normal mood and affect.  Invalid input(s): 6 MONTHS   ASSESSMENT: Yvette Branch is a 78 y.o. 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  She underwent colonoscopy in March 2024 and had colostomy takedown in May 2024.  Has done well since surgery,  Maintained on MiraLAX  and stool softeners at recommendation of her surgeon, having at least 1 BM per day that is soft. No issues with constipation, rectal bleeding or melena.   Patient does request that recommendations from last Visit with Dr. Eartha to see GYN be removed from her chart as she is no longer having issues and her PCP wanted her to see GYN given these recommendations. I discussed with her that this was Dr. Samuel recommendation at that time and that we cannot remove documentation of his assessment and recommendations. Patient verbalized understanding.    PLAN:  -continue current bowel regimen -Increase water  intake, aim for atleast 64 oz per day -Increase fruits, veggies and whole grains, kiwi and prunes are especially good for constipation -avoid NSAIDs  All questions were answered, patient verbalized understanding and is in agreement with plan as outlined above.   Follow Up: PRN   Anaisa Radi L. Mariette, MSN, APRN, AGNP-C Adult-Gerontology Nurse Practitioner [facility]  I have reviewed the note and agree with the APP's assessment as described in this progress note  Toribio Eartha, MD Gastroenterology and Hepatology [facility]

## 2024-02-02 NOTE — Patient Instructions (Signed)
 I'm glad you are doing well! As discussed you can continue to eat what you tolerate, try to avoid use of NSAID type medications  (advil, aleve, naproxen, goody powder, ibuprofen). Continue with miralax  and stool softener as well as good water  intake

## 2024-02-23 ENCOUNTER — Telehealth (INDEPENDENT_AMBULATORY_CARE_PROVIDER_SITE_OTHER): Payer: Self-pay | Admitting: Gastroenterology

## 2024-02-23 ENCOUNTER — Other Ambulatory Visit (INDEPENDENT_AMBULATORY_CARE_PROVIDER_SITE_OTHER): Payer: Self-pay | Admitting: Gastroenterology

## 2024-02-23 MED ORDER — NYSTATIN-TRIAMCINOLONE 100000-0.1 UNIT/GM-% EX CREA: 1.0000 | TOPICAL_CREAM | Freq: Two times a day (BID) | CUTANEOUS | 1 refills | Status: AC | PRN

## 2024-02-23 NOTE — Telephone Encounter (Signed)
 Per OV with Mitzie Boettcher, Np on 02/02/2024:  ASSESSMENT: Yvette Branch is a 78 y.o. female presenting today for follow up of constipation and history of bowel perforation   history of Bowel perforation secondary to inflammatory colitis in early 2024, she underwent partial colectomy and colostomy in January 2024.  She underwent colonoscopy in March 2024 and had colostomy takedown in May 2024.  Has done well since surgery,  Maintained on MiraLAX  and stool softeners at recommendation of her surgeon, having at least 1 BM per day that is soft. No issues with constipation, rectal bleeding or melena.    Patient does request that recommendations from last Visit with Dr. Eartha to see GYN be removed from her chart as she is no longer having issues and her PCP wanted her to see GYN given these recommendations. I discussed with her that this was Dr. Samuel recommendation at that time and that we cannot remove documentation of his assessment and recommendations. Patient verbalized understanding.      PLAN:  -continue current bowel regimen -Increase water  intake, aim for atleast 64 oz per day -Increase fruits, veggies and whole grains, kiwi and prunes are especially good for constipation -avoid NSAIDs   All questions were answered, patient verbalized understanding and is in agreement with plan as outlined above.    Follow Up: PRN    Chelsea L. Carlan, MSN, APRN, AGNP-C Adult-Gerontology Nurse Practitioner [facility]

## 2024-02-23 NOTE — Telephone Encounter (Signed)
 I spoke with the patient and she says on two occasions 02/19/2024 and 02/22/2024 after urinating she has seen blood on tissue the size of a dime, both times. She says the stools do not have any blood in them. She says she has some rectal itching, denies rectal pain, has a history of a rectal tag. Would like to know if we could possibly send in Nystatin  & Triamcinolone  acetonide cream, we had given her in the past, as she had some of this left over and it has helped. She also wondered if not that cream, maybe we could send in Proctosol HC 2.5 % cream. She uses [facility]. I did let her know that [facility] does have a compounded cream, that we call in for hemorrhoids that is good,but she would have to pay cash price of $ 50.00 for this. She says she would prefer not to have to pay out of pocket for the medication if she could help it. (Patient last seen 02/02/2024 by Mitzie Boettcher, Np) next visit is next Thursday 03/01/2024. Please advise.

## 2024-02-23 NOTE — Telephone Encounter (Signed)
 I spoke with the patient and made her aware per Mitzie Boettcher, Np, I have sent the cream, if she develops worsening bleeding, changes in bowel habits or abdominal pain she needs to let me know or if over the weekend needs to proceed to the ED for further evaluation given her previous history of diverticulitis/perforation. Patient states understanding and says, Thank You!

## 2024-02-23 NOTE — Telephone Encounter (Signed)
 Patient had her one year follow up on 02/02/2024 with Mitzie Boettcher, NP. Today she comes to front desk asking to see Dr Eartha ASAP in the afternoon, I offered her OV for 12/8, but she can't wait that long. I made her OV to see Chelsea on 11/20. She is seeing blood when she wipes and asked if we would call in a prescription of Nystatin  +Triamcindone Acetonde Cream. Please advise if she needs this OV or not. [phone]

## 2024-02-23 NOTE — Telephone Encounter (Signed)
 I called and left a voice message, asked that the patient please return call to the office.

## 2024-03-01 ENCOUNTER — Ambulatory Visit (INDEPENDENT_AMBULATORY_CARE_PROVIDER_SITE_OTHER): Admitting: Gastroenterology

## 2024-03-01 ENCOUNTER — Encounter (INDEPENDENT_AMBULATORY_CARE_PROVIDER_SITE_OTHER): Payer: Self-pay | Admitting: Gastroenterology

## 2024-03-01 VITALS — BP 127/67 | HR 60 | Temp 98.2°F | Ht 62.0 in | Wt 106.2 lb

## 2024-03-01 DIAGNOSIS — B3789 Other sites of candidiasis: Secondary | ICD-10-CM | POA: Diagnosis not present

## 2024-03-01 DIAGNOSIS — K625 Hemorrhage of anus and rectum: Secondary | ICD-10-CM | POA: Diagnosis not present

## 2024-03-01 NOTE — Progress Notes (Addendum)
 Referring Provider: Trudy Vaughn FALCON, MD Primary Care Physician:  Trudy Vaughn FALCON, MD Primary GI Physician: Dr. Eartha   Chief Complaint  Patient presents with   Rectal Bleeding    Pt arrives due to blood when wiping. Believes it may be hemorrhoids. Has been straining and bowels are more bulkier than normal. Had blood when wiping on 02/20/24 and 02/22/24-bright red about the size of a [place]. No blood in stool or in toilet. Hemorrhoid cream has worked.   HPI:   Yvette Branch is a 78 y.o. female with past medical history of anxiety, arthritis, bowel obstruction, GERD, malnutrition, osteoporosis, diverticulitis complicated by colon perforation requiring colectomy and colostomy placement, vaginal and rectal prolapse   Patient presenting today for:  Rectal bleeding   Last seen in October, at that time doing well, no abdominal pain or constipation.  Recommended continue current bowel regimen, good water  intake, high fiber diet  Present:  States she had a spot of bright blood on toilet tissue on 11/10 and 11/12, she denies any blood in stools or toilet. She notes that prior to this she had some harder/bulkier stools with straining prior to this. She uses a training and development officer.  She had called and requested mycolog which she had used in the past with Dr. Golda. she started using this and has not seen any further bleeding. She notes an occasional itching/burning in rectal area. Taking miralax  1 capful daily, two stool softeners daily. she does also eat fiber one, water  intake is good. She has no abdominal pain. No nausea, vomiting or changes in appetite.   Last Colonoscopy:06/2022 The examined portion of the ileum was normal.                           - One 3 mm polyp in the ascending colon, removed                            with a cold snare. Resected and retrieved.                           - Diverticulosis in the descending colon.                           - Non-bleeding internal  hemorrhoids. A. COLON, ASCENDING, POLYPECTOMY:  - Tubular adenoma  - Negative for high-grade dysplasia or malignancy   Filed Weights   03/01/24 1454  Weight: 106 lb 3.2 oz (48.2 kg)     Past Medical History:  Diagnosis Date   Abdominal abscess    Anxiety    Arthritis    Bowel obstruction (HCC)    Multiple   Complication of anesthesia    trouble waking up with colonoscopy2007   Degenerative arthritis of lumbar spine    Depression    GERD (gastroesophageal reflux disease)    Hot flashes, menopausal    Hypothyroidism    Intra-abdominal abscess post-procedure (HCC)    Malnutrition    Osteoporosis    Rectal prolapse    Vaginal prolapse     Past Surgical History:  Procedure Laterality Date   ABDOMINAL HYSTERECTOMY     ABDOMINAL SURGERY     bowel obstruction     COLECTOMY WITH COLOSTOMY CREATION/HARTMANN PROCEDURE N/A 05/09/2022   Procedure: COLECTOMY WITH COLOSTOMY CREATION/HARTMANN PROCEDURE;  Surgeon: Kallie Manuelita BROCKS, MD;  Location: [facility];  Service: General;  Laterality: N/A;   COLONOSCOPY N/A 08/02/2014   Procedure: COLONOSCOPY;  Surgeon: Claudis RAYMOND Rivet, MD;  Location: [facility];  Service: Endoscopy;  Laterality: N/A;  1240   COLONOSCOPY WITH PROPOFOL  N/A 07/08/2022   Procedure: COLONOSCOPY WITH PROPOFOL ;  Surgeon: Eartha Angelia Sieving, MD;  Location: [facility];  Service: Gastroenterology;  Laterality: N/A;  1230pm, asa 3, via ostomy/rectal stump   COLOSTOMY REVERSAL N/A 09/10/2022   Procedure: COLOSTOMY REVERSAL;  Surgeon: Kallie Manuelita BROCKS, MD;  Location: [facility];  Service: General;  Laterality: N/A;   POLYPECTOMY  07/08/2022   Procedure: POLYPECTOMY;  Surgeon: Eartha Angelia, Sieving, MD;  Location: [facility];  Service: Gastroenterology;;   TONSILLECTOMY      Current Outpatient Medications  Medication Sig Dispense Refill   ALPRAZolam  (XANAX ) 0.25 MG tablet Take 0.25 mg by mouth 2 (two) times daily as needed for anxiety.     aspirin EC 81 MG  tablet Take 81 mg by mouth daily.     Calcium Carbonate-Vit D-Min (RA CALCIUM PLUS MINERALS/VIT D PO) Take 1 tablet by mouth daily.     calcium elemental as carbonate (TUMS ULTRA 1000) 400 MG chewable tablet Chew 1,000 mg by mouth daily as needed for heartburn.     Carboxymethylcellulose Sodium (THERATEARS EXTRA OP) Place 1 drop into both eyes 4 (four) times daily as needed (dry eyes).     docusate sodium  (COLACE) 100 MG capsule Take 1 capsule (100 mg total) by mouth 2 (two) times daily. (Patient taking differently: Take 200 mg by mouth daily.) 10 capsule 0   levothyroxine  (SYNTHROID , LEVOTHROID) 50 MCG tablet Take 50 mcg by mouth daily before breakfast.   4   Magnesium  400 MG TABS Take 400 mg by mouth 3 (three) times a week. (Patient taking differently: Take 400 mg by mouth. Two time per month)     Multiple Vitamins-Minerals (HAIR/SKIN/NAILS/BIOTIN) TABS Take 1 tablet by mouth daily.     Multiple Vitamins-Minerals (PRESERVISION AREDS PO) Take by mouth. 2 bid (Patient taking differently: Take by mouth. Tid)     neomycin -bacitracin-polymyxin (NEOSPORIN) OINT Apply 1 Application topically daily as needed for wound care.     nystatin -triamcinolone  (MYCOLOG II) cream Apply 1 Application topically 2 (two) times daily as needed (rash). 30 g 1   Omega-3 Fatty Acids (ULTRA OMEGA-3 FISH OIL PO) Take by mouth.     OVER THE COUNTER MEDICATION Vitamin for joint     polyethylene glycol (MIRALAX  / GLYCOLAX ) 17 g packet Take 17 g by mouth at bedtime. For constipation 30 each 11   Probiotic Product (ALIGN) 4 MG CAPS Take 1 capsule by mouth daily. 30 capsule 0   pyridOXINE (VITAMIN B-6) 100 MG tablet Take 100 mg by mouth daily.     vitamin k 100 MCG tablet Take 100 mcg by mouth daily.     No current facility-administered medications for this visit.    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   Drug use: No   Sexual activity: Yes    Birth control/protection: Surgical  Other Topics Concern   Not on file  Social History Narrative   Single.  Unmarried, no children.  Lives alone. Independent of ADLs, ambulation.   Social Drivers of Corporate Investment Banker Strain: Low Risk  (10/01/2022)   Overall Financial Resource Strain (CARDIA)    Difficulty of Paying Living Expenses: Not very hard  Food Insecurity: Food Insecurity Present (11/10/2022)   Hunger Vital Sign    Worried About Running Out of Food in the Last Year: Sometimes true    Ran Out of Food in the Last Year: Sometimes true  Transportation Needs: No Transportation Needs (12/10/2022)   PRAPARE - Administrator, Civil Service (Medical): No    Lack of Transportation (Non-Medical): No  Recent Concern: Transportation Needs - Unmet Transportation Needs (11/10/2022)   PRAPARE - Transportation    Lack of Transportation (Medical): Yes    Lack of Transportation (Non-Medical): Yes  Physical Activity: Sufficiently Active (12/10/2022)   Exercise Vital Sign    Days of Exercise per Week: 7 days    Minutes of Exercise per Session: 30 min  Recent Concern: Physical Activity - Inactive (09/24/2022)   Exercise Vital Sign    Days of Exercise per Week: 0 days    Minutes of Exercise per Session: 0 min  Stress: Not on file  Social Connections: Not on file     Review of systems General: negative for malaise, night sweats, fever, chills, weight loss Neck: Negative for lumps, goiter, pain and significant neck swelling Resp: Negative for cough, wheezing, dyspnea at rest CV: Negative for chest pain, leg swelling, palpitations, orthopnea GI: denies melena, nausea, vomiting, diarrhea, constipation, dysphagia, odyonophagia, early satiety or unintentional weight loss. +rectal bleeding  MSK: Negative for joint pain or swelling, back pain, and muscle pain. Derm: Negative for itching or rash Psych: Denies depression, anxiety, memory loss, confusion. No homicidal or suicidal ideation.  Heme: Negative for prolonged bleeding, bruising easily, and swollen nodes. Endocrine: Negative for cold or heat intolerance, polyuria, polydipsia and goiter. Neuro: negative for tremor, gait imbalance, syncope and seizures. The remainder of the review of systems is noncontributory.  Physical Exam: BP 127/67   Pulse 60   Temp 98.2 F (36.8 C)   Ht 5' 2 (1.575 m)   Wt 106 lb 3.2 oz (48.2 kg)   BMI 19.42 kg/m  General:   Alert and oriented. No distress noted. Pleasant and cooperative.  Head:  Normocephalic and atraumatic. Eyes:  Conjuctiva clear without scleral icterus. Mouth:  Oral mucosa pink and moist. Good dentition. No lesions. Heart: Normal rate and rhythm, s1 and s2 heart sounds present.  Lungs: Clear lung sounds in all lobes. Respirations equal and unlabored. Abdomen:  +BS, soft, non-tender and non-distended. No rebound or guarding. No HSM or masses noted. Rectal: glenys bruns LPN present as witness, anal skin tag noted, no other obvious lesions, masses, fissures externally, some erythema of perianal area, likely candida, no obvious masses or lesions felt on DRE with only distal rectal evaluation, sphincter tone WNL Derm: No palmar erythema or jaundice Msk:  Symmetrical without gross deformities. Normal posture. Extremities:  Without  edema. Neurologic:   Alert and  oriented x4 Psych:  Alert and cooperative. Normal mood and affect.  Invalid input(s): 6 MONTHS   ASSESSMENT: Yvette Branch is a 78 y.o. female presenting today for rectal bleeding   Toilet tissue hematochezia x2 in setting of larger volume stools. No further bleeding since starting mycolog which she requested as she has had good results with this in the past. Relatively recent Colonoscopy in early 2024 as above with a polyp, diverticulosis, hemorrhoids. Rectal exam as above with some mild erythema of perianal area, likely candida. Recommend continuing current bowel regimen but increasing miralax  if stools are harder/bulkier, avoiding straining, limiting toilet time and continue mycolog BID x7 more days. She should make me aware of any further rectal bleeding.    PLAN:  -avoid straining -can continue mycolog BID x 7 more days -continue miralax  1 capful daily and stool softener BID, increase to miralax  BID if stools bulkier, needing to strain to defecate -Increase water  intake, aim for atleast 64 oz per day -Increase fruits, veggies and whole grains, kiwi and prunes are especially good for constipation -pt to make me aware of heavier bleeding/abdominal pain   All questions were answered, patient verbalized understanding and is in agreement with plan as outlined above.    Follow Up: 3 months   Jashon Ishida L. Mariette, MSN, APRN, AGNP-C Adult-Gerontology Nurse Practitioner [facility]  I have reviewed the note and agree with the APP's assessment as described in this progress note  Toribio Fortune, MD Gastroenterology and Hepatology [facility]

## 2024-03-01 NOTE — Patient Instructions (Addendum)
-  avoid straining and avoid sitting for long periods on toilet  -can continue mycolog cream twice daily x 7 more days -continue miralax  1 capful daily and stool softener twice daily, increase to miralax  to 1 capful twice daily if stools bulkier, needing to strain -aim for atleast 64 oz water  per day Increase fruits, veggies and whole grains, kiwi and prunes are especially good for constipation -make me aware of heavier bleeding/abdominal pain   Follow up 3 months  It was a pleasure to see you today. I want to create trusting relationships with patients and provide genuine, compassionate, and quality care. I truly value your feedback! please be on the lookout for a survey regarding your visit with me today. I appreciate your input about our visit and your time in completing this!    Kimberlye Dilger L. Julyan Gales, MSN, APRN, AGNP-C Adult-Gerontology Nurse Practitioner [facility] at [facility]

## 2024-03-05 DIAGNOSIS — B3789 Other sites of candidiasis: Secondary | ICD-10-CM | POA: Insufficient documentation
